# Patient Record
Sex: Female | Born: 1955 | Race: Black or African American | Hispanic: No | Marital: Married | Smoking: Never smoker
Health system: Southern US, Community
[De-identification: ages and names within clinical notes are randomized; demographics above are authoritative.]

## PROBLEM LIST (undated history)

## (undated) DIAGNOSIS — I10 Essential (primary) hypertension: Secondary | ICD-10-CM

## (undated) HISTORY — PX: COLONOSCOPY: SHX174

## (undated) HISTORY — DX: Essential (primary) hypertension: I10

## (undated) HISTORY — PX: BREAST BIOPSY: SHX20

---

## 1990-10-03 HISTORY — PX: TUBAL LIGATION: SHX77

## 1998-12-03 ENCOUNTER — Emergency Department (HOSPITAL_COMMUNITY): Admission: EM | Admit: 1998-12-03 | Discharge: 1998-12-03 | Payer: Self-pay | Admitting: Emergency Medicine

## 1998-12-03 ENCOUNTER — Encounter: Payer: Self-pay | Admitting: Emergency Medicine

## 2000-03-07 ENCOUNTER — Encounter: Payer: Self-pay | Admitting: Family Medicine

## 2000-03-07 ENCOUNTER — Ambulatory Visit (HOSPITAL_COMMUNITY): Admission: RE | Admit: 2000-03-07 | Discharge: 2000-03-07 | Payer: Self-pay | Admitting: Family Medicine

## 2000-03-15 ENCOUNTER — Encounter: Payer: Self-pay | Admitting: Family Medicine

## 2000-03-15 ENCOUNTER — Encounter: Admission: RE | Admit: 2000-03-15 | Discharge: 2000-03-15 | Payer: Self-pay | Admitting: Family Medicine

## 2003-03-05 ENCOUNTER — Ambulatory Visit (HOSPITAL_COMMUNITY): Admission: RE | Admit: 2003-03-05 | Discharge: 2003-03-05 | Payer: Self-pay | Admitting: Family Medicine

## 2003-03-05 ENCOUNTER — Encounter: Payer: Self-pay | Admitting: Family Medicine

## 2004-03-15 ENCOUNTER — Ambulatory Visit (HOSPITAL_COMMUNITY): Admission: RE | Admit: 2004-03-15 | Discharge: 2004-03-15 | Payer: Self-pay | Admitting: Family Medicine

## 2004-06-03 ENCOUNTER — Ambulatory Visit (HOSPITAL_COMMUNITY): Admission: RE | Admit: 2004-06-03 | Discharge: 2004-06-03 | Payer: Self-pay | Admitting: Oncology

## 2004-10-27 ENCOUNTER — Ambulatory Visit: Payer: Self-pay | Admitting: Family Medicine

## 2004-11-09 ENCOUNTER — Ambulatory Visit: Payer: Self-pay | Admitting: Family Medicine

## 2004-11-23 ENCOUNTER — Ambulatory Visit: Payer: Self-pay | Admitting: Family Medicine

## 2005-03-17 ENCOUNTER — Ambulatory Visit (HOSPITAL_COMMUNITY): Admission: RE | Admit: 2005-03-17 | Discharge: 2005-03-17 | Payer: Self-pay | Admitting: Family Medicine

## 2005-03-29 ENCOUNTER — Ambulatory Visit: Payer: Self-pay | Admitting: Family Medicine

## 2005-12-08 ENCOUNTER — Ambulatory Visit: Payer: Self-pay | Admitting: Family Medicine

## 2006-03-21 ENCOUNTER — Ambulatory Visit: Payer: Self-pay | Admitting: Internal Medicine

## 2006-03-30 ENCOUNTER — Encounter: Payer: Self-pay | Admitting: Family Medicine

## 2006-03-30 ENCOUNTER — Other Ambulatory Visit: Admission: RE | Admit: 2006-03-30 | Discharge: 2006-03-30 | Payer: Self-pay | Admitting: Family Medicine

## 2006-03-30 ENCOUNTER — Ambulatory Visit: Payer: Self-pay | Admitting: Family Medicine

## 2006-04-11 ENCOUNTER — Encounter (HOSPITAL_COMMUNITY): Admission: RE | Admit: 2006-04-11 | Discharge: 2006-05-17 | Payer: Self-pay | Admitting: Family Medicine

## 2006-06-06 ENCOUNTER — Ambulatory Visit: Payer: Self-pay | Admitting: Family Medicine

## 2006-07-18 ENCOUNTER — Ambulatory Visit: Payer: Self-pay | Admitting: Family Medicine

## 2006-07-20 ENCOUNTER — Ambulatory Visit (HOSPITAL_COMMUNITY): Admission: RE | Admit: 2006-07-20 | Discharge: 2006-07-20 | Payer: Self-pay | Admitting: Family Medicine

## 2006-08-08 ENCOUNTER — Encounter: Admission: RE | Admit: 2006-08-08 | Discharge: 2006-08-08 | Payer: Self-pay | Admitting: Family Medicine

## 2006-08-29 ENCOUNTER — Ambulatory Visit: Payer: Self-pay | Admitting: Family Medicine

## 2006-10-10 ENCOUNTER — Ambulatory Visit: Payer: Self-pay | Admitting: Family Medicine

## 2007-01-08 ENCOUNTER — Ambulatory Visit: Payer: Self-pay | Admitting: Family Medicine

## 2007-01-08 LAB — CONVERTED CEMR LAB
Basophils Absolute: 0 10*3/uL (ref 0.0–0.1)
Basophils Relative: 1 % (ref 0–1)
Ferritin: 9 ng/mL — ABNORMAL LOW (ref 10–291)
HCT: 36.1 % (ref 36.0–46.0)
Iron: 45 ug/dL (ref 42–145)
Lymphs Abs: 1.6 10*3/uL (ref 0.7–3.3)
MCHC: 31.9 g/dL (ref 30.0–36.0)
Neutro Abs: 1.8 10*3/uL (ref 1.7–7.7)
Neutrophils Relative %: 45 % (ref 43–77)
Platelets: 261 10*3/uL (ref 150–400)
RDW: 14 % (ref 11.5–14.0)
Retic Ct Pct: 1.7 % (ref 0.4–3.1)
Saturation Ratios: 12 % — ABNORMAL LOW (ref 20–55)
Vitamin B-12: 583 pg/mL (ref 211–911)

## 2007-01-30 ENCOUNTER — Encounter (HOSPITAL_COMMUNITY): Admission: RE | Admit: 2007-01-30 | Discharge: 2007-03-01 | Payer: Self-pay | Admitting: Oncology

## 2007-01-30 ENCOUNTER — Ambulatory Visit (HOSPITAL_COMMUNITY): Payer: Self-pay | Admitting: Oncology

## 2007-03-27 ENCOUNTER — Encounter (HOSPITAL_COMMUNITY): Admission: RE | Admit: 2007-03-27 | Discharge: 2007-04-26 | Payer: Self-pay | Admitting: Oncology

## 2007-04-02 ENCOUNTER — Ambulatory Visit (HOSPITAL_COMMUNITY): Payer: Self-pay | Admitting: Oncology

## 2007-04-03 ENCOUNTER — Other Ambulatory Visit: Admission: RE | Admit: 2007-04-03 | Discharge: 2007-04-03 | Payer: Self-pay | Admitting: Family Medicine

## 2007-04-03 ENCOUNTER — Encounter: Payer: Self-pay | Admitting: Family Medicine

## 2007-04-03 ENCOUNTER — Ambulatory Visit: Payer: Self-pay | Admitting: Family Medicine

## 2007-04-03 LAB — CONVERTED CEMR LAB
CO2: 24 meq/L (ref 19–32)
Calcium: 9.3 mg/dL (ref 8.4–10.5)
Chloride: 104 meq/L (ref 96–112)
Cholesterol: 169 mg/dL (ref 0–200)
Creatinine, Ser: 0.83 mg/dL (ref 0.40–1.20)
LDL Cholesterol: 94 mg/dL (ref 0–99)
Potassium: 3.7 meq/L (ref 3.5–5.3)
Triglycerides: 56 mg/dL (ref <150)

## 2007-08-03 ENCOUNTER — Ambulatory Visit (HOSPITAL_COMMUNITY): Admission: RE | Admit: 2007-08-03 | Discharge: 2007-08-03 | Payer: Self-pay | Admitting: Family Medicine

## 2007-10-26 ENCOUNTER — Encounter: Payer: Self-pay | Admitting: Family Medicine

## 2007-10-26 DIAGNOSIS — D259 Leiomyoma of uterus, unspecified: Secondary | ICD-10-CM | POA: Insufficient documentation

## 2007-10-26 DIAGNOSIS — I10 Essential (primary) hypertension: Secondary | ICD-10-CM | POA: Insufficient documentation

## 2007-10-26 DIAGNOSIS — E669 Obesity, unspecified: Secondary | ICD-10-CM | POA: Insufficient documentation

## 2008-04-24 ENCOUNTER — Other Ambulatory Visit: Admission: RE | Admit: 2008-04-24 | Discharge: 2008-04-24 | Payer: Self-pay | Admitting: Family Medicine

## 2008-04-24 ENCOUNTER — Encounter: Payer: Self-pay | Admitting: Family Medicine

## 2008-04-24 ENCOUNTER — Ambulatory Visit: Payer: Self-pay | Admitting: Family Medicine

## 2008-04-24 LAB — CONVERTED CEMR LAB: Pap Smear: NORMAL

## 2008-04-26 ENCOUNTER — Encounter: Payer: Self-pay | Admitting: Family Medicine

## 2008-04-26 LAB — CONVERTED CEMR LAB
Basophils Absolute: 0 10*3/uL (ref 0.0–0.1)
Calcium: 9 mg/dL (ref 8.4–10.5)
Cholesterol: 158 mg/dL (ref 0–200)
Creatinine, Ser: 0.9 mg/dL (ref 0.40–1.20)
Eosinophils Absolute: 0.2 10*3/uL (ref 0.0–0.7)
Glucose, Bld: 89 mg/dL (ref 70–99)
HCT: 32.8 % — ABNORMAL LOW (ref 36.0–46.0)
Lymphocytes Relative: 41 % (ref 12–46)
Lymphs Abs: 1.5 10*3/uL (ref 0.7–4.0)
Monocytes Absolute: 0.3 10*3/uL (ref 0.1–1.0)
Neutro Abs: 1.7 10*3/uL (ref 1.7–7.7)
Potassium: 3.9 meq/L (ref 3.5–5.3)
RBC: 3.84 M/uL — ABNORMAL LOW (ref 3.87–5.11)
RDW: 16.1 % — ABNORMAL HIGH (ref 11.5–15.5)
Total CHOL/HDL Ratio: 2.8
WBC: 3.7 10*3/uL — ABNORMAL LOW (ref 4.0–10.5)

## 2008-04-28 ENCOUNTER — Encounter: Payer: Self-pay | Admitting: Family Medicine

## 2008-04-28 LAB — CONVERTED CEMR LAB: Retic Ct Pct: 1.1 % (ref 0.4–3.1)

## 2008-05-01 ENCOUNTER — Encounter: Payer: Self-pay | Admitting: Family Medicine

## 2008-05-08 ENCOUNTER — Telehealth: Payer: Self-pay | Admitting: Family Medicine

## 2008-09-02 ENCOUNTER — Ambulatory Visit (HOSPITAL_COMMUNITY): Admission: RE | Admit: 2008-09-02 | Discharge: 2008-09-02 | Payer: Self-pay | Admitting: Family Medicine

## 2008-10-03 HISTORY — PX: MYOMECTOMY: SHX85

## 2009-05-11 ENCOUNTER — Telehealth: Payer: Self-pay | Admitting: Family Medicine

## 2009-06-22 ENCOUNTER — Encounter: Payer: Self-pay | Admitting: Family Medicine

## 2009-06-29 ENCOUNTER — Encounter: Payer: Self-pay | Admitting: Family Medicine

## 2009-06-29 ENCOUNTER — Other Ambulatory Visit: Admission: RE | Admit: 2009-06-29 | Discharge: 2009-06-29 | Payer: Self-pay | Admitting: Family Medicine

## 2009-06-29 ENCOUNTER — Ambulatory Visit: Payer: Self-pay | Admitting: Family Medicine

## 2009-06-29 LAB — CONVERTED CEMR LAB
BUN: 14 mg/dL (ref 6–23)
Basophils Absolute: 0 10*3/uL (ref 0.0–0.1)
Bilirubin Urine: NEGATIVE
Blood in Urine, dipstick: NEGATIVE
Calcium: 9.3 mg/dL (ref 8.4–10.5)
Chloride: 105 meq/L (ref 96–112)
Cholesterol: 172 mg/dL (ref 0–200)
Creatinine, Ser: 1.03 mg/dL (ref 0.40–1.20)
Glucose, Urine, Semiquant: NEGATIVE
HCT: 37.3 % (ref 36.0–46.0)
Lymphs Abs: 1.4 10*3/uL (ref 0.7–4.0)
Monocytes Absolute: 0.4 10*3/uL (ref 0.1–1.0)
Nitrite: NEGATIVE
OCCULT 1: NEGATIVE
Potassium: 4.1 meq/L (ref 3.5–5.3)
RBC: 4.31 M/uL (ref 3.87–5.11)
Sodium: 141 meq/L (ref 135–145)
TSH: 1.431 microintl units/mL (ref 0.350–4.500)
Triglycerides: 57 mg/dL (ref <150)
WBC Urine, dipstick: NEGATIVE
pH: 6.5

## 2009-07-01 ENCOUNTER — Encounter: Payer: Self-pay | Admitting: Family Medicine

## 2009-07-07 ENCOUNTER — Encounter: Payer: Self-pay | Admitting: Family Medicine

## 2009-08-20 ENCOUNTER — Encounter: Payer: Self-pay | Admitting: Family Medicine

## 2009-09-22 ENCOUNTER — Ambulatory Visit (HOSPITAL_COMMUNITY): Admission: RE | Admit: 2009-09-22 | Discharge: 2009-09-22 | Payer: Self-pay | Admitting: Family Medicine

## 2009-11-17 ENCOUNTER — Ambulatory Visit: Payer: Self-pay | Admitting: Family Medicine

## 2009-11-17 DIAGNOSIS — R5383 Other fatigue: Secondary | ICD-10-CM

## 2009-11-17 DIAGNOSIS — R5381 Other malaise: Secondary | ICD-10-CM | POA: Insufficient documentation

## 2009-11-25 ENCOUNTER — Encounter: Payer: Self-pay | Admitting: Family Medicine

## 2009-12-10 ENCOUNTER — Encounter: Payer: Self-pay | Admitting: Family Medicine

## 2009-12-10 ENCOUNTER — Ambulatory Visit (HOSPITAL_COMMUNITY): Admission: RE | Admit: 2009-12-10 | Discharge: 2009-12-10 | Payer: Self-pay | Admitting: Obstetrics and Gynecology

## 2010-01-12 ENCOUNTER — Encounter: Payer: Self-pay | Admitting: Family Medicine

## 2010-01-12 LAB — CONVERTED CEMR LAB
CO2: 26 meq/L (ref 19–32)
Calcium: 9.2 mg/dL (ref 8.4–10.5)
Cholesterol: 153 mg/dL (ref 0–200)
Creatinine, Ser: 0.88 mg/dL (ref 0.40–1.20)
HDL: 53 mg/dL (ref >39)
Total CHOL/HDL Ratio: 2.9
VLDL: 11 mg/dL (ref 0–40)

## 2010-01-19 ENCOUNTER — Ambulatory Visit: Payer: Self-pay | Admitting: Family Medicine

## 2010-02-15 ENCOUNTER — Telehealth: Payer: Self-pay | Admitting: Family Medicine

## 2010-02-17 ENCOUNTER — Encounter: Payer: Self-pay | Admitting: Family Medicine

## 2010-03-24 ENCOUNTER — Ambulatory Visit: Payer: Self-pay | Admitting: Family Medicine

## 2010-05-25 ENCOUNTER — Ambulatory Visit: Payer: Self-pay | Admitting: Family Medicine

## 2010-07-29 ENCOUNTER — Other Ambulatory Visit: Admission: RE | Admit: 2010-07-29 | Discharge: 2010-07-29 | Payer: Self-pay | Admitting: Family Medicine

## 2010-07-29 ENCOUNTER — Ambulatory Visit: Payer: Self-pay | Admitting: Family Medicine

## 2010-07-30 ENCOUNTER — Encounter: Payer: Self-pay | Admitting: Family Medicine

## 2010-07-30 ENCOUNTER — Telehealth (INDEPENDENT_AMBULATORY_CARE_PROVIDER_SITE_OTHER): Payer: Self-pay | Admitting: *Deleted

## 2010-07-30 LAB — CONVERTED CEMR LAB
Basophils Absolute: 0.1 10*3/uL (ref 0.0–0.1)
Basophils Relative: 1 % (ref 0–1)
Calcium: 9.5 mg/dL (ref 8.4–10.5)
Chloride: 102 meq/L (ref 96–112)
Creatinine, Ser: 0.9 mg/dL (ref 0.40–1.20)
FSH: 36.5 milliintl units/mL
HCT: 38.2 % (ref 36.0–46.0)
Hemoglobin: 12.8 g/dL (ref 12.0–15.0)
LH: 27.9 milliintl units/mL
MCV: 90.1 fL (ref 78.0–100.0)
Monocytes Relative: 8 % (ref 3–12)
Neutro Abs: 2.6 10*3/uL (ref 1.7–7.7)
Platelets: 206 10*3/uL (ref 150–400)
RBC: 4.24 M/uL (ref 3.87–5.11)
RDW: 13.9 % (ref 11.5–15.5)

## 2010-08-03 ENCOUNTER — Encounter: Payer: Self-pay | Admitting: Family Medicine

## 2010-08-03 ENCOUNTER — Ambulatory Visit (HOSPITAL_COMMUNITY): Admission: RE | Admit: 2010-08-03 | Discharge: 2010-08-03 | Payer: Self-pay | Admitting: Family Medicine

## 2010-08-03 LAB — CONVERTED CEMR LAB: Pap Smear: NEGATIVE

## 2010-08-04 ENCOUNTER — Encounter: Payer: Self-pay | Admitting: Family Medicine

## 2010-09-23 ENCOUNTER — Encounter
Admission: RE | Admit: 2010-09-23 | Discharge: 2010-09-23 | Payer: Self-pay | Source: Home / Self Care | Attending: Family Medicine | Admitting: Family Medicine

## 2010-10-24 ENCOUNTER — Encounter: Payer: Self-pay | Admitting: Family Medicine

## 2010-10-29 ENCOUNTER — Ambulatory Visit
Admission: RE | Admit: 2010-10-29 | Discharge: 2010-10-29 | Payer: Self-pay | Source: Home / Self Care | Attending: Family Medicine | Admitting: Family Medicine

## 2010-10-29 DIAGNOSIS — N950 Postmenopausal bleeding: Secondary | ICD-10-CM | POA: Insufficient documentation

## 2010-11-02 NOTE — Assessment & Plan Note (Signed)
Summary: office visit   Vital Signs:  Patient profile:   55 year old female Menstrual status:  irregular Height:      61 inches Weight:      195.50 pounds BMI:     37.07 O2 Sat:      100 % on Room air Pulse rate:   99 / minute Pulse rhythm:   regular Resp:     16 per minute BP sitting:   120 / 80  (left arm)  Vitals Entered By: Adella Hare LPN (May 25, 2010 4:47 PM)  Nutrition Counseling: Patient's BMI is greater than 25 and therefore counseled on weight management options.  O2 Flow:  Room air CC: follow-up visit Is Patient Diabetic? No Pain Assessment Patient in pain? no        CC:  follow-up visit.  History of Present Illness: Reports  thatshe has been doing well. she does state that she is aware that she is not exercising as she should and atributes this to rigorous wprk schedule. Denies recent fever or chills. Denies sinus pressure, nasal congestion , ear pain or sore throat. Denies chest congestion, or cough productive of sputum. Denies chest pain, palpitations, PND, orthopnea or leg swelling. Denies abdominal pain, nausea, vomitting, diarrhea or constipation. Denies change in bowel movements or bloody stool. Denies dysuria , frequency, incontinence or hesitancy. Denies  joint pain, swelling, or reduced mobility. Denies headaches, vertigo, seizures. Denies depression, anxiety or insomnia. Denies  rash, lesions, or itch.     Allergies (verified): 1)  ! Codeine  Review of Systems      See HPI General:  Complains of fatigue. Eyes:  Denies blurring and discharge. Endo:  Denies excessive thirst and excessive urination. Heme:  Denies abnormal bruising and bleeding. Allergy:  Denies hives or rash and itching eyes.  Physical Exam  General:  Well-developed,well-nourished,in no acute distress; alert,appropriate and cooperative throughout examination HEENT: No facial asymmetry,  EOMI, No sinus tenderness, TM's Clear, oropharynx  pink and moist.   Chest:  Clear to auscultation bilaterally.  CVS: S1, S2, No murmurs, No S3.   Abd: Soft, Nontender.  MS: Adequate ROM spine, hips, shoulders and knees.  Ext: No edema.   CNS: CN 2-12 intact, power tone and sensation normal throughout.   Skin: Intact, no visible lesions or rashes.  Psych: Good eye contact, normal affect.  Memory intact, not anxious or depressed appearing.    Impression & Recommendations:  Problem # 1:  OBESITY, UNSPECIFIED (ICD-278.00) Assessment Improved  Ht: 61 (05/25/2010)   Wt: 195.50 (05/25/2010)   BMI: 37.07 (05/25/2010)  Problem # 2:  ESSENTIAL HYPERTENSION, BENIGN (ICD-401.1) Assessment: Unchanged  Her updated medication list for this problem includes:    Amlodipine Besylate 10 Mg Tabs (Amlodipine besylate) ..... One tab by mouth once daily    Benazepril Hcl 40 Mg Tabs (Benazepril hcl) ..... One tab by mouth once daily  BP today: 120/80 Prior BP: 124/90 (03/24/2010)  Labs Reviewed: K+: 3.7 (01/12/2010) Creat: : 0.88 (01/12/2010)   Chol: 153 (01/12/2010)   HDL: 53 (01/12/2010)   LDL: 89 (01/12/2010)   TG: 56 (01/12/2010)  Complete Medication List: 1)  Aspirin 81 Mg Tbec (Aspirin) .... Take one tablet by mouth once a day 2)  Multivitamins Tabs (Multiple vitamin) .... One tab by mouth once daily 3)  Amlodipine Besylate 10 Mg Tabs (Amlodipine besylate) .... One tab by mouth once daily 4)  Benazepril Hcl 40 Mg Tabs (Benazepril hcl) .... One tab by mouth once daily 5)  Phentermine Hcl 37.5 Mg Tabs (Phentermine hcl) .... Take 1 tablet by mouth once a day  Patient Instructions: 1)  CPE early October. 2)    Congrats on weight loss pls keep it up. 3)  No med changes  at this time Prescriptions: PHENTERMINE HCL 37.5 MG TABS (PHENTERMINE HCL) Take 1 tablet by mouth once a day  #30 x 1   Entered and Authorized by:   Syliva Overman MD   Signed by:   Syliva Overman MD on 05/25/2010   Method used:   Printed then faxed to ...       [facility]  (retail)       [address]       [place], [place]  [id number]       Ph: [phone]       Fax: [phone]   RxID:   [id number]

## 2010-11-02 NOTE — Assessment & Plan Note (Signed)
Summary: physical   Vital Signs:  Patient profile:   55 year old female Menstrual status:  irregular Height:      61 inches Weight:      193.75 pounds BMI:     36.74 O2 Sat:      98 % on Room air Pulse rate:   99 / minute Pulse rhythm:   regular Resp:     16 per minute BP sitting:   122 / 82  (left arm)  Vitals Entered By: Mauricia Area CMA (July 29, 2010 9:58 AM)  Nutrition Counseling: Patient's BMI is greater than 25 and therefore counseled on weight management options.  O2 Flow:  Room air CC: physical  Vision Screening:Left eye w/o correction: 20 / 25 Right Eye w/o correction: 20 / 40 Both eyes w/o correction:  20/ 25        Vision Entered By: Mauricia Area CMA (July 29, 2010 10:05 AM)   CC:  physical.  History of Present Illness: Reports  thatshe has been  doing well. She is concerned about overwork and little to no time spent on exercise and healthy eating to facilitate weight loss. Denies recent fever or chills. Denies sinus pressure, nasal congestion , ear pain or sore throat. Denies chest congestion, or cough productive of sputum. Denies chest pain, palpitations, PND, orthopnea or leg swelling. Denies abdominal pain, nausea, vomitting, diarrhea or constipation. Denies change in bowel movements or bloody stool. Denies dysuria , frequency, incontinence or hesitancy. Denies  joint pain, swelling, or reduced mobility. Denies headaches, vertigo, seizures. Denies depression, anxiety or insomnia. Denies  rash, lesions, or itch.     Current Medications (verified): 1)  Aspirin 81 Mg  Tbec (Aspirin) .... Take One Tablet By Mouth Once A Day 2)  Multivitamins   Tabs (Multiple Vitamin) .... One Tab By Mouth Once Daily 3)  Amlodipine Besylate 10 Mg  Tabs (Amlodipine Besylate) .... One Tab By Mouth Once Daily 4)  Benazepril Hcl 40 Mg  Tabs (Benazepril Hcl) .... One Tab By Mouth Once Daily 5)  Phentermine Hcl 37.5 Mg Tabs (Phentermine Hcl) .... Take 1 Tablet By  Mouth Once A Day  Allergies (verified): 1)  ! Codeine  Review of Systems      See HPI Eyes:  Denies discharge, double vision, eye pain, halos, and red eye. GU:  Complains of abnormal vaginal bleeding. Endo:  Denies cold intolerance, excessive hunger, excessive thirst, and excessive urination. Heme:  Denies abnormal bruising, bleeding, enlarge lymph nodes, and pallor. Allergy:  Denies hives or rash, itching eyes, persistent infections, and seasonal allergies.  Physical Exam  General:  Well-developed,well-nourished,in no acute distress; alert,appropriate and cooperative throughout examination Head:  Normocephalic and atraumatic without obvious abnormalities. No apparent alopecia or balding. Eyes:  No corneal or conjunctival inflammation noted. EOMI. Perrla. Funduscopic exam benign, without hemorrhages, exudates or papilledema. Vision grossly normal. Ears:  External ear exam shows no significant lesions or deformities.  Otoscopic examination reveals clear canals, tympanic membranes are intact bilaterally without bulging, retraction, inflammation or discharge. Hearing is grossly normal bilaterally. Nose:  External nasal examination shows no deformity or inflammation. Nasal mucosa are pink and moist without lesions or exudates. Mouth:  Oral mucosa and oropharynx without lesions or exudates.  Teeth in good repair. Neck:  No deformities, masses, or tenderness noted. Chest Wall:  No deformities, masses, or tenderness noted. Breasts:  No mass, nodules, thickening, tenderness, bulging, retraction, inflamation, nipple discharge or skin changes noted.   Lungs:  Normal respiratory effort, chest  expands symmetrically. Lungs are clear to auscultation, no crackles or wheezes. Heart:  Normal rate and regular rhythm. S1 and S2 normal without gallop, murmur, click, rub or other extra sounds. Abdomen:  Bowel sounds positive,abdomen soft and non-tender without masses, organomegaly or hernias noted. Rectal:  No  external abnormalities noted. Normal sphincter tone. No rectal masses or tenderness. Genitalia:  Normal introitus for age, no external lesions, no vaginal discharge, mucosa pink and moist, no vaginal or cervical lesions, no vaginal atrophy, no friaility or hemorrhage,uterus enlarged, no adnexal masses or tenderness Msk:  No deformity or scoliosis noted of thoracic or lumbar spine.   Pulses:  R and L carotid,radial,femoral,dorsalis pedis and posterior tibial pulses are full and equal bilaterally Extremities:  No clubbing, cyanosis, edema, or deformity noted with normal full range of motion of all joints.   Neurologic:  No cranial nerve deficits noted. Station and gait are normal. Plantar reflexes are down-going bilaterally. DTRs are symmetrical throughout. Sensory, motor and coordinative functions appear intact. Skin:  Intact without suspicious lesions or rashes Cervical Nodes:  No lymphadenopathy noted Axillary Nodes:  No palpable lymphadenopathy Inguinal Nodes:  No significant adenopathy Psych:  Cognition and judgment appear intact. Alert and cooperative with normal attention span and concentration. No apparent delusions, illusions, hallucinations   Impression & Recommendations:  Problem # 1:  OBESITY, UNSPECIFIED (ICD-278.00) Assessment Improved  Ht: 61 (07/29/2010)   Wt: 193.75 (07/29/2010)   BMI: 36.74 (07/29/2010) therapeutic lifestyle change discussed and encouraged  Problem # 2:  ESSENTIAL HYPERTENSION, BENIGN (ICD-401.1) Assessment: Unchanged  Her updated medication list for this problem includes:    Amlodipine Besylate 10 Mg Tabs (Amlodipine besylate) ..... One tab by mouth once daily    Benazepril Hcl 40 Mg Tabs (Benazepril hcl) ..... One tab by mouth once daily  Orders: T-Basic Metabolic Panel [phone])  BP today: 122/82 Prior BP: 120/80 (05/25/2010)  Labs Reviewed: K+: 3.7 (01/12/2010) Creat: : 0.88 (01/12/2010)   Chol: 153 (01/12/2010)   HDL: 53 (01/12/2010)    LDL: 89 (01/12/2010)   TG: 56 (01/12/2010)  Problem # 3:  FIBROIDS, UTERUS (ICD-218.9) Assessment: Comment Only  Orders: Radiology Referral (Radiology)  Complete Medication List: 1)  Aspirin 81 Mg Tbec (Aspirin) .... Take one tablet by mouth once a day 2)  Multivitamins Tabs (Multiple vitamin) .... One tab by mouth once daily 3)  Amlodipine Besylate 10 Mg Tabs (Amlodipine besylate) .... One tab by mouth once daily 4)  Benazepril Hcl 40 Mg Tabs (Benazepril hcl) .... One tab by mouth once daily 5)  Phentermine Hcl 37.5 Mg Tabs (Phentermine hcl) .... Take 1 tablet by mouth once a day  Other Orders: T-CBC w/Diff ([id number]) T- Hemoglobin A1C (29562-13086) T-FSH [phone]) T-LH [phone]) T-Vitamin D (25-Hydroxy) [phone]) Influenza Vaccine NON MCR (03474) Pap Smear (25956)  Patient Instructions: 1)  Please schedule a follow-up appointment in 3 months. 2)  It is important that you exercise regularly at least 30 minutes 5 times a week. If you develop chest pain, have severe difficulty breathing, or feel very tired , stop exercising immediately and seek medical attention. 3)  You need to lose weight. Consider a lower calorie diet and regular exercise. You will be referred for a pelvic US and a mamogram. 4)  Fsasting labs today  Prescriptions: PHENTERMINE HCL 37.5 MG TABS (PHENTERMINE HCL) Take 1 tablet by mouth once a day  #30 x 2   Entered by:   Adella Hare LPN   Authorized by:   Syliva Overman MD  Signed by:   Adella Hare LPN on 04/54/0981   Method used:   Printed then faxed to ...       [facility] [address] (retail)       [address]       [place], [place]  [id number]       Ph: [phone]       Fax: [phone]   RxID:   [id number] PHENTERMINE HCL 37.5 MG TABS (PHENTERMINE HCL) Take 1 tablet by mouth once a day  #30 x 2   Entered by:   Adella Hare LPN   Authorized by:   Syliva Overman MD   Signed by:   Adella Hare LPN on  40/34/7425   Method used:   Printed then faxed to ...       [facility] [address] (retail)       [address]       [place], [place]  [id number]       Ph: [phone]       Fax: [phone]   RxID:   [id number] PHENTERMINE HCL 37.5 MG TABS (PHENTERMINE HCL) Take 1 tablet by mouth once a day  #30 x 2   Entered by:   Adella Hare LPN   Authorized by:   Syliva Overman MD   Signed by:   Adella Hare LPN on 70/62/3762   Method used:   Printed then faxed to ...       [facility] [address] [address] (retail)       [address]       [place], [place]  [id number]       Ph: [phone]       Fax: [phone]   RxID:   [id number]  PRINTER DIFFICULTY*****  Orders Added: 1)  Est. Patient 40-64 years [99396] 2)  T-Basic Metabolic Panel [id number] 3)  T-CBC w/Diff [10258-52778] 4)  T- Hemoglobin A1C [83036-23375] 5)  T-FSH [83001-23670] 6)  T-LH [83002-23680] 7)  T-Vitamin D (25-Hydroxy) [24235-36144] 8)  Influenza Vaccine NON MCR [00028] 9)  Pap Smear [88150] 10)  Radiology Referral [Radiology] 11)  Radiology Referral [Radiology]   Immunizations Administered:  Influenza Vaccine # 1:    Vaccine Type: Fluvax Non-MCR    Site: left deltoid    Mfr: novartis    Dose: 0.5 ml    Route: IM    Given by: Adella Hare LPN    Exp. Date: 02/2011    Lot #: [id number]    VIS given: 04/27/10 version given July 29, 2010.   Immunizations Administered:  Influenza Vaccine # 1:    Vaccine Type: Fluvax Non-MCR    Site: left deltoid    Mfr: novartis    Dose: 0.5 ml    Route: IM    Given by: Adella Hare LPN    Exp. Date: 02/2011    Lot #: [id number]    VIS given: 04/27/10 version given July 29, 2010.

## 2010-11-02 NOTE — Op Note (Signed)
Summary: MENORRHAGIA/ UTERINE FIBROIDS  MENORRHAGIA/ UTERINE FIBROIDS   Imported By: Lind Guest 03/25/2010 14:00:36  _____________________________________________________________________  External Attachment:    Type:   Image     Comment:   External Document

## 2010-11-02 NOTE — Progress Notes (Signed)
Summary: [facility]   Imported By: Lind Guest 01/20/2010 15:04:13  _____________________________________________________________________  External Attachment:    Type:   Image     Comment:   External Document

## 2010-11-02 NOTE — Letter (Signed)
Summary: Letter  Letter   Imported By: Lind Guest 02/17/2010 14:06:57  _____________________________________________________________________  External Attachment:    Type:   Image     Comment:   External Document

## 2010-11-02 NOTE — Progress Notes (Signed)
Summary: Ultrasound appointment  Phone Note Outgoing Call   Summary of Call: I called Mrs. Carbin to inform her of an appointment at [facility] on Tuesday Nov. 1 she needs to arrive at 10:30 for her ultrasound.  there was no answer will try to call back. Initial call taken by: Curtis Sites,  July 30, 2010 12:06 PM  Follow-up for Phone Call        patient aware Follow-up by: Adella Hare LPN,  July 30, 2010 2:30 PM

## 2010-11-02 NOTE — Miscellaneous (Signed)
Summary: Orders Update  Clinical Lists Changes  Orders: Added new Test order of Ultrasound (Ultrasound) - Signed 

## 2010-11-02 NOTE — Letter (Signed)
Summary: Letter  Letter   Imported By: Lind Guest 08/04/2010 17:15:25  _____________________________________________________________________  External Attachment:    Type:   Image     Comment:   External Document

## 2010-11-02 NOTE — Assessment & Plan Note (Signed)
Summary: f up   Vital Signs:  Patient profile:   55 year old female Menstrual status:  irregular Height:      61 inches Weight:      198.75 pounds BMI:     37.69 O2 Sat:      97 % Pulse rate:   88 / minute Pulse rhythm:   regular Resp:     16 per minute BP sitting:   124 / 90  (left arm) Cuff size:   large  Vitals Entered By: Everitt Amber LPN (March 24, 2010 3:59 PM)  Nutrition Counseling: Patient's BMI is greater than 25 and therefore counseled on weight management options. CC: Follow up chronic problems   CC:  Follow up chronic problems.  History of Present Illness: Reports  that she has been  doing well. Denies recent fever or chills. Denies sinus pressure, nasal congestion , ear pain or sore throat. Denies chest congestion, or cough productive of sputum. Denies chest pain, palpitations, PND, orthopnea or leg swelling. Denies abdominal pain, nausea, vomitting, diarrhea or constipation. Denies change in bowel movements or bloody stool. Denies dysuria , frequency, incontinence or hesitancy. Denies  joint pain, swelling, or reduced mobility. Denies headaches, vertigo, seizures. Denies depression, anxiety or insomnia. Denies  rash, lesions, or itch. Sh e unfortunately has stopped losing weight and she attributesd this to the fact that she has not been as diligent with exercise , also that she has no phentermine which she wants to resume.     Current Medications (verified): 1)  Aspirin 81 Mg  Tbec (Aspirin) .... Take One Tablet By Mouth Once A Day 2)  Multivitamins   Tabs (Multiple Vitamin) .... One Tab By Mouth Once Daily 3)  Amlodipine Besylate 10 Mg  Tabs (Amlodipine Besylate) .... One Tab By Mouth Once Daily 4)  Benazepril Hcl 40 Mg  Tabs (Benazepril Hcl) .... One Tab By Mouth Once Daily 5)  Phentermine Hcl 37.5 Mg Tabs (Phentermine Hcl) .... Take 1 Tablet By Mouth Once A Day  Allergies (verified): 1)  ! Codeine  Review of Systems      See HPI Eyes:  Denies  blurring, discharge, eye pain, and red eye. Endo:  Denies cold intolerance, excessive hunger, excessive thirst, excessive urination, heat intolerance, polyuria, and weight change. Heme:  Denies abnormal bruising and bleeding. Allergy:  Denies hives or rash and itching eyes.  Physical Exam  General:  Well-developedOBESE,in no acute distress; alert,appropriate and cooperative throughout examination HEENT: No facial asymmetry,  EOMI, No sinus tenderness, TM's Clear, oropharynx  pink and moist.   Chest: Clear to auscultation bilaterally.  CVS: S1, S2, No murmurs, No S3.   Abd: Soft, Nontender.  MS: Adequate ROM spine, hips, shoulders and knees.  Ext: No edema.   CNS: CN 2-12 intact, power tone and sensation normal throughout.   Skin: Intact, no visible lesions or rashes.  Psych: Good eye contact, normal affect.  Memory intact, not anxious or depressed appearing.    Impression & Recommendations:  Problem # 1:  OBESITY, UNSPECIFIED (ICD-278.00) Assessment Unchanged  Ht: 61 (03/24/2010)   Wt: 198.75 (03/24/2010)   BMI: 37.69 (03/24/2010)  Problem # 2:  ESSENTIAL HYPERTENSION, BENIGN (ICD-401.1) Assessment: Unchanged  Her updated medication list for this problem includes:    Amlodipine Besylate 10 Mg Tabs (Amlodipine besylate) ..... One tab by mouth once daily    Benazepril Hcl 40 Mg Tabs (Benazepril hcl) ..... One tab by mouth once daily  BP today: 124/90 Prior BP: 136/94 (01/19/2010)  Labs Reviewed: K+: 3.7 (01/12/2010) Creat: : 0.88 (01/12/2010)   Chol: 153 (01/12/2010)   HDL: 53 (01/12/2010)   LDL: 89 (01/12/2010)   TG: 56 (01/12/2010)  Complete Medication List: 1)  Aspirin 81 Mg Tbec (Aspirin) .... Take one tablet by mouth once a day 2)  Multivitamins Tabs (Multiple vitamin) .... One tab by mouth once daily 3)  Amlodipine Besylate 10 Mg Tabs (Amlodipine besylate) .... One tab by mouth once daily 4)  Benazepril Hcl 40 Mg Tabs (Benazepril hcl) .... One tab by mouth once  daily 5)  Phentermine Hcl 37.5 Mg Tabs (Phentermine hcl) .... Take 1 tablet by mouth once a day  Patient Instructions: 1)  Please schedule a follow-up appointment in 2 months. 2)  It is important that you exercise regularly at least 20 minutes 5 times a week. If you develop chest pain, have severe difficulty breathing, or feel very tired , stop exercising immediately and seek medical attention. 3)  You need to lose weight. Consider a lower calorie diet and regular exercise.  4)  Pls start taking phentermine HALF tablet daily Prescriptions: PHENTERMINE HCL 37.5 MG TABS (PHENTERMINE HCL) Take 1 tablet by mouth once a day  #30 x 0   Entered and Authorized by:   Syliva Overman MD   Signed by:   Syliva Overman MD on 03/24/2010   Method used:   Print then Give to Patient   RxID:   [id number]

## 2010-11-02 NOTE — Assessment & Plan Note (Signed)
Summary: office visit   Vital Signs:  Patient profile:   55 year old female Menstrual status:  irregular Height:      61 inches Weight:      208.50 pounds BMI:     39.54 O2 Sat:      98 % Pulse rate:   98 / minute Pulse rhythm:   regular Resp:     16 per minute BP sitting:   120 / 84  (left arm) Cuff size:   large  Vitals Entered By: Everitt Amber (November 17, 2009 10:12 AM)  Nutrition Counseling: Patient's BMI is greater than 25 and therefore counseled on weight management options. CC: Follow up chronic problems Is Patient Diabetic? Yes Pain Assessment Patient in pain? no        CC:  Follow up chronic problems.  History of Present Illness: Reports  that she has been  doing well. she is concerned about excessive weight gain in the pasrt 4 to 6 months. She wants an apetite suppressant, and plans to start regular exercise. Since her last visit she has been to gynae and is on depolupron , AND HAS HAD NO RECENT FLOODING AND CLOTTING. Denies recent fever or chills. Denies sinus pressure, nasal congestion , ear pain or sore throat. Denies chest congestion, or cough productive of sputum. Denies chest pain, palpitations, PND, orthopnea or leg swelling. Denies abdominal pain, nausea, vomitting, diarrhea or constipation. Denies change in bowel movements or bloody stool. Denies dysuria , frequency, incontinence or hesitancy. Denies  joint pain, swelling, or reduced mobility. Denies headaches, vertigo, seizures. Denies depression, anxiety or insomnia. Denies  rash, lesions, or itch.     Current Medications (verified): 1)  Aspirin 81 Mg  Tbec (Aspirin) .... Take One Tablet By Mouth Once A Day 2)  Ferrous Sulfate 325 (65 Fe) Mg  Tabs (Ferrous Sulfate) .... One Tab By Mouth Once Daily 3)  Multivitamins   Tabs (Multiple Vitamin) .... One Tab By Mouth Once Daily 4)  Amlodipine Besylate 10 Mg  Tabs (Amlodipine Besylate) .... One Tab By Mouth Once Daily 5)  Benazepril Hcl 40 Mg   Tabs (Benazepril Hcl) .... One Tab By Mouth Once Daily 6)  Phentermine Hcl 37.5 Mg Tabs (Phentermine Hcl) .... Take 1 Tablet By Mouth Once A Day 7)  Lupron Depot 3.75 Mg Kit (Leuprolide Acetate) .... Inject Once Per Month  Allergies (verified): 1)  ! Codeine  Review of Systems      See HPI Eyes:  Denies blurring and discharge. Endo:  Denies cold intolerance, excessive hunger, excessive thirst, excessive urination, heat intolerance, polyuria, and weight change. Heme:  Denies abnormal bruising and bleeding. Allergy:  Denies hives or rash.  Physical Exam  General:  Well-developedOBESE,in no acute distress; alert,appropriate and cooperative throughout examination HEENT: No facial asymmetry,  EOMI, No sinus tenderness, TM's Clear, oropharynx  pink and moist.   Chest: Clear to auscultation bilaterally.  CVS: S1, S2, No murmurs, No S3.   Abd: Soft, Nontender.  MS: Adequate ROM spine, hips, shoulders and knees.  Ext: No edema.   CNS: CN 2-12 intact, power tone and sensation normal throughout.   Skin: Intact, no visible lesions or rashes.  Psych: Good eye contact, normal affect.  Memory intact, not anxious or depressed appearing.    Impression & Recommendations:  Problem # 1:  OBESITY, UNSPECIFIED (ICD-278.00) Assessment Deteriorated  Ht: 61 (11/17/2009)   Wt: 208.50 (11/17/2009)   BMI: 39.54 (11/17/2009) PT TOSTART PHENTERMINE HALF DAILY AND REGULAR EXERCISE ALSO TO  CHANGE HER DIET   Problem # 2:  ESSENTIAL HYPERTENSION, BENIGN (ICD-401.1) Assessment: Unchanged  Her updated medication list for this problem includes:    Amlodipine Besylate 10 Mg Tabs (Amlodipine besylate) ..... One tab by mouth once daily    Benazepril Hcl 40 Mg Tabs (Benazepril hcl) ..... One tab by mouth once daily  Orders: T-Basic Metabolic Panel ([id number])  BP today: 120/84 Prior BP: 120/84 (06/29/2009)  Labs Reviewed: K+: 4.1 (06/29/2009) Creat: : 1.03 (06/29/2009)   Chol: 172 (06/29/2009)   HDL:  54 (06/29/2009)   LDL: 107 (06/29/2009)   TG: 57 (06/29/2009)  Complete Medication List: 1)  Aspirin 81 Mg Tbec (Aspirin) .... Take one tablet by mouth once a day 2)  Ferrous Sulfate 325 (65 Fe) Mg Tabs (Ferrous sulfate) .... One tab by mouth once daily 3)  Multivitamins Tabs (Multiple vitamin) .... One tab by mouth once daily 4)  Amlodipine Besylate 10 Mg Tabs (Amlodipine besylate) .... One tab by mouth once daily 5)  Benazepril Hcl 40 Mg Tabs (Benazepril hcl) .... One tab by mouth once daily 6)  Phentermine Hcl 37.5 Mg Tabs (Phentermine hcl) .... Take 1 tablet by mouth once a day 7)  Lupron Depot 3.75 Mg Kit (Leuprolide acetate) .... Inject once per month  Other Orders: T-Lipid Profile ([id number]) T-TSH [id number])  Patient Instructions: 1)  Please schedule a follow-up appointment in 2 months. 2)  It is important that you exercise regularly at least 30 minutes 5 times a week. If you develop chest pain, have severe difficulty breathing, or feel very tired , stop exercising immediately and seek medical attention. 3)  You need to lose weight. Consider a lower calorie diet and regular exercise.  4)  Start phentermine HALF tablet every morning 5)  Lipid Panel prior to visit, ICD-9:   6)  TSH prior to visit, ICD-9:   fasting in 2 month 7)  BMP prior to visit, ICD-9: Prescriptions: BENAZEPRIL HCL 40 MG  TABS (BENAZEPRIL HCL) one tab by mouth once daily  #30 Tablet x 4   Entered by:   Everitt Amber LPN   Authorized by:   Syliva Overman MD   Signed by:   Everitt Amber LPN on 29/52/8413   Method used:   Electronically to        [facility] (retail)       [address]       [place], [place]  [id number]       Ph: [phone]       Fax: [phone]   RxID:   [id number] AMLODIPINE BESYLATE 10 MG  TABS (AMLODIPINE BESYLATE) one tab by mouth once daily  #30 Tablet x 4   Entered by:   Everitt Amber LPN   Authorized by:   Syliva Overman MD   Signed by:   Everitt Amber LPN on 30/16/0109   Method used:   Electronically to        [facility] (retail)       [address]       [place], [place]  [id number]       Ph: [phone]       Fax: [phone]   RxID:   [id number] PHENTERMINE HCL 37.5 MG TABS (PHENTERMINE HCL) Take 1 tablet by mouth once a day  #30 x 0   Entered and Authorized by:   Syliva Overman MD   Signed by:   Syliva Overman MD on 11/17/2009   Method used:  Print then Give to Patient   RxID:   [id number]

## 2010-11-02 NOTE — Assessment & Plan Note (Signed)
Summary: office visit   Vital Signs:  Patient profile:   55 year old female Menstrual status:  irregular Height:      61 inches Weight:      198 pounds BMI:     37.55 O2 Sat:      99 % Pulse rate:   96 / minute Pulse rhythm:   regular Resp:     16 per minute BP sitting:   136 / 94  (left arm) Cuff size:   large  Vitals Entered By: Everitt Amber LPN (January 19, 2010 9:32 AM)  Nutrition Counseling: Patient's BMI is greater than 25 and therefore counseled on weight management options. CC: Follow up chronic problems   CC:  Follow up chronic problems.  History of Present Illness: Reports  that she has been  doing well. She is extremely pleased with her weight loss, denies any adverse side effects from phentermine and wants to continue same.She is not yet exercising as regularly as she should. Denies recent fever or chills. Denies sinus pressure, nasal congestion , ear pain or sore throat. Denies chest congestion, or cough productive of sputum. Denies chest pain, palpitations, PND, orthopnea or leg swelling. Denies abdominal pain, nausea, vomitting, diarrhea or constipation. Denies change in bowel movements or bloody stool. Denies dysuria , frequency, incontinence or hesitancy. Denies  joint pain, swelling, or reduced mobility. Denies headaches, vertigo, seizures. Denies depression, anxiety or insomnia. Denies  rash, lesions, or itch.     Current Medications (verified): 1)  Aspirin 81 Mg  Tbec (Aspirin) .... Take One Tablet By Mouth Once A Day 2)  Multivitamins   Tabs (Multiple Vitamin) .... One Tab By Mouth Once Daily 3)  Amlodipine Besylate 10 Mg  Tabs (Amlodipine Besylate) .... One Tab By Mouth Once Daily 4)  Benazepril Hcl 40 Mg  Tabs (Benazepril Hcl) .... One Tab By Mouth Once Daily 5)  Phentermine Hcl 37.5 Mg Tabs (Phentermine Hcl) .... Take 1 Tablet By Mouth Once A Day  Allergies (verified): 1)  ! Codeine  Review of Systems      See HPI Eyes:  Denies blurring,  discharge, eye pain, and red eye. GU:  Complains of abnormal vaginal bleeding; improved, with much lighter menses since ablation. Endo:  Denies cold intolerance, excessive hunger, excessive thirst, excessive urination, heat intolerance, polyuria, and weight change. Heme:  Denies abnormal bruising and bleeding. Allergy:  Denies hives or rash and itching eyes.  Physical Exam  General:  Well-developedOBESE,in no acute distress; alert,appropriate and cooperative throughout examination HEENT: No facial asymmetry,  EOMI, No sinus tenderness, TM's Clear, oropharynx  pink and moist.   Chest: Clear to auscultation bilaterally.  CVS: S1, S2, No murmurs, No S3.   Abd: Soft, Nontender.  MS: Adequate ROM spine, hips, shoulders and knees.  Ext: No edema.   CNS: CN 2-12 intact, power tone and sensation normal throughout.   Skin: Intact, no visible lesions or rashes.  Psych: Good eye contact, normal affect.  Memory intact, not anxious or depressed appearing.    Impression & Recommendations:  Problem # 1:  OBESITY, UNSPECIFIED (ICD-278.00) Assessment Improved  Ht: 61 (01/19/2010)   Wt: 198 (01/19/2010)   BMI: 37.55 (01/19/2010)  Problem # 2:  ESSENTIAL HYPERTENSION, BENIGN (ICD-401.1) Assessment: Deteriorated  Her updated medication list for this problem includes:    Amlodipine Besylate 10 Mg Tabs (Amlodipine besylate) ..... One tab by mouth once daily    Benazepril Hcl 40 Mg Tabs (Benazepril hcl) ..... One tab by mouth once daily,  imptance of exercise, reduced sodium diet and weight loss stressed  BP today: 136/94 Prior BP: 120/84 (11/17/2009)  Labs Reviewed: K+: 3.7 (01/12/2010) Creat: : 0.88 (01/12/2010)   Chol: 153 (01/12/2010)   HDL: 53 (01/12/2010)   LDL: 89 (01/12/2010)   TG: 56 (01/12/2010)  Problem # 3:  FIBROIDS, UTERUS (ICD-218.9) Assessment: Improved improved symptoms post ablation  Complete Medication List: 1)  Aspirin 81 Mg Tbec (Aspirin) .... Take one tablet by mouth  once a day 2)  Multivitamins Tabs (Multiple vitamin) .... One tab by mouth once daily 3)  Amlodipine Besylate 10 Mg Tabs (Amlodipine besylate) .... One tab by mouth once daily 4)  Benazepril Hcl 40 Mg Tabs (Benazepril hcl) .... One tab by mouth once daily 5)  Phentermine Hcl 37.5 Mg Tabs (Phentermine hcl) .... Take 1 tablet by mouth once a day  Patient Instructions: 1)  Please schedule a follow-up appointment in 2 months. 2)  It is important that you exercise regularly at least 30 minutes 6 times a week. If you develop chest pain, have severe difficulty breathing, or feel very tired , stop exercising immediately and seek medical attention. 3)  You need to lose weight. Consider a lower calorie diet and regular exercise.   Congrats , keep it up 4)  no med changes at this time

## 2010-11-02 NOTE — Letter (Signed)
Summary: Pap Smear, Normal Letter, [facility]  [address]   [place], [place] [address]   Phone: [phone]  Fax: [phone]          August 03, 2010    Dear: Megan Morales    I am pleased to notify you that your PAP smear was normal.  You will need your next PAP smear in:     ____ 3 Months    ____ 6 Months    _***_ 12 Months    Please call the office at our office number above, to schedule your next appointment.    Sincerely,    Adella Hare, LPN [facility]

## 2010-11-02 NOTE — Progress Notes (Signed)
  Phone Note Call from Patient   Caller: Patient Summary of Call: patient called wanting dr Martavis Gurney to take her daughter (age 54) bcbs insurance, as new patient, advised dr Willisha Sligar not accepting new patient but our PA is and would be happy to have her. ms Street[facility] declined stating she would look somewhere else.  Initial call taken by: Adella Hare LPN,  Feb 15, 2010 3:24 PM  Follow-up for Phone Call        pls sced the pt's daughter new with me  Follow-up by: Syliva Overman MD,  Feb 15, 2010 4:16 PM  Additional Follow-up for Phone Call Additional follow up Details #1::        CALLED THE VOICE MAIL IS FULL WILL TRY AGIAN TOMORROW Additional Follow-up by: Lind Guest,  Feb 15, 2010 4:37 PM    Additional Follow-up for Phone Call Additional follow up Details #2::    voice mail box full Follow-up by: Lind Guest,  Feb 17, 2010 1:39 PM  Additional Follow-up for Phone Call Additional follow up Details #3:: Details for Additional Follow-up Action Taken: sent letter to call to schedule her daughter Additional Follow-up by: Lind Guest,  Feb 17, 2010 1:44 PM

## 2010-11-05 DIAGNOSIS — L723 Sebaceous cyst: Secondary | ICD-10-CM | POA: Insufficient documentation

## 2010-11-10 NOTE — Assessment & Plan Note (Signed)
Summary: f up   Vital Signs:  Patient profile:   55 year old female Menstrual status:  irregular Height:      61 inches Weight:      195.75 pounds BMI:     37.12 O2 Sat:      97 % on Room air Pulse rate:   71 / minute Pulse rhythm:   regular Resp:     16 per minute BP sitting:   130 / 90  (left arm)  Vitals Entered By: Adella Hare LPN (October 29, 2010 9:31 AM)  Nutrition Counseling: Patient's BMI is greater than 25 and therefore counseled on weight management options.  O2 Flow:  Room air CC: follow-up visit Is Patient Diabetic? No   CC:  follow-up visit.  History of Present Illness: post meno bleed in sept for 5 days and 2 days in Dec, uS`shows prob nl endometrioal stripe , but pt has had ablation, will refer to gynae Reports  thatshe has otherwise been well. She is disappointed with her lack of weight loss.She intends to be more diligentabout this Denies recent fever or chills. Denies sinus pressure, nasal congestion , ear pain or sore throat. Denies chest congestion, or cough productive of sputum. Denies chest pain, palpitations, PND, orthopnea or leg swelling. Denies abdominal pain, nausea, vomitting, diarrhea or constipation. Denies change in bowel movements or bloody stool. Denies dysuria , frequency, incontinence or hesitancy. Denies  joint pain, swelling, or reduced mobility. Denies headaches, vertigo, seizures. Denies depression, anxiety or insomnia. Denies  rash, lesions, or itch.     Current Medications (verified): 1)  Aspirin 81 Mg  Tbec (Aspirin) .... Take One Tablet By Mouth Once A Day 2)  Multivitamins   Tabs (Multiple Vitamin) .... One Tab By Mouth Once Daily 3)  Amlodipine Besylate 10 Mg  Tabs (Amlodipine Besylate) .... One Tab By Mouth Once Daily 4)  Benazepril Hcl 40 Mg  Tabs (Benazepril Hcl) .... One Tab By Mouth Once Daily  Allergies (verified): 1)  ! Codeine  Review of Systems      See HPI Eyes:  Denies blurring and discharge. GU:   Complains of abnormal vaginal bleeding. Derm:  Complains of lesion(s); right post chest lesion x 1 week, draining. Endo:  Denies cold intolerance, excessive hunger, excessive thirst, and excessive urination. Heme:  Denies abnormal bruising and bleeding. Allergy:  Denies hives or rash, itching eyes, persistent infections, and seasonal allergies.  Physical Exam  General:  Well-developed,well-nourished,in no acute distress; alert,appropriate and cooperative throughout examination HEENT: No facial asymmetry,  EOMI, No sinus tenderness, TM's Clear, oropharynx  pink and moist.   Chest: Clear to auscultation bilaterally.  CVS: S1, S2, No murmurs, No S3.   Abd: Soft, Nontender.  MS: Adequate ROM spine, hips, shoulders and knees.  Ext: No edema.   CNS: CN 2-12 intact, power tone and sensation normal throughout.   Skin:sebaceous cyst posterior right chest, no drainage, warmthor significant eryhema Psych: Good eye contact, normal affect.  Memory intact, not anxious or depressed appearing.    Impression & Recommendations:  Problem # 1:  POSTMENOPAUSAL BLEEDING (ICD-627.1) Assessment Comment Only  Orders: Gynecologic Referral (Gyn)  Problem # 2:  OBESITY, UNSPECIFIED (ICD-278.00) Assessment: Deteriorated  Ht: 61 (10/29/2010)   Wt: 195.75 (10/29/2010)   BMI: 37.12 (10/29/2010) therapeutic lifestyle change discussed and encouraged  Problem # 3:  ESSENTIAL HYPERTENSION, BENIGN (ICD-401.1) Assessment: Deteriorated  Her updated medication list for this problem includes:    Amlodipine Besylate 10 Mg Tabs (Amlodipine besylate) .Marland KitchenMarland KitchenMarland KitchenMarland Kitchen  One tab by mouth once daily    Benazepril Hcl 40 Mg Tabs (Benazepril hcl) ..... One tab by mouth once daily Patient advised to follow low sodium diet rich in fruit and vegetables, and to commit to at least 30 minutes 5 days per week of regular exercise , to improve blood presure control.   BP today: 130/90 Prior BP: 122/82 (07/29/2010)  Labs Reviewed: K+: 3.9  (07/29/2010) Creat: : 0.90 (07/29/2010)   Chol: 153 (01/12/2010)   HDL: 53 (01/12/2010)   LDL: 89 (01/12/2010)   TG: 56 (01/12/2010)  Problem # 4:  SEBACEOUS CYST, INFECTED (ICD-706.2) Assessment: Comment Only doxycycline prescribed  Complete Medication List: 1)  Aspirin 81 Mg Tbec (Aspirin) .... Take one tablet by mouth once a day 2)  Multivitamins Tabs (Multiple vitamin) .... One tab by mouth once daily 3)  Amlodipine Besylate 10 Mg Tabs (Amlodipine besylate) .... One tab by mouth once daily 4)  Benazepril Hcl 40 Mg Tabs (Benazepril hcl) .... One tab by mouth once daily 5)  Phentermine Hcl 37.5 Mg Tabs (Phentermine hcl) .... One tablet once every morning 6)  Doxycycline Hyclate 100 Mg Caps (Doxycycline hyclate) .... Take 1 capsule by mouth two times a day 7)  Fluconazole 150 Mg Tabs (Fluconazole) .... Take 1 tablet by mouth once a day as needed for vaginal itch  Patient Instructions: 1)  Please schedule a follow-up appointment in 3.5 months. 2)  It is important that you exercise regularly at least 40 minutes 5 times a week. If you develop chest pain, have severe difficulty breathing, or feel very tired , stop exercising immediately and seek medical attention. 3)  You need to lose weight. Consider a lower calorie diet and regular exercise. goal is 9 to 12 pounds 4)  you need to take hALF a phentermine every morning and not the whole 5)  med is sent in for cyst Prescriptions: BENAZEPRIL HCL 40 MG  TABS (BENAZEPRIL HCL) one tab by mouth once daily  #30 Tablet x 3   Entered by:   Everitt Amber LPN   Authorized by:   Syliva Overman MD   Signed by:   Everitt Amber LPN on 54/06/8118   Method used:   Electronically to        [facility] (retail)       [address]       [place], [place]  [id number]       Ph: [phone]       Fax: [phone]   RxID:   [id number] AMLODIPINE BESYLATE 10 MG  TABS (AMLODIPINE BESYLATE) one tab by mouth once daily  #30 Tablet x  3   Entered by:   Everitt Amber LPN   Authorized by:   Syliva Overman MD   Signed by:   Everitt Amber LPN on 34/74/2595   Method used:   Electronically to        [facility] (retail)       [address]       [place], [place]  [id number]       Ph: [phone]       Fax: [phone]   RxID:   [id number] FLUCONAZOLE 150 MG TABS (FLUCONAZOLE) Take 1 tablet by mouth once a day as needed for vaginal itch  #3 x 0   Entered and Authorized by:   Syliva Overman MD   Signed by:   Syliva Overman MD on 10/29/2010   Method used:   Electronically  to        [facility] (retail)       [address]       [place], [place]  [address]       Ph: [phone]       Fax: [phone]   RxID:   [id number] DOXYCYCLINE HYCLATE 100 MG CAPS (DOXYCYCLINE HYCLATE) Take 1 capsule by mouth two times a day  #14 x 0   Entered and Authorized by:   Syliva Overman MD   Signed by:   Syliva Overman MD on 10/29/2010   Method used:   Electronically to        [facility] (retail)       [address]       [place], [place]  [id number]       Ph: [phone]       Fax: [phone]   RxID:   [id number] PHENTERMINE HCL 37.5 MG TABS (PHENTERMINE HCL) one tablet once every morning  #30 x 1   Entered and Authorized by:   Syliva Overman MD   Signed by:   Syliva Overman MD on 10/29/2010   Method used:   Printed then faxed to ...       [facility] (retail)       [address]       [place], [place]  [id number]       Ph: [phone]       Fax: [phone]   RxID:   [id number]    Orders Added: 1)  Est. Patient Level IV [[address]] 2)  Gynecologic Referral [Gyn]

## 2010-12-27 LAB — CBC
MCHC: 32.6 g/dL (ref 30.0–36.0)
MCV: 87.6 fL (ref 78.0–100.0)
RDW: 15.5 % (ref 11.5–15.5)
WBC: 4.2 10*3/uL (ref 4.0–10.5)

## 2010-12-27 LAB — BASIC METABOLIC PANEL
CO2: 29 mEq/L (ref 19–32)
Creatinine, Ser: 0.75 mg/dL (ref 0.4–1.2)
Sodium: 140 mEq/L (ref 135–145)

## 2011-02-18 NOTE — H&P (Signed)
NAME:  Megan Morales, Megan Morales                        ACCOUNT NO.:  [id number]   MEDICAL RECORD NO.:  [id number]                   PATIENT TYPE:  AMB   LOCATION:  DAY                                  FACILITY:  [facility]   PHYSICIAN:  Dirk Dress. Katrinka Blazing, M.D.                DATE OF BIRTH:  January 19, 1956   DATE OF ADMISSION:  DATE OF DISCHARGE:                                HISTORY & PHYSICAL   A 55 year old female with a history of chronic anemia who was referred by  Dr. Lodema Hong for upper and lower endoscopy as part of a workup for her  anemia.  She has been on chronic iron therapy for four months and remains  anemic.  She has iron deficiency based on anemia panel.  No history of  abdominal pain, no black stools or diarrhea, no family history of colon  cancer.  The patient is scheduled for upper and lower endoscopy   PAST MEDICAL HISTORY:  1.  Hypertension.  2.  Chronic anemia.   MEDICATIONS:  1.  Lotrel 10/20 b.i.d.  2.  Lasix 5 mg every day.  3.  Multivitamin one every day.  4.  Ferrous sulfate b.i.d.   SURGERY:  Tubal ligation.   REVIEW OF SYSTEMS:  Unremarkable except for pedal edema.   FAMILY HISTORY:  Positive for hypertension, diabetes, stroke.   PHYSICAL EXAMINATION:  VITAL SIGNS:  Blood pressure 130/90, pulse 80,  respirations 18, weight 187 pounds.  HEENT:  Unremarkable.  NECK:  Supple.  No JVD or bruit.  CHEST:  Clear to auscultation.  HEART:  Regular rate and rhythm without murmur, gallop or rub.  ABDOMEN:  Soft, nontender.  No masses.  EXTREMITIES:  No cyanosis, clubbing, or edema.  RECTAL:  Normal.  No masses.  Stool guaiac negative.  NEUROLOGIC:  No focal motor, sensory, or  cerebellar deficit.   IMPRESSION:  1.  Chronic iron deficiency anemia.  2.  Hypertension.   PLAN:  Upper and lower endoscopy.     ___________________________________________                                         Dirk Dress. Katrinka Blazing, M.D.   LCS/MEDQ  D:  06/02/2004  T:  06/03/2004  Job:  [id number]

## 2011-02-25 ENCOUNTER — Ambulatory Visit: Payer: Self-pay | Admitting: Family Medicine

## 2011-03-15 ENCOUNTER — Other Ambulatory Visit: Payer: Self-pay | Admitting: Family Medicine

## 2011-03-22 ENCOUNTER — Encounter: Payer: Self-pay | Admitting: Family Medicine

## 2011-03-23 ENCOUNTER — Encounter: Payer: Self-pay | Admitting: Family Medicine

## 2011-03-23 ENCOUNTER — Ambulatory Visit (INDEPENDENT_AMBULATORY_CARE_PROVIDER_SITE_OTHER): Payer: BC Managed Care – PPO | Admitting: Family Medicine

## 2011-03-23 VITALS — BP 140/100 | HR 98 | Resp 16 | Ht 61.0 in | Wt 199.4 lb

## 2011-03-23 DIAGNOSIS — J329 Chronic sinusitis, unspecified: Secondary | ICD-10-CM

## 2011-03-23 DIAGNOSIS — R7303 Prediabetes: Secondary | ICD-10-CM

## 2011-03-23 DIAGNOSIS — E669 Obesity, unspecified: Secondary | ICD-10-CM

## 2011-03-23 DIAGNOSIS — R7309 Other abnormal glucose: Secondary | ICD-10-CM

## 2011-03-23 DIAGNOSIS — I10 Essential (primary) hypertension: Secondary | ICD-10-CM

## 2011-03-23 DIAGNOSIS — L723 Sebaceous cyst: Secondary | ICD-10-CM

## 2011-03-23 MED ORDER — AMLODIPINE BESYLATE 10 MG PO TABS: 10.0000 mg | ORAL_TABLET | Freq: Every day | ORAL | Status: DC

## 2011-03-23 MED ORDER — FLUCONAZOLE 100 MG PO TABS: 100.0000 mg | ORAL_TABLET | Freq: Every day | ORAL | Status: AC

## 2011-03-23 MED ORDER — BENAZEPRIL HCL 40 MG PO TABS: 40.0000 mg | ORAL_TABLET | Freq: Every day | ORAL | Status: DC

## 2011-03-23 MED ORDER — SULFAMETHOXAZOLE-TRIMETHOPRIM 800-160 MG PO TABS: 1.0000 | ORAL_TABLET | Freq: Two times a day (BID) | ORAL | Status: AC

## 2011-03-23 MED ORDER — TRIAMTERENE-HCTZ 37.5-25 MG PO TABS: 1.0000 | ORAL_TABLET | Freq: Every day | ORAL | Status: DC

## 2011-03-23 NOTE — Progress Notes (Signed)
  Subjective:    Patient ID: Megan Morales, female    DOB: Nov 05, 1955, 55 y.o.   MRN: [id number]  HPI 2 week h/ofrontal pressure with nasal drainage , and green drainage and chills,no ear pain or sore throat, but scratchy, cough productive of sputum which is green Otherwise she has been fair, her work hours are less, and so is her stress. She has not made the desired progress with weight loss, and plans to work on this more diligently.   Review of Systems paroxysmal no Denies chest pains, palpitations, orthopnea and leg swelling Denies abdominal pain, nausea, vomiting,diarrhea or constipation.  . Denies dysuria, frequency, hesitancy or incontinence. Denies joint pain, swelling and limitation in mobility. Denies headaches, seizure, numbness, or tingling. Denies depression, anxiety or insomnia. Denies skin break down or rash.        Objective:   Physical Exam Patient alert and oriented and in no Cardiopulmonary distress.  HEENT: No facial asymmetry, EOMI, no sinus tenderness, TM's clear, Oropharynx pink and moist.  Neck supple no adenopathy.Maxillary sinus tenderness  Chest: Clear to auscultation bilaterally.  CVS: S1, S2 no murmurs, no S3.  ABD: Soft non tender. Bowel sounds normal.  Ext: No edema  MS: Adequate ROM spine, shoulders, hips and knees.  Skin: Intact, no ulcerations or rash noted.  Psych: Good eye contact, normal affect. Memory intact not anxious or depressed appearing.  CNS: CN 2-12 intact, power, tone and sensation normal throughout.        Assessment & Plan:

## 2011-03-23 NOTE — Patient Instructions (Addendum)
F/u in 2 months.  Your blood pressure is high, additional med added, take every day at the sAME TIME. MED IS SENT IN FOR SINUS INFECTIION  It is important that you exercise regularly at least 30 minutes 5 times a week. If you develop chest pain, have severe difficulty breathing, or feel very tired, stop exercising immediately and seek medical attention  A healthy diet is rich in fruit, vegetables and whole grains. Poultry fish, nuts and beans are a healthy choice for protein rather then red meat. A low sodium diet and drinking 64 ounces of water daily is generally recommended. Oils and sweet should be limited. Carbohydrates especially for those who are diabetic or overweight, should be limited to 30-45 gram per meal. It is important to eat on a regular schedule, at least 3 times daily. Snacks should be primarily fruits, vegetables or nuts.

## 2011-03-24 LAB — BASIC METABOLIC PANEL
BUN: 14 mg/dL (ref 6–23)
Chloride: 104 mEq/L (ref 96–112)
Potassium: 4.1 mEq/L (ref 3.5–5.3)

## 2011-03-24 LAB — HEMOGLOBIN A1C
Hgb A1c MFr Bld: 5.9 % — ABNORMAL HIGH (ref <5.7)
Mean Plasma Glucose: 123 mg/dL — ABNORMAL HIGH (ref <117)

## 2011-04-07 DIAGNOSIS — R7303 Prediabetes: Secondary | ICD-10-CM | POA: Insufficient documentation

## 2011-04-07 NOTE — Assessment & Plan Note (Signed)
Deteriorated. Patient re-educated about  the importance of commitment to a  minimum of 150 minutes of exercise per week. The importance of healthy food choices with portion control discussed. Encouraged to start a food diary, count calories and to consider  joining a support group. Sample diet sheets offered. Goals set by the patient for the next several months.    

## 2011-04-07 NOTE — Assessment & Plan Note (Signed)
Medication compliance addressed. Commitment to regular exercise and healthy  food choices, with portion control discussed. DASH diet and low fat diet discussed and literature offered. Changes in medication made at this visit.  

## 2011-04-07 NOTE — Assessment & Plan Note (Signed)
The importance of low carbohydrate diet and reducing sweets with weight loss was stressed to reduce development of diabetes

## 2011-04-07 NOTE — Assessment & Plan Note (Signed)
Acute infection antibiotics prescribed, and n/s washes recommended

## 2011-05-25 ENCOUNTER — Encounter: Payer: Self-pay | Admitting: Family Medicine

## 2011-05-26 ENCOUNTER — Encounter: Payer: Self-pay | Admitting: Family Medicine

## 2011-05-26 ENCOUNTER — Ambulatory Visit (INDEPENDENT_AMBULATORY_CARE_PROVIDER_SITE_OTHER): Payer: BC Managed Care – PPO | Admitting: Family Medicine

## 2011-05-26 VITALS — BP 118/80 | HR 84 | Resp 16 | Ht 61.0 in | Wt 197.8 lb

## 2011-05-26 DIAGNOSIS — R7303 Prediabetes: Secondary | ICD-10-CM

## 2011-05-26 DIAGNOSIS — Z13220 Encounter for screening for lipoid disorders: Secondary | ICD-10-CM

## 2011-05-26 DIAGNOSIS — E669 Obesity, unspecified: Secondary | ICD-10-CM

## 2011-05-26 DIAGNOSIS — Z139 Encounter for screening, unspecified: Secondary | ICD-10-CM

## 2011-05-26 DIAGNOSIS — R7309 Other abnormal glucose: Secondary | ICD-10-CM

## 2011-05-26 DIAGNOSIS — I10 Essential (primary) hypertension: Secondary | ICD-10-CM

## 2011-05-26 DIAGNOSIS — R5383 Other fatigue: Secondary | ICD-10-CM

## 2011-05-26 DIAGNOSIS — R5381 Other malaise: Secondary | ICD-10-CM

## 2011-05-26 MED ORDER — BENAZEPRIL HCL 40 MG PO TABS: 40.0000 mg | ORAL_TABLET | Freq: Every day | ORAL | Status: DC

## 2011-05-26 MED ORDER — AMLODIPINE BESYLATE 10 MG PO TABS: 10.0000 mg | ORAL_TABLET | Freq: Every day | ORAL | Status: DC

## 2011-05-26 NOTE — Patient Instructions (Signed)
CPE end November.  Blood pressure is great, no med change  It is important that you exercise regularly at least 30 minutes 5 times a week. If you develop chest pain, have severe difficulty breathing, or feel very tired, stop exercising immediately and seek medical attention    A healthy diet is rich in fruit, vegetables and whole grains. Poultry fish, nuts and beans are a healthy choice for protein rather then red meat. A low sodium diet and drinking 64 ounces of water daily is generally recommended. Oils and sweet should be limited. Carbohydrates especially for those who are diabetic or overweight, should be limited to 34-45 gram per meal. It is important to eat on a regular schedule, at least 3 times daily. Snacks should be primarily fruits, vegetables or nuts.   LABWORK  NEEDS TO BE DONE BETWEEN 3 TO 7 DAYS BEFORE YOUR NEXT SCEDULED  VISIT.  THIS WILL IMPROVE THE QUALITY OF YOUR CARE.   CBC, chem 7, lipid,, tsh and HBA1C

## 2011-05-29 NOTE — Assessment & Plan Note (Signed)
HBA1C unchanged at 5.9, educated pt re the need to modify lifestyle to reduce the risk of becoming diabetic

## 2011-05-29 NOTE — Assessment & Plan Note (Signed)
Controlled, no change in medication  

## 2011-05-29 NOTE — Assessment & Plan Note (Signed)
Unchanged, has recently had a lot of personal stress, involving unexpected death of her sibling Patient re-educated about  the importance of commitment to a  minimum of 150 minutes of exercise per week. The importance of healthy food choices with portion control discussed. Encouraged to start a food diary, count calories and to consider  joining a support group. Sample diet sheets offered. Goals set by the patient for the next several months.

## 2011-05-29 NOTE — Progress Notes (Signed)
  Subjective:    Patient ID: Megan Morales, female    DOB: [birth year], 55 y.o.   MRN: [id number]  HPI The PT is here for follow up and re-evaluation of chronic medical conditions, medication management and review of any available recent lab and radiology data.  Preventive health is updated, specifically  Cancer screening and Immunization.   Questions or concerns regarding consultations or procedures which the PT has had in the interim are  addressed. The PT denies any adverse reactions to current medications since the last visit.  Reports increased stress due to recent unexpected loss of her brother, has been unable to focus on lifestyle change for weight loss        Review of Systems See HPI Denies recent fever or chills. Denies sinus pressure, nasal congestion, ear pain or sore throat. Denies chest congestion, productive cough or wheezing. Denies chest pains, palpitations and leg swelling Denies abdominal pain, nausea, vomiting,diarrhea or constipation.   Denies dysuria, frequency, hesitancy or incontinence. Denies joint pain, swelling and limitation in mobility. Denies headaches, seizures, numbness, or tingling. Denies skin break down or rash.        Objective:   Physical Exam Patient alert and oriented and in no cardiopulmonary distress.  HEENT: No facial asymmetry, EOMI, no sinus tenderness,  oropharynx pink and moist.  Neck supple no adenopathy.  Chest: Clear to auscultation bilaterally.  CVS: S1, S2 no murmurs, no S3.  ABD: Soft non tender. Bowel sounds normal.  Ext: No edema  MS: Adequate ROM spine, shoulders, hips and knees.  Skin: Intact, no ulcerations or rash noted.  Psych: Good eye contact, normal affect. Memory intact not anxious or depressed appearing.  CNS: CN 2-12 intact, power, tone and sensation normal throughout.        Assessment & Plan:

## 2011-07-20 LAB — CBC
Hemoglobin: 12.3
MCHC: 33.7
MCV: 91.2
RBC: 4.01
RDW: 14.6 — ABNORMAL HIGH

## 2011-07-20 LAB — DIFFERENTIAL
Basophils Absolute: 0
Basophils Relative: 1
Eosinophils Absolute: 0.2
Eosinophils Relative: 5
Monocytes Absolute: 0.3
Monocytes Relative: 7
Neutro Abs: 2.1

## 2011-08-22 ENCOUNTER — Encounter: Payer: Self-pay | Admitting: Family Medicine

## 2011-08-29 ENCOUNTER — Encounter: Payer: BC Managed Care – PPO | Admitting: Family Medicine

## 2011-09-14 ENCOUNTER — Encounter: Payer: Self-pay | Admitting: Family Medicine

## 2011-09-15 ENCOUNTER — Ambulatory Visit (INDEPENDENT_AMBULATORY_CARE_PROVIDER_SITE_OTHER): Payer: BC Managed Care – PPO | Admitting: Family Medicine

## 2011-09-15 ENCOUNTER — Encounter: Payer: Self-pay | Admitting: Family Medicine

## 2011-09-15 VITALS — BP 120/84 | HR 88 | Resp 16 | Ht 61.0 in | Wt 201.0 lb

## 2011-09-15 DIAGNOSIS — E669 Obesity, unspecified: Secondary | ICD-10-CM

## 2011-09-15 DIAGNOSIS — M549 Dorsalgia, unspecified: Secondary | ICD-10-CM

## 2011-09-15 DIAGNOSIS — I10 Essential (primary) hypertension: Secondary | ICD-10-CM

## 2011-09-15 DIAGNOSIS — R7309 Other abnormal glucose: Secondary | ICD-10-CM

## 2011-09-15 DIAGNOSIS — R7303 Prediabetes: Secondary | ICD-10-CM

## 2011-09-15 MED ORDER — IBUPROFEN 800 MG PO TABS: 800.0000 mg | ORAL_TABLET | Freq: Three times a day (TID) | ORAL | Status: AC | PRN

## 2011-09-15 MED ORDER — KETOROLAC TROMETHAMINE 60 MG/2ML IM SOLN
60.0000 mg | Freq: Once | INTRAMUSCULAR | Status: AC
  Administered 2011-09-15: 60 mg via INTRAMUSCULAR

## 2011-09-15 MED ORDER — METHYLPREDNISOLONE ACETATE PF 80 MG/ML IJ SUSP
120.0000 mg | Freq: Once | INTRAMUSCULAR | Status: AC
  Administered 2011-09-15: 120 mg via INTRAMUSCULAR

## 2011-09-15 MED ORDER — PREDNISONE (PAK) 5 MG PO TABS: 5.0000 mg | ORAL_TABLET | ORAL | Status: DC

## 2011-09-15 MED ORDER — TRIAMTERENE-HCTZ 37.5-25 MG PO TABS: 1.0000 | ORAL_TABLET | Freq: Every day | ORAL | Status: DC
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
## 2011-09-15 NOTE — Patient Instructions (Addendum)
cPE as before.  Injections today in the buttocks for back pain and med is sent to your pharmacy.  Your symptoms are from nerve irritation in your spine, I believe, and you are referred for mRI of the spine asap

## 2011-09-16 ENCOUNTER — Ambulatory Visit (HOSPITAL_COMMUNITY)
Admission: RE | Admit: 2011-09-16 | Discharge: 2011-09-16 | Disposition: A | Discharge status: Home / Self Care | Payer: BC Managed Care – PPO | Source: Ambulatory Visit | Attending: Family Medicine | Admitting: Family Medicine

## 2011-09-16 DIAGNOSIS — M51379 Other intervertebral disc degeneration, lumbosacral region without mention of lumbar back pain or lower extremity pain: Secondary | ICD-10-CM | POA: Insufficient documentation

## 2011-09-16 DIAGNOSIS — M79609 Pain in unspecified limb: Secondary | ICD-10-CM | POA: Insufficient documentation

## 2011-09-16 DIAGNOSIS — M549 Dorsalgia, unspecified: Secondary | ICD-10-CM

## 2011-09-16 DIAGNOSIS — M5126 Other intervertebral disc displacement, lumbar region: Secondary | ICD-10-CM | POA: Insufficient documentation

## 2011-09-16 DIAGNOSIS — M545 Low back pain, unspecified: Secondary | ICD-10-CM | POA: Insufficient documentation

## 2011-09-16 DIAGNOSIS — M5137 Other intervertebral disc degeneration, lumbosacral region: Secondary | ICD-10-CM | POA: Insufficient documentation

## 2011-09-16 NOTE — Assessment & Plan Note (Signed)
Low carb diet discussed and info provided

## 2011-09-16 NOTE — Assessment & Plan Note (Signed)
Deteriorated. Patient re-educated about  the importance of commitment to a  minimum of 150 minutes of exercise per week. The importance of healthy food choices with portion control discussed. Encouraged to start a food diary, count calories and to consider  joining a support group. Sample diet sheets offered. Goals set by the patient for the next several months.    

## 2011-09-16 NOTE — Assessment & Plan Note (Signed)
Controlled, no change in medication  

## 2011-10-03 ENCOUNTER — Encounter: Payer: Self-pay | Admitting: Family Medicine

## 2011-10-06 ENCOUNTER — Other Ambulatory Visit: Payer: Self-pay | Admitting: Family Medicine

## 2011-10-06 DIAGNOSIS — Z139 Encounter for screening, unspecified: Secondary | ICD-10-CM

## 2011-10-10 ENCOUNTER — Ambulatory Visit (HOSPITAL_COMMUNITY)
Admission: RE | Admit: 2011-10-10 | Discharge: 2011-10-10 | Disposition: A | Discharge status: Home / Self Care | Payer: BC Managed Care – PPO | Source: Ambulatory Visit | Attending: Family Medicine | Admitting: Family Medicine

## 2011-10-10 DIAGNOSIS — Z1231 Encounter for screening mammogram for malignant neoplasm of breast: Secondary | ICD-10-CM | POA: Insufficient documentation

## 2011-10-10 DIAGNOSIS — Z139 Encounter for screening, unspecified: Secondary | ICD-10-CM

## 2011-10-11 LAB — CBC WITH DIFFERENTIAL/PLATELET
Basophils Relative: 0 % (ref 0–1)
Lymphocytes Relative: 38 % (ref 12–46)
MCV: 93.4 fL (ref 78.0–100.0)
Monocytes Absolute: 0.3 10*3/uL (ref 0.1–1.0)
Monocytes Relative: 6 % (ref 3–12)
Neutro Abs: 2.6 10*3/uL (ref 1.7–7.7)
Neutrophils Relative %: 53 % (ref 43–77)
Platelets: 211 10*3/uL (ref 150–400)
RDW: 13.2 % (ref 11.5–15.5)

## 2011-10-11 LAB — BASIC METABOLIC PANEL
CO2: 25 mEq/L (ref 19–32)
Creat: 0.98 mg/dL (ref 0.50–1.10)
Potassium: 4.3 mEq/L (ref 3.5–5.3)
Sodium: 143 mEq/L (ref 135–145)

## 2011-10-11 LAB — VITAMIN D 25 HYDROXY (VIT D DEFICIENCY, FRACTURES): Vit D, 25-Hydroxy: 23 ng/mL — ABNORMAL LOW (ref 30–89)

## 2011-10-11 LAB — LIPID PANEL: HDL: 54 mg/dL (ref >39)

## 2011-10-11 LAB — TSH: TSH: 1.514 u[IU]/mL (ref 0.350–4.500)

## 2011-10-13 ENCOUNTER — Encounter: Payer: Self-pay | Admitting: Family Medicine

## 2011-10-17 ENCOUNTER — Other Ambulatory Visit (HOSPITAL_COMMUNITY)
Admission: RE | Admit: 2011-10-17 | Discharge: 2011-10-17 | Disposition: A | Discharge status: Home / Self Care | Payer: BC Managed Care – PPO | Source: Ambulatory Visit | Attending: Family Medicine | Admitting: Family Medicine

## 2011-10-17 ENCOUNTER — Encounter: Payer: Self-pay | Admitting: Family Medicine

## 2011-10-17 ENCOUNTER — Ambulatory Visit (INDEPENDENT_AMBULATORY_CARE_PROVIDER_SITE_OTHER): Payer: BC Managed Care – PPO | Admitting: Family Medicine

## 2011-10-17 VITALS — BP 134/88 | HR 86 | Resp 16 | Ht 61.0 in | Wt 203.0 lb

## 2011-10-17 DIAGNOSIS — E669 Obesity, unspecified: Secondary | ICD-10-CM

## 2011-10-17 DIAGNOSIS — Z1211 Encounter for screening for malignant neoplasm of colon: Secondary | ICD-10-CM

## 2011-10-17 DIAGNOSIS — Z01419 Encounter for gynecological examination (general) (routine) without abnormal findings: Secondary | ICD-10-CM | POA: Insufficient documentation

## 2011-10-17 DIAGNOSIS — R7303 Prediabetes: Secondary | ICD-10-CM

## 2011-10-17 DIAGNOSIS — R5383 Other fatigue: Secondary | ICD-10-CM

## 2011-10-17 DIAGNOSIS — I10 Essential (primary) hypertension: Secondary | ICD-10-CM

## 2011-10-17 DIAGNOSIS — R7309 Other abnormal glucose: Secondary | ICD-10-CM

## 2011-10-17 DIAGNOSIS — Z124 Encounter for screening for malignant neoplasm of cervix: Secondary | ICD-10-CM

## 2011-10-17 DIAGNOSIS — M549 Dorsalgia, unspecified: Secondary | ICD-10-CM

## 2011-10-17 DIAGNOSIS — Z0000 Encounter for general adult medical examination without abnormal findings: Secondary | ICD-10-CM

## 2011-10-17 DIAGNOSIS — R5381 Other malaise: Secondary | ICD-10-CM

## 2011-10-17 MED ORDER — IBUPROFEN 800 MG PO TABS: 800.0000 mg | ORAL_TABLET | Freq: Three times a day (TID) | ORAL | Status: AC | PRN

## 2011-10-17 NOTE — Assessment & Plan Note (Signed)
Deteriorated. Patient re-educated about  the importance of commitment to a  minimum of 150 minutes of exercise per week. The importance of healthy food choices with portion control discussed. Encouraged to start a food diary, count calories and to consider  joining a support group. Sample diet sheets offered. Goals set by the patient for the next several months.    

## 2011-10-17 NOTE — Patient Instructions (Addendum)
F/u in 4 months.  We will provide a 1500 calorie diet sheet which I encourage you to follow.  Pls try and use "my fitness pal" to help with control of your eating. Weight loss goal orf 2 to 3 pounds per month.  You are referred to a pain specialist for further management of back pain. I recommend a complete eye exam every 2 to 3 years by a specialist, pls schedule if due  Pls follow a ,low fat diet, your bad cholesterol is high, we will provide information on this also.  HBA1C and fasting chem 7 in 4 months , before visit

## 2011-10-17 NOTE — Progress Notes (Signed)
Addended by: Abner Greenspan on: 10/17/2011 04:47 PM   Modules accepted: Orders

## 2011-10-17 NOTE — Progress Notes (Signed)
  Subjective:    Patient ID: Megan Morales, female    DOB: 01-23-1956, 56 y.o.   MRN: [id number]  HPI The PT is here for annual exam and re-evaluation of chronic medical conditions, medication management and review of any available recent lab and radiology data.  Preventive health is updated, specifically  Cancer screening and Immunization.   Questions or concerns regarding consultations or procedures which the PT has had in the interim are  addressed. The PT denies any adverse reactions to current medications since the last visit.  There are no new concerns. Wants referral for management of back pain There are no specific complaints      Review of Systems See HPI Denies recent fever or chills. Denies sinus pressure, nasal congestion, ear pain or sore throat. Denies chest congestion, productive cough or wheezing. Denies chest pains, palpitations and leg swelling Denies abdominal pain, nausea, vomiting,diarrhea or constipation.   Denies dysuria, frequency, hesitancy or incontinence.  Denies headaches, seizures, numbness, or tingling. Denies depression, anxiety or insomnia. Denies skin break down or rash.     Objective:   Physical Exam Pleasant well nourished female, alert and oriented x 3, in no cardio-pulmonary distress. Afebrile. HEENT No facial trauma or asymetry. Sinuses non tender.  EOMI, PERTL, fundoscopic exam is normal, no hemorhage or exudate.  External ears normal, tympanic membranes clear. Oropharynx moist, no exudate, good dentition. Neck: supple, no adenopathy,JVD or thyromegaly.No bruits.  Chest: Clear to ascultation bilaterally.No crackles or wheezes. Non tender to palpation  Breast: No asymetry,no masses. No nipple discharge or inversion. No axillary or supraclavicular adenopathy  Cardiovascular system; Heart sounds normal,  S1 and  S2 ,no S3.  No murmur, or thrill. Apical beat not displaced Peripheral pulses normal.  Abdomen: Soft, non tender,  no organomegaly or masses. No bruits. Bowel sounds normal. No guarding, tenderness or rebound.  Rectal:  No mass. Guaiac negative stool.  GU: External genitalia normal. No lesions. Vaginal canal normal.No discharge. Uterus normal size, no adnexal masses, no cervical motion or adnexal tenderness.  Musculoskeletal exam: Decreased ROM of spine,adequate in  hips , shoulders and knees. No deformity ,swelling or crepitus noted. No muscle wasting or atrophy.   Neurologic: Cranial nerves 2 to 12 intact. Power, tone ,sensation and reflexes normal throughout. No disturbance in gait. No tremor.  Skin: Intact, no ulceration, erythema , scaling or rash noted. Pigmentation normal throughout  Psych; Normal mood and affect. Judgement and concentration normal        Assessment & Plan:

## 2011-10-17 NOTE — Assessment & Plan Note (Signed)
Deteriorated the importance of weight loss stressed

## 2011-10-17 NOTE — Assessment & Plan Note (Signed)
Controlled, no change in medication  

## 2011-10-17 NOTE — Assessment & Plan Note (Signed)
Persistent and uncontrolled, refer for epidural and refill ibuprofen

## 2012-02-14 ENCOUNTER — Encounter: Payer: Self-pay | Admitting: Family Medicine

## 2012-02-14 ENCOUNTER — Ambulatory Visit: Payer: BC Managed Care – PPO | Admitting: Family Medicine

## 2012-04-13 ENCOUNTER — Other Ambulatory Visit: Payer: Self-pay | Admitting: Family Medicine

## 2012-11-07 ENCOUNTER — Other Ambulatory Visit: Payer: Self-pay | Admitting: Family Medicine

## 2012-12-31 ENCOUNTER — Other Ambulatory Visit: Payer: Self-pay | Admitting: Family Medicine

## 2012-12-31 ENCOUNTER — Ambulatory Visit (INDEPENDENT_AMBULATORY_CARE_PROVIDER_SITE_OTHER): Payer: BC Managed Care – PPO | Admitting: Family Medicine

## 2012-12-31 ENCOUNTER — Ambulatory Visit (HOSPITAL_COMMUNITY)
Admission: RE | Admit: 2012-12-31 | Discharge: 2012-12-31 | Disposition: A | Discharge status: Home / Self Care | Payer: BC Managed Care – PPO | Source: Ambulatory Visit | Attending: Family Medicine | Admitting: Family Medicine

## 2012-12-31 VITALS — BP 138/82 | HR 96 | Resp 18 | Ht 61.0 in | Wt 208.1 lb

## 2012-12-31 DIAGNOSIS — M25512 Pain in left shoulder: Secondary | ICD-10-CM | POA: Insufficient documentation

## 2012-12-31 DIAGNOSIS — E669 Obesity, unspecified: Secondary | ICD-10-CM

## 2012-12-31 DIAGNOSIS — M25519 Pain in unspecified shoulder: Secondary | ICD-10-CM

## 2012-12-31 DIAGNOSIS — R7303 Prediabetes: Secondary | ICD-10-CM

## 2012-12-31 DIAGNOSIS — R7309 Other abnormal glucose: Secondary | ICD-10-CM

## 2012-12-31 DIAGNOSIS — R5381 Other malaise: Secondary | ICD-10-CM

## 2012-12-31 DIAGNOSIS — L989 Disorder of the skin and subcutaneous tissue, unspecified: Secondary | ICD-10-CM | POA: Insufficient documentation

## 2012-12-31 DIAGNOSIS — I10 Essential (primary) hypertension: Secondary | ICD-10-CM

## 2012-12-31 DIAGNOSIS — E559 Vitamin D deficiency, unspecified: Secondary | ICD-10-CM

## 2012-12-31 DIAGNOSIS — R5383 Other fatigue: Secondary | ICD-10-CM

## 2012-12-31 DIAGNOSIS — Z13220 Encounter for screening for lipoid disorders: Secondary | ICD-10-CM

## 2012-12-31 MED ORDER — PREDNISONE 5 MG PO TABS: 5.0000 mg | ORAL_TABLET | Freq: Every day | ORAL | Status: AC

## 2012-12-31 MED ORDER — NAPROXEN 375 MG PO TABS: 375.0000 mg | ORAL_TABLET | Freq: Two times a day (BID) | ORAL | Status: DC

## 2012-12-31 NOTE — Patient Instructions (Addendum)
CPE in 4 month  Fasting lipid, chem 7, HBA1C, TSH, Vit D this week please, any [facility] lab, Ginette Otto is OK  Please sched your mammogram past due, OK to call [facility] for the appt  It is important that you exercise regularly at least 30 minutes 5 times a week. If you develop chest pain, have severe difficulty breathing, or feel very tired, stop exercising immediately and seek medical attention   A healthy diet is rich in fruit, vegetables and whole grains. Poultry fish, nuts and beans are a healthy choice for protein rather then red meat. A low sodium diet and drinking 64 ounces of water daily is generally recommended. Oils and sweet should be limited. Carbohydrates especially for those who are diabetic or overweight, should be limited to 30-45 gram per meal. It is important to eat on a regular schedule, at least 3 times daily. Snacks should be primarily fruits, vegetables or nuts.  Weight loss goal of 3 to 4 pounds per month   Please get xray of the left shoulder, naproxen and prednisone prescribed short term and you are referred to PT  You are referred to dermatology

## 2013-01-01 ENCOUNTER — Other Ambulatory Visit: Payer: Self-pay

## 2013-01-01 ENCOUNTER — Telehealth: Payer: Self-pay | Admitting: Family Medicine

## 2013-01-01 LAB — BASIC METABOLIC PANEL
BUN: 19 mg/dL (ref 6–23)
Calcium: 9.9 mg/dL (ref 8.4–10.5)
Creat: 0.85 mg/dL (ref 0.50–1.10)
Glucose, Bld: 82 mg/dL (ref 70–99)
Potassium: 3.8 mEq/L (ref 3.5–5.3)

## 2013-01-01 LAB — LIPID PANEL
LDL Cholesterol: 101 mg/dL — ABNORMAL HIGH (ref 0–99)
Total CHOL/HDL Ratio: 2.9 Ratio
VLDL: 10 mg/dL (ref 0–40)

## 2013-01-01 LAB — HEMOGLOBIN A1C
Hgb A1c MFr Bld: 5.7 % — ABNORMAL HIGH (ref <5.7)
Mean Plasma Glucose: 117 mg/dL — ABNORMAL HIGH (ref <117)

## 2013-01-01 LAB — TSH: TSH: 1.501 u[IU]/mL (ref 0.350–4.500)

## 2013-01-01 MED ORDER — AMLODIPINE BESYLATE 10 MG PO TABS: ORAL_TABLET | ORAL | Status: DC

## 2013-01-01 MED ORDER — BENAZEPRIL HCL 40 MG PO TABS: 40.0000 mg | ORAL_TABLET | Freq: Every day | ORAL | Status: DC

## 2013-01-01 NOTE — Telephone Encounter (Signed)
meds refilled 

## 2013-01-02 DIAGNOSIS — E559 Vitamin D deficiency, unspecified: Secondary | ICD-10-CM | POA: Insufficient documentation

## 2013-01-06 ENCOUNTER — Encounter: Payer: Self-pay | Admitting: Family Medicine

## 2013-01-06 NOTE — Progress Notes (Signed)
  Subjective:    Patient ID: Megan Morales, female    DOB: 11-18-1955, 57 y.o.   MRN: [id number]  HPI The PT is here for follow up and re-evaluation of chronic medical conditions, medication management and review of any available recent lab and radiology data.  Preventive health is updated, specifically  Cancer screening and Immunization.   Questions or concerns regarding consultations or procedures which the PT has had in the interim are  addressed. The PT denies any adverse reactions to current medications since the last visit.  C/o increasd and uncontrolled left shoulder pain with reduced mobility, no specific inciting trauma   Review of Systems See HPI Denies recent fever or chills. Denies sinus pressure, nasal congestion, ear pain or sore throat. Denies chest congestion, productive cough or wheezing. Denies chest pains, palpitations and leg swelling Denies abdominal pain, nausea, vomiting,diarrhea or constipation.   Denies dysuria, frequency, hesitancy or incontinence. Denies headaches, seizures, numbness, or tingling. Denies depression, anxiety or insomnia. Denies skin break down or rash.        Objective:   Physical Exam  Patient alert and oriented and in no cardiopulmonary distress.  HEENT: No facial asymmetry, EOMI, no sinus tenderness,  oropharynx pink and moist.  Neck supple no adenopathy.  Chest: Clear to auscultation bilaterally.  CVS: S1, S2 no murmurs, no S3.  ABD: Soft non tender. Bowel sounds normal.  Ext: No edema  MS: Adequate ROM spine, , hips and knees.decreased ROM left shoulder  Skin: Intact, no ulcerations or rash noted.  Psych: Good eye contact, normal affect. Memory intact not anxious or depressed appearing.  CNS: CN 2-12 intact, power, tone and sensation normal throughout.       Assessment & Plan:

## 2013-01-06 NOTE — Assessment & Plan Note (Signed)
Pt to commit to 6 months of treatment

## 2013-01-06 NOTE — Assessment & Plan Note (Signed)
Skin tags and moles present which are irritating pt, referred to dermatology for removal

## 2013-01-06 NOTE — Assessment & Plan Note (Signed)
Updated lab needed Patient educated about the importance of limiting  Carbohydrate intake , the need to commit to daily physical activity for a minimum of 30 minutes , and to commit weight loss. The fact that changes in all these areas will reduce or eliminate all together the development of diabetes is stressed.    

## 2013-01-06 NOTE — Assessment & Plan Note (Signed)
Deteriorated. Patient re-educated about  the importance of commitment to a  minimum of 150 minutes of exercise per week. The importance of healthy food choices with portion control discussed. Encouraged to start a food diary, count calories and to consider  joining a support group. Sample diet sheets offered. Goals set by the patient for the next several months.    

## 2013-01-06 NOTE — Assessment & Plan Note (Signed)
Xray and PT and oral anti inflammatories

## 2013-01-06 NOTE — Assessment & Plan Note (Signed)
Controlled, no change in medication DASH diet and commitment to daily physical activity for a minimum of 30 minutes discussed and encouraged, as a part of hypertension management. The importance of attaining a healthy weight is also discussed.  

## 2013-01-08 ENCOUNTER — Ambulatory Visit: Payer: BC Managed Care – PPO | Attending: Family Medicine | Admitting: Physical Therapy

## 2013-01-08 DIAGNOSIS — M2560 Stiffness of unspecified joint, not elsewhere classified: Secondary | ICD-10-CM | POA: Insufficient documentation

## 2013-01-08 DIAGNOSIS — Z9181 History of falling: Secondary | ICD-10-CM | POA: Insufficient documentation

## 2013-01-08 DIAGNOSIS — M545 Low back pain, unspecified: Secondary | ICD-10-CM | POA: Insufficient documentation

## 2013-01-08 DIAGNOSIS — IMO0001 Reserved for inherently not codable concepts without codable children: Secondary | ICD-10-CM | POA: Insufficient documentation

## 2013-01-08 DIAGNOSIS — M25519 Pain in unspecified shoulder: Secondary | ICD-10-CM | POA: Insufficient documentation

## 2013-01-08 DIAGNOSIS — R262 Difficulty in walking, not elsewhere classified: Secondary | ICD-10-CM | POA: Insufficient documentation

## 2013-01-14 ENCOUNTER — Other Ambulatory Visit: Payer: Self-pay

## 2013-01-14 DIAGNOSIS — E559 Vitamin D deficiency, unspecified: Secondary | ICD-10-CM

## 2013-01-14 MED ORDER — ERGOCALCIFEROL 1.25 MG (50000 UT) PO CAPS: 50000.0000 [IU] | ORAL_CAPSULE | ORAL | Status: DC

## 2013-01-15 ENCOUNTER — Ambulatory Visit: Payer: BC Managed Care – PPO | Admitting: Rehabilitation

## 2013-01-17 ENCOUNTER — Ambulatory Visit: Payer: BC Managed Care – PPO | Admitting: Rehabilitation

## 2013-01-21 ENCOUNTER — Other Ambulatory Visit: Payer: Self-pay

## 2013-01-21 DIAGNOSIS — Z1231 Encounter for screening mammogram for malignant neoplasm of breast: Secondary | ICD-10-CM

## 2013-01-22 ENCOUNTER — Ambulatory Visit: Payer: BC Managed Care – PPO | Admitting: Rehabilitation

## 2013-01-24 ENCOUNTER — Ambulatory Visit: Payer: BC Managed Care – PPO | Admitting: Rehabilitation

## 2013-01-29 ENCOUNTER — Other Ambulatory Visit: Payer: Self-pay | Admitting: Dermatology

## 2013-02-27 ENCOUNTER — Ambulatory Visit
Admission: RE | Admit: 2013-02-27 | Discharge: 2013-02-27 | Disposition: A | Discharge status: Home / Self Care | Payer: BC Managed Care – PPO | Source: Ambulatory Visit

## 2013-02-27 DIAGNOSIS — Z1231 Encounter for screening mammogram for malignant neoplasm of breast: Secondary | ICD-10-CM

## 2013-04-03 ENCOUNTER — Other Ambulatory Visit (HOSPITAL_COMMUNITY)
Admission: RE | Admit: 2013-04-03 | Discharge: 2013-04-03 | Disposition: A | Discharge status: Home / Self Care | Payer: BC Managed Care – PPO | Source: Ambulatory Visit | Attending: Family Medicine | Admitting: Family Medicine

## 2013-04-03 ENCOUNTER — Encounter: Payer: Self-pay | Admitting: Family Medicine

## 2013-04-03 ENCOUNTER — Ambulatory Visit (INDEPENDENT_AMBULATORY_CARE_PROVIDER_SITE_OTHER): Payer: BC Managed Care – PPO | Admitting: Family Medicine

## 2013-04-03 VITALS — BP 132/76 | HR 85 | Resp 18 | Ht 61.0 in | Wt 207.1 lb

## 2013-04-03 DIAGNOSIS — Z1151 Encounter for screening for human papillomavirus (HPV): Secondary | ICD-10-CM | POA: Insufficient documentation

## 2013-04-03 DIAGNOSIS — Z1211 Encounter for screening for malignant neoplasm of colon: Secondary | ICD-10-CM

## 2013-04-03 DIAGNOSIS — R7303 Prediabetes: Secondary | ICD-10-CM

## 2013-04-03 DIAGNOSIS — Z0000 Encounter for general adult medical examination without abnormal findings: Secondary | ICD-10-CM

## 2013-04-03 DIAGNOSIS — Z01419 Encounter for gynecological examination (general) (routine) without abnormal findings: Secondary | ICD-10-CM | POA: Insufficient documentation

## 2013-04-03 DIAGNOSIS — R0789 Other chest pain: Secondary | ICD-10-CM

## 2013-04-03 DIAGNOSIS — Z1212 Encounter for screening for malignant neoplasm of rectum: Secondary | ICD-10-CM

## 2013-04-03 DIAGNOSIS — E669 Obesity, unspecified: Secondary | ICD-10-CM

## 2013-04-03 DIAGNOSIS — R7309 Other abnormal glucose: Secondary | ICD-10-CM

## 2013-04-03 DIAGNOSIS — I10 Essential (primary) hypertension: Secondary | ICD-10-CM

## 2013-04-03 DIAGNOSIS — E559 Vitamin D deficiency, unspecified: Secondary | ICD-10-CM

## 2013-04-03 LAB — POC HEMOCCULT BLD/STL (OFFICE/1-CARD/DIAGNOSTIC): Fecal Occult Blood, POC: NEGATIVE

## 2013-04-03 MED ORDER — PHENTERMINE HCL 37.5 MG PO TABS: 37.5000 mg | ORAL_TABLET | Freq: Every day | ORAL | Status: DC

## 2013-04-03 NOTE — Patient Instructions (Addendum)
F/u in 4 month  Please commit to daily exercise for a minimum of 30 minutes each day   Start phentermine half tablet once daily around lunchtime and commit to reducing portion size, and eating more watery vegetables than starchy foods and meats  Weight loss goal of at least 10 pounds to continue phentermine as prescribed   Vit D, CBC, hBA1C and chem 7 in 4 month  EKG today

## 2013-04-03 NOTE — Assessment & Plan Note (Addendum)
Blood ptressure supine on 04/03/2013 is 140/90, inadequate control, no med change Baseline EKG normal

## 2013-04-14 DIAGNOSIS — R0789 Other chest pain: Secondary | ICD-10-CM | POA: Insufficient documentation

## 2013-04-14 DIAGNOSIS — Z0000 Encounter for general adult medical examination without abnormal findings: Secondary | ICD-10-CM | POA: Insufficient documentation

## 2013-04-14 NOTE — Assessment & Plan Note (Signed)
intermitttent left chest pain, not aggravated by physical activity, no associated nausea or diaphoresis Base;line EKG NSR, no ischemia

## 2013-04-14 NOTE — Progress Notes (Signed)
  Subjective:    Patient ID: Megan Morales, female    DOB: 1955-12-06, 57 y.o.   MRN: [id number]  HPI The PT is here for annual exam and re-evaluation of chronic medical conditions, medication management and review of any available recent lab and radiology data.  Preventive health is updated, specifically  Cancer screening and Immunization.   Concerned about weight gain wants to start medication to help with this C/o intermittent left chest pain, not associated with physical activity , no light headedness, nausea non radiaiting    Review of Systems See HPI Denies recent fever or chills. Denies sinus pressure, nasal congestion, ear pain or sore throat. Denies chest congestion, productive cough or wheezing. Denies PND , orhtopnea, palpitations and leg swelling Denies abdominal pain, nausea, vomiting,diarrhea or constipation.   Denies dysuria, frequency, hesitancy or incontinence. Denies joint pain, swelling and limitation in mobility. Denies headaches, seizures, numbness, or tingling. Denies depression, anxiety or insomnia. Denies skin break down or rash.         Objective:   Physical Exam  Pleasant well nourished female, alert and oriented x 3, in no cardio-pulmonary distress. Afebrile. HEENT No facial trauma or asymetry. Sinuses non tender.  EOMI, PERTL, fundoscopic exam is normal, no hemorhage or exudate.  External ears normal, tympanic membranes clear. Oropharynx moist, no exudate, fair  dentition. Neck: supple, no adenopathy,JVD or thyromegaly.No bruits.  Chest: Clear to ascultation bilaterally.No crackles or wheezes. Non tender to palpation  Breast: No asymetry,no masses. No nipple discharge or inversion. No axillary or supraclavicular adenopathy  Cardiovascular system; Heart sounds normal,  S1 and  S2 ,no S3.  No murmur, or thrill. Apical beat not displaced Peripheral pulses normal.  Abdomen: Soft, non tender, no organomegaly or masses. No bruits. Bowel  sounds normal. No guarding, tenderness or rebound.  Rectal:  No mass. Guaiac negative stool.  GU: External genitalia normal. No lesions. Vaginal canal normal.No discharge. Uterus normal size, no adnexal masses, no cervical motion or adnexal tenderness.  Musculoskeletal exam: Full ROM of spine, hips , shoulders and knees. No deformity ,swelling or crepitus noted. No muscle wasting or atrophy.   Neurologic: Cranial nerves 2 to 12 intact. Power, tone ,sensation and reflexes normal throughout. No disturbance in gait. No tremor.  Skin: Intact, no ulceration, erythema , scaling or rash noted. Pigmentation normal throughout  Psych; Normal mood and affect. Judgement and concentration normal       Assessment & Plan:

## 2013-04-14 NOTE — Assessment & Plan Note (Signed)
Pelvic , rectal and breast as documented. Pt needs to modify lifestyle to improve health, she will start phentermine, risks were discussed, Baseline EKG obtained and is normal

## 2013-08-08 ENCOUNTER — Other Ambulatory Visit: Payer: Self-pay

## 2013-08-12 LAB — CBC
HCT: 38.3 % (ref 36.0–46.0)
MCH: 30.3 pg (ref 26.0–34.0)
MCHC: 34.2 g/dL (ref 30.0–36.0)
MCV: 88.5 fL (ref 78.0–100.0)
Platelets: 202 10*3/uL (ref 150–400)
RBC: 4.33 MIL/uL (ref 3.87–5.11)
RDW: 14.1 % (ref 11.5–15.5)

## 2013-08-12 LAB — HEMOGLOBIN A1C
Hgb A1c MFr Bld: 5.9 % — ABNORMAL HIGH (ref <5.7)
Mean Plasma Glucose: 123 mg/dL — ABNORMAL HIGH (ref <117)

## 2013-08-13 LAB — BASIC METABOLIC PANEL
BUN: 17 mg/dL (ref 6–23)
Calcium: 9.1 mg/dL (ref 8.4–10.5)
Chloride: 105 mEq/L (ref 96–112)
Creat: 0.9 mg/dL (ref 0.50–1.10)

## 2013-08-13 LAB — VITAMIN D 25 HYDROXY (VIT D DEFICIENCY, FRACTURES): Vit D, 25-Hydroxy: 32 ng/mL (ref 30–89)

## 2013-08-14 ENCOUNTER — Encounter: Payer: Self-pay | Admitting: Family Medicine

## 2013-08-14 ENCOUNTER — Ambulatory Visit (INDEPENDENT_AMBULATORY_CARE_PROVIDER_SITE_OTHER): Payer: BC Managed Care – PPO | Admitting: Family Medicine

## 2013-08-14 VITALS — BP 122/84 | HR 84 | Resp 16 | Ht 61.0 in | Wt 202.8 lb

## 2013-08-14 DIAGNOSIS — E669 Obesity, unspecified: Secondary | ICD-10-CM

## 2013-08-14 DIAGNOSIS — E8881 Metabolic syndrome: Secondary | ICD-10-CM

## 2013-08-14 DIAGNOSIS — R7309 Other abnormal glucose: Secondary | ICD-10-CM

## 2013-08-14 DIAGNOSIS — Z23 Encounter for immunization: Secondary | ICD-10-CM

## 2013-08-14 DIAGNOSIS — I10 Essential (primary) hypertension: Secondary | ICD-10-CM

## 2013-08-14 DIAGNOSIS — R7303 Prediabetes: Secondary | ICD-10-CM

## 2013-08-14 DIAGNOSIS — E785 Hyperlipidemia, unspecified: Secondary | ICD-10-CM

## 2013-08-14 MED ORDER — AMLODIPINE BESYLATE 10 MG PO TABS: ORAL_TABLET | ORAL | Status: DC

## 2013-08-14 MED ORDER — PHENTERMINE HCL 37.5 MG PO TABS: 37.5000 mg | ORAL_TABLET | Freq: Every day | ORAL | Status: DC

## 2013-08-14 MED ORDER — BENAZEPRIL HCL 40 MG PO TABS: 40.0000 mg | ORAL_TABLET | Freq: Every day | ORAL | Status: DC

## 2013-08-14 NOTE — Progress Notes (Signed)
  Subjective:    Patient ID: Megan Morales, female    DOB: [birth year], 57 y.o.   MRN: [id number]  HPI The PT is here for follow up and re-evaluation of chronic medical conditions, medication management and review of any available recent lab and radiology data.  Preventive health is updated, specifically  Cancer screening and Immunization.    The PT denies any adverse reactions to current medications since the last visit.  There are no new concerns. Plans to commit to regular exercise , recognizes the need, still reports long work hours and commitments to caring for her familyu as hte reason she has not done so yet There are no specific complaints       Review of Systems See HPI Denies recent fever or chills. Denies sinus pressure, nasal congestion, ear pain or sore throat. Denies chest congestion, productive cough or wheezing. Denies chest pains, palpitations and leg swelling Denies abdominal pain, nausea, vomiting,diarrhea or constipation.   Denies dysuria, frequency, hesitancy or incontinence. Denies joint pain, swelling and limitation in mobility. Denies headaches, seizures, numbness, or tingling. Denies depression, anxiety or insomnia. Denies skin break down or rash.        Objective:   Physical Exam Patient alert and oriented and in no cardiopulmonary distress.  HEENT: No facial asymmetry, EOMI, no sinus tenderness,  oropharynx pink and moist.  Neck supple no adenopathy.  Chest: Clear to auscultation bilaterally.  CVS: S1, S2 no murmurs, no S3.  ABD: Soft non tender. Bowel sounds normal.  Ext: No edema  MS: Adequate ROM spine, shoulders, hips and knees.  Skin: Intact, no ulcerations or rash noted.  Psych: Good eye contact, normal affect. Memory intact not anxious or depressed appearing.  CNS: CN 2-12 intact, power, tone and sensation normal throughout.        Assessment & Plan:

## 2013-08-14 NOTE — Patient Instructions (Addendum)
F/u in 4 month, call if you need me before  Flu vaccine today  HBA1C in 4 month, before visit   Change times you eat your heavy meal and start breakfast please.  CONGRATS on 5 pound weight loss.  Goal is 6 to 10 pounds in next 4 month  Continue half phentermine daily  It is important that you exercise regularly at least 30 minutes 5 times a week. If you develop chest pain, have severe difficulty breathing, or feel very tired, stop exercising immediately and seek medical attention

## 2013-08-18 DIAGNOSIS — E8881 Metabolic syndrome: Secondary | ICD-10-CM | POA: Insufficient documentation

## 2013-08-18 DIAGNOSIS — E785 Hyperlipidemia, unspecified: Secondary | ICD-10-CM | POA: Insufficient documentation

## 2013-08-18 NOTE — Assessment & Plan Note (Addendum)
Improved. Pt applauded on succesful weight loss through lifestyle change, and encouraged to continue same. Weight loss goal set for the next several months. Continue half  Phentermine daily and commit to regular exercise

## 2013-08-18 NOTE — Assessment & Plan Note (Signed)
Pt aware of increased CV risk and the need to lose weight and change her lifestyle

## 2013-08-18 NOTE — Assessment & Plan Note (Signed)
Updated lab needed Hyperlipidemia:Low fat diet discussed and encouraged.   

## 2013-08-18 NOTE — Assessment & Plan Note (Signed)
Deteriorated Patient educated about the importance of limiting  Carbohydrate intake , the need to commit to daily physical activity for a minimum of 30 minutes , and to commit weight loss. The fact that changes in all these areas will reduce or eliminate all together the development of diabetes is stressed.    

## 2013-08-18 NOTE — Assessment & Plan Note (Signed)
Controlled, no change in medication DASH diet and commitment to daily physical activity for a minimum of 30 minutes discussed and encouraged, as a part of hypertension management. The importance of attaining a healthy weight is also discussed.  

## 2013-12-12 ENCOUNTER — Ambulatory Visit: Payer: BC Managed Care – PPO | Admitting: Family Medicine

## 2014-01-20 LAB — HEMOGLOBIN A1C
Hgb A1c MFr Bld: 5.8 % — ABNORMAL HIGH (ref <5.7)
Mean Plasma Glucose: 120 mg/dL — ABNORMAL HIGH (ref <117)

## 2014-01-21 ENCOUNTER — Encounter: Payer: Self-pay | Admitting: Family Medicine

## 2014-01-21 ENCOUNTER — Ambulatory Visit (INDEPENDENT_AMBULATORY_CARE_PROVIDER_SITE_OTHER): Payer: BC Managed Care – PPO | Admitting: Family Medicine

## 2014-01-21 VITALS — BP 140/90 | HR 86 | Resp 18 | Ht 61.0 in | Wt 199.0 lb

## 2014-01-21 DIAGNOSIS — Z1211 Encounter for screening for malignant neoplasm of colon: Secondary | ICD-10-CM

## 2014-01-21 DIAGNOSIS — R7309 Other abnormal glucose: Secondary | ICD-10-CM

## 2014-01-21 DIAGNOSIS — R7303 Prediabetes: Secondary | ICD-10-CM

## 2014-01-21 DIAGNOSIS — E669 Obesity, unspecified: Secondary | ICD-10-CM

## 2014-01-21 DIAGNOSIS — E8881 Metabolic syndrome: Secondary | ICD-10-CM

## 2014-01-21 DIAGNOSIS — E785 Hyperlipidemia, unspecified: Secondary | ICD-10-CM

## 2014-01-21 DIAGNOSIS — I10 Essential (primary) hypertension: Secondary | ICD-10-CM

## 2014-01-21 MED ORDER — PHENTERMINE HCL 37.5 MG PO TABS: ORAL_TABLET | ORAL | Status: DC

## 2014-01-21 MED ORDER — AMPHETAMINE-DEXTROAMPHET ER 10 MG PO CP24: 10.0000 mg | ORAL_CAPSULE | Freq: Every day | ORAL | Status: DC

## 2014-01-21 NOTE — Patient Instructions (Addendum)
Annual exam in 4 month , call if you need me before  Blood pressure is elevated today you need more rest. Cut back on processed foods and salty foods  HBa1C, fasting lipid, chem 7, TSH  It is important that you exercise regularly at least 30 minutes 5 times a week. If you develop chest pain, have severe difficulty breathing, or feel very tired, stop exercising immediately and seek medical attention   A healthy diet is rich in fruit, vegetables and whole grains. Poultry fish, nuts and beans are a healthy choice for protein rather then red meat. A low sodium diet and drinking 64 ounces of water daily is generally recommended. Oils and sweet should be limited. Carbohydrates especially for those who are diabetic or overweight, should be limited to 60 -45 gram per meal. It is important to eat on a regular schedule, at least 3 times daily. Snacks should be primarily fruits, vegetables or nuts.   You are referred to Dr Fuller Plan for colonoscopy due in September

## 2014-01-21 NOTE — Progress Notes (Signed)
Subjective:    Patient ID: Megan Morales, female    DOB: Nov 07, 1955, 58 y.o.   MRN: [id number]  HPI The PT is here for follow up and re-evaluation of chronic medical conditions, medication management and review of any available recent lab and radiology data.  Preventive health is updated, specifically  Cancer screening and Immunization.   Questions or concerns regarding consultations or procedures which the PT has had in the interim are  addressed. The PT denies any adverse reactions to current medications since the last visit.  There are no new concerns.  There are no specific complaints       Review of Systems See HPI Denies recent fever or chills. Denies sinus pressure, nasal congestion, ear pain or sore throat. Denies chest congestion, productive cough or wheezing. Denies chest pains, palpitations and leg swelling Denies abdominal pain, nausea, vomiting,diarrhea or constipation.   Denies dysuria, frequency, hesitancy or incontinence. Denies joint pain, swelling and limitation in mobility. Denies headaches, seizures, numbness, or tingling. Denies depression or  anxiety has increased stress and insomnia due to upcoming wedding of her son this weekend as well as ill grand child, poor sleep, stressed Denies skin break down or rash.        Objective:   Physical Exam BP 140/90  Pulse 86  Resp 18  Ht 5\' 1"  (1.549 m)  Wt 199 lb (90.266 kg)  BMI 37.62 kg/m2  SpO2 99% Patient alert and oriented and in no cardiopulmonary distress.  HEENT: No facial asymmetry, EOMI, no sinus tenderness,  oropharynx pink and moist.  Neck supple no adenopathy.  Chest: Clear to auscultation bilaterally.  CVS: S1, S2 no murmurs, no S3.  ABD: Soft non tender. Bowel sounds normal.  Ext: No edema  MS: Adequate ROM spine, shoulders, hips and knees.  Skin: Intact, no ulcerations or rash noted.  Psych: Good eye contact, normal affect. Memory intact not anxious or depressed  appearing.  CNS: CN 2-12 intact, power, tone and sensation normal throughout.        Assessment & Plan:  ESSENTIAL HYPERTENSION, BENIGN Uncontrolled and elevated at this visit, which is unusual. Pt under increased stress due to ill health of her granbd daughter with pneumonia who kept her awake all last night DASH diet and commitment to daily physical activity for a minimum of 30 minutes discussed and encouraged, as a part of hypertension management. The importance of attaining a healthy weight is also discussed. No med change  Prediabetes Patient educated about the importance of limiting  Carbohydrate intake , the need to commit to daily physical activity for a minimum of 30 minutes , and to commit weight loss. The fact that changes in all these areas will reduce or eliminate all together the development of diabetes is stressed.   Updated lab needed at/ before next visit.   Dyslipidemia Hyperlipidemia:Low fat diet discussed and encouraged.  Updated lab needed at/ before next visit.   Metabolic syndrome X The increased risk of cardiovascular disease associated with this diagnosis, and the need to consistently work on lifestyle to change this is discussed. Following  a  heart healthy diet ,commitment to 30 minutes of exercise at least 5 days per week, as well as control of blood sugar and cholesterol , and achieving a healthy weight are all the areas to be addressed .   OBESITY, UNSPECIFIED Improved. Pt applauded on succesful weight loss through lifestyle change, and encouraged to continue same. Weight loss goal set for the next several  months. Needs to be more consistent with lifestyle change, weight loss goal of 2.5 pounds per month   \

## 2014-01-26 NOTE — Assessment & Plan Note (Signed)
Uncontrolled and elevated at this visit, which is unusual. Pt under increased stress due to ill health of her granbd daughter with pneumonia who kept her awake all last night DASH diet and commitment to daily physical activity for a minimum of 30 minutes discussed and encouraged, as a part of hypertension management. The importance of attaining a healthy weight is also discussed. No med change

## 2014-01-26 NOTE — Assessment & Plan Note (Signed)
The increased risk of cardiovascular disease associated with this diagnosis, and the need to consistently work on lifestyle to change this is discussed. Following  a  heart healthy diet ,commitment to 30 minutes of exercise at least 5 days per week, as well as control of blood sugar and cholesterol , and achieving a healthy weight are all the areas to be addressed .  

## 2014-01-26 NOTE — Assessment & Plan Note (Signed)
Hyperlipidemia:Low fat diet discussed and encouraged.  Updated lab needed at/ before next visit.  

## 2014-01-26 NOTE — Assessment & Plan Note (Signed)
Patient educated about the importance of limiting  Carbohydrate intake , the need to commit to daily physical activity for a minimum of 30 minutes , and to commit weight loss. The fact that changes in all these areas will reduce or eliminate all together the development of diabetes is stressed.   Updated lab needed at/ before next visit.  

## 2014-01-26 NOTE — Assessment & Plan Note (Signed)
Improved. Pt applauded on succesful weight loss through lifestyle change, and encouraged to continue same. Weight loss goal set for the next several months. Needs to be more consistent with lifestyle change, weight loss goal of 2.5 pounds per month

## 2014-03-17 ENCOUNTER — Other Ambulatory Visit: Payer: Self-pay

## 2014-03-17 DIAGNOSIS — Z1231 Encounter for screening mammogram for malignant neoplasm of breast: Secondary | ICD-10-CM

## 2014-03-21 ENCOUNTER — Ambulatory Visit
Admission: RE | Admit: 2014-03-21 | Discharge: 2014-03-21 | Disposition: A | Discharge status: Home / Self Care | Payer: BC Managed Care – PPO | Source: Ambulatory Visit

## 2014-03-21 DIAGNOSIS — Z1231 Encounter for screening mammogram for malignant neoplasm of breast: Secondary | ICD-10-CM

## 2014-05-23 LAB — LIPID PANEL
Cholesterol: 160 mg/dL (ref 0–200)
HDL: 54 mg/dL (ref >39)
LDL CALC: 97 mg/dL (ref 0–99)
Total CHOL/HDL Ratio: 3 Ratio
Triglycerides: 47 mg/dL (ref <150)
VLDL: 9 mg/dL (ref 0–40)

## 2014-05-23 LAB — BASIC METABOLIC PANEL
BUN: 13 mg/dL (ref 6–23)
CALCIUM: 8.9 mg/dL (ref 8.4–10.5)
CO2: 27 mEq/L (ref 19–32)
Chloride: 106 mEq/L (ref 96–112)
Creat: 0.86 mg/dL (ref 0.50–1.10)
GLUCOSE: 91 mg/dL (ref 70–99)
Potassium: 3.8 mEq/L (ref 3.5–5.3)
SODIUM: 141 meq/L (ref 135–145)

## 2014-05-23 LAB — TSH: TSH: 1.845 u[IU]/mL (ref 0.350–4.500)

## 2014-05-23 LAB — HEMOGLOBIN A1C
Hgb A1c MFr Bld: 5.8 % — ABNORMAL HIGH (ref <5.7)
Mean Plasma Glucose: 120 mg/dL — ABNORMAL HIGH (ref <117)

## 2014-05-26 ENCOUNTER — Other Ambulatory Visit (HOSPITAL_COMMUNITY)
Admission: RE | Admit: 2014-05-26 | Discharge: 2014-05-26 | Disposition: A | Discharge status: Home / Self Care | Payer: BC Managed Care – PPO | Source: Ambulatory Visit | Attending: Family Medicine | Admitting: Family Medicine

## 2014-05-26 ENCOUNTER — Ambulatory Visit (INDEPENDENT_AMBULATORY_CARE_PROVIDER_SITE_OTHER): Payer: BC Managed Care – PPO | Admitting: Family Medicine

## 2014-05-26 ENCOUNTER — Encounter: Payer: Self-pay | Admitting: Family Medicine

## 2014-05-26 VITALS — BP 132/84 | HR 77 | Resp 16 | Ht 61.0 in | Wt 197.0 lb

## 2014-05-26 DIAGNOSIS — Z01419 Encounter for gynecological examination (general) (routine) without abnormal findings: Secondary | ICD-10-CM | POA: Insufficient documentation

## 2014-05-26 DIAGNOSIS — Z23 Encounter for immunization: Secondary | ICD-10-CM

## 2014-05-26 DIAGNOSIS — R5383 Other fatigue: Secondary | ICD-10-CM

## 2014-05-26 DIAGNOSIS — Z124 Encounter for screening for malignant neoplasm of cervix: Secondary | ICD-10-CM

## 2014-05-26 DIAGNOSIS — R7309 Other abnormal glucose: Secondary | ICD-10-CM

## 2014-05-26 DIAGNOSIS — Z1211 Encounter for screening for malignant neoplasm of colon: Secondary | ICD-10-CM

## 2014-05-26 DIAGNOSIS — R5381 Other malaise: Secondary | ICD-10-CM

## 2014-05-26 DIAGNOSIS — Z0000 Encounter for general adult medical examination without abnormal findings: Secondary | ICD-10-CM

## 2014-05-26 DIAGNOSIS — I10 Essential (primary) hypertension: Secondary | ICD-10-CM

## 2014-05-26 DIAGNOSIS — R7303 Prediabetes: Secondary | ICD-10-CM

## 2014-05-26 DIAGNOSIS — E669 Obesity, unspecified: Secondary | ICD-10-CM

## 2014-05-26 LAB — POC HEMOCCULT BLD/STL (OFFICE/1-CARD/DIAGNOSTIC): Fecal Occult Blood, POC: NEGATIVE

## 2014-05-26 MED ORDER — PHENTERMINE HCL 37.5 MG PO TABS: ORAL_TABLET | ORAL | Status: DC

## 2014-05-26 NOTE — Patient Instructions (Addendum)
F/u in 4 month, call if you need me before  TdAP and flu vaccines today  We will send your colonoscopy report to Dr Lynne Leader attention  Increase phentermine to one daily  Weight loss goal of 2.5 to 3 pounds per month  HBA1C, CBC Chem 7 non  Fasting in 4 month, before visit  Excellent lipids , congrats  It is important that you exercise regularly at least 30 minutes 5 times a week. If you develop chest pain, have severe difficulty breathing, or feel very tired, stop exercising immediately and seek medical attention    A healthy diet is rich in fruit, vegetables and whole grains. Poultry fish, nuts and beans are a healthy choice for protein rather then red meat. A low sodium diet and drinking 64 ounces of water daily is generally recommended. Oils and sweet should be limited. Carbohydrates especially for those who are diabetic or overweight, should be limited to 60-45 gram per meal. It is important to eat on a regular schedule, at least 3 times daily. Snacks should be primarily fruits, vegetables or nuts.  ALL THE BEST FOR THE REST OF THE YEAR!

## 2014-05-27 LAB — CYTOLOGY - PAP

## 2014-06-02 ENCOUNTER — Encounter: Payer: Self-pay | Admitting: Family Medicine

## 2014-06-06 ENCOUNTER — Encounter: Payer: Self-pay | Admitting: Gastroenterology

## 2014-06-09 DIAGNOSIS — Z0000 Encounter for general adult medical examination without abnormal findings: Secondary | ICD-10-CM | POA: Insufficient documentation

## 2014-06-09 DIAGNOSIS — Z23 Encounter for immunization: Secondary | ICD-10-CM | POA: Insufficient documentation

## 2014-06-09 NOTE — Assessment & Plan Note (Signed)
Vaccine administered at visit.  

## 2014-06-09 NOTE — Assessment & Plan Note (Signed)
Annual exam as documented. Counseling done  re healthy lifestyle involving commitment to 150 minutes exercise per week, heart healthy diet, and attaining healthy weight.The importance of adequate sleep also discussed. Regular seat belt use and safe storage  of firearms if patient has them, is also discussed. Changes in health habits are decided on by the patient with goals and time frames  set for achieving them. Immunization and cancer screening needs are specifically addressed at this visit.  

## 2014-06-09 NOTE — Progress Notes (Signed)
   Subjective:    Patient ID: Megan Morales, female    DOB: 11/06/1955, 58 y.o.   MRN: [id number]  HPI Patient is in for annual physical exam. Recent labs are reviewed and are excellent Pt requests help with weight loss, more than she has been getting. Now starting with a personal trainer, and a dose increase in the phentermine may be benficial  Review of Systems See HPI     Objective:   Physical Exam BP 132/84  Pulse 77  Resp 16  Ht 5\' 1"  (1.549 m)  Wt 197 lb (89.359 kg)  BMI 37.24 kg/m2  SpO2 98% Pleasant well nourished female, alert and oriented x 3, in no cardio-pulmonary distress. Afebrile. HEENT No facial trauma or asymetry. Sinuses non tender.  EOMI, PERTL, fundoscopic exam  no hemorhage or exudate.  External ears normal, tympanic membranes clear. Oropharynx moist, no exudate, fairly  good dentition. Neck: supple, no adenopathy,JVD or thyromegaly.No bruits.  Chest: Clear to ascultation bilaterally.No crackles or wheezes. Non tender to palpation  Breast: No asymetry,no masses or lumps. No tenderness. No nipple discharge or inversion. No axillary or supraclavicular adenopathy  Cardiovascular system; Heart sounds normal,  S1 and  S2 ,no S3.  No murmur, or thrill. Apical beat not displaced Peripheral pulses normal.  Abdomen: Soft, non tender, no organomegaly or masses. No bruits. Bowel sounds normal. No guarding, tenderness or rebound.  Rectal:  Normal sphincter tone. No mass.No rectal masses.  Guaiac negative stool.  GU: External genitalia normal female genitalia , female distribution of hair. No lesions. Urethral meatus normal in size, no  Prolapse, no lesions visibly  Present. Bladder non tender. Vagina pink and moist , with no visible lesions , discharge present . Adequate pelvic support no  cystocele or rectocele noted Cervix pink and appears healthy, no lesions or ulcerations noted, no discharge noted from os Uterus enlartged, no adnexal  masses, no cervical motion or adnexal tenderness.   Musculoskeletal exam: Full ROM of spine, hips , shoulders and knees. No deformity ,swelling or crepitus noted. No muscle wasting or atrophy.   Neurologic: Cranial nerves 2 to 12 intact. Power, tone ,sensation and reflexes normal throughout. No disturbance in gait. No tremor.  Skin: Intact, no ulceration, erythema , scaling or rash noted. Pigmentation normal throughout  Psych; Normal mood and affect. Judgement and concentration normal        Assessment & Plan:  Annual physical exam Annual exam as documented. Counseling done  re healthy lifestyle involving commitment to 150 minutes exercise per week, heart healthy diet, and attaining healthy weight.The importance of adequate sleep also discussed. Regular seat belt use and safe storage  of firearms if patient has them, is also discussed. Changes in health habits are decided on by the patient with goals and time frames  set for achieving them. Immunization and cancer screening needs are specifically addressed at this visit.   Need for prophylactic vaccination and inoculation against influenza Vaccine administered at visit.   Need for Tdap vaccination Vaccine administered at visit.

## 2014-08-04 ENCOUNTER — Ambulatory Visit (AMBULATORY_SURGERY_CENTER): Payer: Self-pay | Admitting: *Deleted

## 2014-08-04 VITALS — Ht 61.0 in | Wt 201.6 lb

## 2014-08-04 DIAGNOSIS — Z1211 Encounter for screening for malignant neoplasm of colon: Secondary | ICD-10-CM

## 2014-08-04 MED ORDER — MOVIPREP 100 G PO SOLR: ORAL | Status: DC

## 2014-08-04 NOTE — Progress Notes (Signed)
No allergies to eggs or soy. No problems with anesthesia.  Pt given Emmi instructions for colonoscopy  No oxygen use  TAKES PHENTERMINE.  INSTRUCTED TO STOP 10 DAYS BEFORE PROCEDURE  Pt says she had a colonoscopy 10 years ago but does not know doctor that performed procedure.  She says she did not have polyps and was told to have a colonoscopy in 10 years.

## 2014-08-05 ENCOUNTER — Encounter: Payer: Self-pay | Admitting: Gastroenterology

## 2014-08-16 ENCOUNTER — Other Ambulatory Visit: Payer: Self-pay | Admitting: Family Medicine

## 2014-08-18 ENCOUNTER — Encounter: Payer: Self-pay | Admitting: Gastroenterology

## 2014-08-18 ENCOUNTER — Ambulatory Visit (AMBULATORY_SURGERY_CENTER): Payer: BC Managed Care – PPO | Admitting: Gastroenterology

## 2014-08-18 VITALS — BP 156/81 | HR 65 | Temp 96.3°F | Resp 20 | Ht 61.0 in | Wt 201.0 lb

## 2014-08-18 DIAGNOSIS — D123 Benign neoplasm of transverse colon: Secondary | ICD-10-CM

## 2014-08-18 DIAGNOSIS — Z1211 Encounter for screening for malignant neoplasm of colon: Secondary | ICD-10-CM

## 2014-08-18 DIAGNOSIS — K5140 Inflammatory polyps of colon without complications: Secondary | ICD-10-CM

## 2014-08-18 MED ORDER — SODIUM CHLORIDE 0.9 % IV SOLN: 500.0000 mL | INTRAVENOUS | Status: DC

## 2014-08-18 NOTE — Progress Notes (Signed)
Pt's has not taken her b/p meds since 08-15-14, she ran out of rx.  Pt called her md office to refill and she said she was told they would have to speak with the md first.  B/P  150/100 left arm.  168/108 right arm.  Blood pressure reading were reported to [facility] Monday, CRNA and he understands the last dose take was on Friday. maw

## 2014-08-18 NOTE — Patient Instructions (Signed)
Discharge instructions given. Handouts on polyps and hemorrhoids. Hold aspirin and all other NSAIDS for 2 weeks. Resume previous medications. YOU HAD AN ENDOSCOPIC PROCEDURE TODAY AT [facility]: Refer to the procedure report that was given to you for any specific questions about what was found during the examination.  If the procedure report does not answer your questions, please call your gastroenterologist to clarify.  If you requested that your care partner not be given the details of your procedure findings, then the procedure report has been included in a sealed envelope for you to review at your convenience later.  YOU SHOULD EXPECT: Some feelings of bloating in the abdomen. Passage of more gas than usual.  Walking can help get rid of the air that was put into your GI tract during the procedure and reduce the bloating. If you had a lower endoscopy (such as a colonoscopy or flexible sigmoidoscopy) you may notice spotting of blood in your stool or on the toilet paper. If you underwent a bowel prep for your procedure, then you may not have a normal bowel movement for a few days.  DIET: Your first meal following the procedure should be a light meal and then it is ok to progress to your normal diet.  A half-sandwich or bowl of soup is an example of a good first meal.  Heavy or fried foods are harder to digest and may make you feel nauseous or bloated.  Likewise meals heavy in dairy and vegetables can cause extra gas to form and this can also increase the bloating.  Drink plenty of fluids but you should avoid alcoholic beverages for 24 hours.  ACTIVITY: Your care partner should take you home directly after the procedure.  You should plan to take it easy, moving slowly for the rest of the day.  You can resume normal activity the day after the procedure however you should NOT DRIVE or use heavy machinery for 24 hours (because of the sedation medicines used during the test).    SYMPTOMS TO  REPORT IMMEDIATELY: A gastroenterologist can be reached at any hour.  During normal business hours, 8:30 AM to 5:00 PM Monday through Friday, call [phone].  After hours and on weekends, please call the GI answering service at [phone] who will take a message and have the physician on call contact you.   Following lower endoscopy (colonoscopy or flexible sigmoidoscopy):  Excessive amounts of blood in the stool  Significant tenderness or worsening of abdominal pains  Swelling of the abdomen that is new, acute  Fever of 100F or higher  FOLLOW UP: If any biopsies were taken you will be contacted by phone or by letter within the next 1-3 weeks.  Call your gastroenterologist if you have not heard about the biopsies in 3 weeks.  Our staff will call the home number listed on your records the next business day following your procedure to check on you and address any questions or concerns that you may have at that time regarding the information given to you following your procedure. This is a courtesy call and so if there is no answer at the home number and we have not heard from you through the emergency physician on call, we will assume that you have returned to your regular daily activities without incident.  SIGNATURES/CONFIDENTIALITY: You and/or your care partner have signed paperwork which will be entered into your electronic medical record.  These signatures attest to the fact that that the information  above on your After Visit Summary has been reviewed and is understood.  Full responsibility of the confidentiality of this discharge information lies with you and/or your care-partner. 

## 2014-08-18 NOTE — Progress Notes (Signed)
Called to room to assist during endoscopic procedure.  Patient ID and intended procedure confirmed with present staff. Received instructions for my participation in the procedure from the performing physician.  

## 2014-08-18 NOTE — Progress Notes (Signed)
Report to PACU, RN, vss, BBS= Clear.  

## 2014-08-18 NOTE — Op Note (Signed)
[place]  [address] [place], [id number]   COLONOSCOPY PROCEDURE REPORT  PATIENT: Megan Morales, Megan Morales  MR#: [id number] BIRTHDATE: June 16, 1956 , 58  yrs. old GENDER: female ENDOSCOPIST: Ladene Artist, MD, [facility] REFERRED AP:OLIDCVUD Moshe Cipro, M.D. PROCEDURE DATE:  08/18/2014 PROCEDURE:   Colonoscopy with snare polypectomy First Screening Colonoscopy - Avg.  risk and is 50 yrs.  old or older Yes.  Prior Negative Screening - Now for repeat screening. N/A  History of Adenoma - Now for follow-up colonoscopy & has been > or = to 3 yrs.  N/A  Polyps Removed Today? Yes. ASA CLASS:   Class II INDICATIONS:average risk for colorectal cancer. MEDICATIONS: Monitored anesthesia care and Propofol 270 mg IV DESCRIPTION OF PROCEDURE:   After the risks benefits and alternatives of the procedure were thoroughly explained, informed consent was obtained.  The digital rectal exam revealed no abnormalities of the rectum.   The LB [id number]  endoscope was introduced through the anus and advanced to the cecum, which was identified by both the appendix and ileocecal valve. No adverse events experienced with a tortuous and redundant colon.   The quality of the prep was good, using MoviPrep  The instrument was then slowly withdrawn as the colon was fully examined.  COLON FINDINGS: A pedunculated polyp measuring 10 mm in size was found in the transverse colon.  A polypectomy was performed using snare cautery.  The resection was complete, the polyp tissue was completely retrieved and sent to histology.   The examination was otherwise normal.  Retroflexed views revealed internal Grade II hemorrhoids. The time to cecum=3 minutes 47 seconds.  Withdrawal time=12 minutes 25 seconds.  The scope was withdrawn and the procedure completed.  COMPLICATIONS: There were no immediate complications.  ENDOSCOPIC IMPRESSION: 1.   Pedunculated polyp in the transverse colon; polypectomy performed  using snare cautery 2.    Grade Il internal hemorrhoids  RECOMMENDATIONS: 1.  Hold Aspirin and all other NSAIDS for 2 weeks. 2.  Await pathology results 3.  Repeat colonoscopy in 5 years if polyp adenomatous; otherwise 10 years  eSigned:  Ladene Artist, MD, [facility] 08/18/2014 10:28 AM

## 2014-08-19 ENCOUNTER — Telehealth: Payer: Self-pay

## 2014-08-19 NOTE — Telephone Encounter (Signed)
  Follow up Call-  Call back number 08/18/2014  Post procedure Call Back phone  # [phone] cell  Permission to leave phone message No     Patient questions:  Do you have a fever, pain , or abdominal swelling? Yes.   Pain Score  0 *  Have you tolerated food without any problems? Yes.    Have you been able to return to your normal activities? Yes.    Do you have any questions about your discharge instructions: Diet   No. Medications  No. Follow up visit  No.  Do you have questions or concerns about your Care? No.  Actions: * If pain score is 4 or above: No action needed, pain <4.  No problems per the pt. maw

## 2014-08-22 ENCOUNTER — Encounter: Payer: Self-pay | Admitting: Gastroenterology

## 2014-09-17 ENCOUNTER — Ambulatory Visit: Payer: BC Managed Care – PPO | Admitting: Family Medicine

## 2014-12-09 ENCOUNTER — Telehealth: Payer: Self-pay

## 2014-12-09 DIAGNOSIS — R7303 Prediabetes: Secondary | ICD-10-CM

## 2014-12-09 DIAGNOSIS — I10 Essential (primary) hypertension: Secondary | ICD-10-CM

## 2014-12-09 NOTE — Telephone Encounter (Signed)
Labs ordered to have done prior to visit.

## 2015-01-07 LAB — BASIC METABOLIC PANEL
BUN: 15 mg/dL (ref 6–23)
CHLORIDE: 104 meq/L (ref 96–112)
CO2: 29 meq/L (ref 19–32)
CREATININE: 0.78 mg/dL (ref 0.50–1.10)
Calcium: 8.9 mg/dL (ref 8.4–10.5)
Glucose, Bld: 80 mg/dL (ref 70–99)
Potassium: 3.8 mEq/L (ref 3.5–5.3)
Sodium: 141 mEq/L (ref 135–145)

## 2015-01-07 LAB — CBC
HCT: 39.5 % (ref 36.0–46.0)
Hemoglobin: 13.2 g/dL (ref 12.0–15.0)
MCH: 30.6 pg (ref 26.0–34.0)
MCHC: 33.4 g/dL (ref 30.0–36.0)
MCV: 91.4 fL (ref 78.0–100.0)
MPV: 10.5 fL (ref 8.6–12.4)
Platelets: 204 10*3/uL (ref 150–400)
RBC: 4.32 MIL/uL (ref 3.87–5.11)
RDW: 13.9 % (ref 11.5–15.5)
WBC: 5.1 10*3/uL (ref 4.0–10.5)

## 2015-01-07 LAB — HEMOGLOBIN A1C
HEMOGLOBIN A1C: 6 % — AB (ref <5.7)
Mean Plasma Glucose: 126 mg/dL — ABNORMAL HIGH (ref <117)

## 2015-01-08 ENCOUNTER — Ambulatory Visit (INDEPENDENT_AMBULATORY_CARE_PROVIDER_SITE_OTHER): Payer: BLUE CROSS/BLUE SHIELD | Admitting: Family Medicine

## 2015-01-08 ENCOUNTER — Encounter: Payer: Self-pay | Admitting: Family Medicine

## 2015-01-08 VITALS — BP 144/84 | HR 80 | Resp 16 | Ht 61.0 in | Wt 213.0 lb

## 2015-01-08 DIAGNOSIS — R7309 Other abnormal glucose: Secondary | ICD-10-CM

## 2015-01-08 DIAGNOSIS — I10 Essential (primary) hypertension: Secondary | ICD-10-CM | POA: Diagnosis not present

## 2015-01-08 DIAGNOSIS — E785 Hyperlipidemia, unspecified: Secondary | ICD-10-CM

## 2015-01-08 DIAGNOSIS — E8881 Metabolic syndrome: Secondary | ICD-10-CM

## 2015-01-08 DIAGNOSIS — R7303 Prediabetes: Secondary | ICD-10-CM

## 2015-01-08 DIAGNOSIS — E669 Obesity, unspecified: Secondary | ICD-10-CM | POA: Diagnosis not present

## 2015-01-08 MED ORDER — PHENTERMINE HCL 37.5 MG PO TBDP: 1.0000 | ORAL_TABLET | Freq: Every day | ORAL | Status: DC

## 2015-01-08 MED ORDER — HYDROCHLOROTHIAZIDE 12.5 MG PO CAPS: 12.5000 mg | ORAL_CAPSULE | Freq: Every day | ORAL | Status: DC

## 2015-01-08 NOTE — Assessment & Plan Note (Addendum)
UnControlled, add HCTZ 12.5 mg daily DASH diet and commitment to daily physical activity for a minimum of 30 minutes discussed and encouraged, as a part of hypertension management. The importance of attaining a healthy weight is also discussed.  BP/Weight 01/08/2015 08/18/2014 08/04/2014 05/26/2014 01/21/2014 25/63/8937 12/05/2874  Systolic BP 811 572 - 620 355 974 163  Diastolic BP 84 81 - 84 90 84 76  Wt. (Lbs) 213 201 201.6 197 199 202.8 207.08  BMI 40.27 38 38.11 37.24 37.62 38.34 39.15    CMP Latest Ref Rng 01/06/2015 05/23/2014 08/12/2013  Glucose 70 - 99 mg/dL 80 91 87  BUN 6 - 23 mg/dL 15 13 17   Creatinine 0.50 - 1.10 mg/dL 0.78 0.86 0.90  Sodium 135 - 145 mEq/L 141 141 138  Potassium 3.5 - 5.3 mEq/L 3.8 3.8 4.3  Chloride 96 - 112 mEq/L 104 106 105  CO2 19 - 32 mEq/L 29 27 29   Calcium 8.4 - 10.5 mg/dL 8.9 8.9 9.1

## 2015-01-08 NOTE — Assessment & Plan Note (Signed)
The increased risk of cardiovascular disease associated with this diagnosis, and the need to consistently work on lifestyle to change this is discussed. Following  a  heart healthy diet ,commitment to 30 minutes of exercise at least 5 days per week, as well as control of blood sugar and cholesterol , and achieving a healthy weight are all the areas to be addressed .  

## 2015-01-08 NOTE — Assessment & Plan Note (Signed)
Deteriorated Patient educated about the importance of limiting  Carbohydrate intake , the need to commit to daily physical activity for a minimum of 30 minutes , and to commit weight loss. The fact that changes in all these areas will reduce or eliminate all together the development of diabetes is stressed.   Patient educated about the importance of limiting  Carbohydrate intake , the need to commit to daily physical activity for a minimum of 30 minutes , and to commit weight loss. The fact that changes in all these areas will reduce or eliminate all together the development of diabetes is stressed.   Diabetic Labs Latest Ref Rng 01/06/2015 05/23/2014 01/20/2014 08/12/2013 12/31/2012  HbA1c <5.7 % 6.0(H) 5.8(H) 5.8(H) 5.9(H) 5.7(H)  Chol 0 - 200 mg/dL - 160 - - 169  HDL >39 mg/dL - 54 - - 58  Calc LDL 0 - 99 mg/dL - 97 - - 101(H)  Triglycerides <150 mg/dL - 47 - - 50  Creatinine 0.50 - 1.10 mg/dL 0.78 0.86 - 0.90 0.85   BP/Weight 01/08/2015 08/18/2014 08/04/2014 05/26/2014 01/21/2014 93/81/8299 12/08/1694  Systolic BP 789 381 - 017 510 258 527  Diastolic BP 84 81 - 84 90 84 76  Wt. (Lbs) 213 201 201.6 197 199 202.8 207.08  BMI 40.27 38 38.11 37.24 37.62 38.34 39.15   No flowsheet data found.

## 2015-01-08 NOTE — Patient Instructions (Signed)
F/u in 4 month, call if you need me before  New additional med for blood pressure is HCTZ one daily  You will receive a list of food rich in potassium   It is important that you exercise regularly at least 30 minutes 5 times a week. If you develop chest pain, have severe difficulty breathing, or feel very tired, stop exercising immediately and seek medical attention    Weigh every 2 weeks   Weight loss goal of 2.5  to 4 pounds per month  Start half phentermine to help with weight loss  Fasting lipid, chem 7, HBa1C, TSH  In 4 month  All the best  Thanks for choosing [facility], we consider it a privelige to serve you.

## 2015-01-08 NOTE — Progress Notes (Signed)
Megan Morales     MRN: [id number]      DOB: 02-16-1956   HPI Megan Morales is here for follow up and re-evaluation of chronic medical conditions, medication management and review of any available recent lab and radiology data.  Preventive health is updated, specifically  Cancer screening and Immunization.   Questions or concerns regarding consultations or procedures which the PT has had in the interim are  addressed. The PT denies any adverse reactions to current medications since the last visit.  C/o excessive weight gain, vows to change lifestyle to reverse this.  There are no specific complaints   ROS Denies recent fever or chills. Denies sinus pressure, nasal congestion, ear pain or sore throat. Denies chest congestion, productive cough or wheezing. Denies chest pains, palpitations and leg swelling Denies abdominal pain, nausea, vomiting,diarrhea or constipation.   Denies dysuria, frequency, hesitancy or incontinence. Denies joint pain, swelling and limitation in mobility. Denies headaches, seizures, numbness, or tingling. Denies depression, anxiety or insomnia. Denies skin break down or rash.   PE  BP 144/84 mmHg  Pulse 80  Resp 16  Ht 5\' 1"  (1.549 m)  Wt 213 lb (96.616 kg)  BMI 40.27 kg/m2  SpO2 100%  Patient alert and oriented and in no cardiopulmonary distress.  HEENT: No facial asymmetry, EOMI,   oropharynx pink and moist.  Neck supple no JVD, no mass.  Chest: Clear to auscultation bilaterally.  CVS: S1, S2 no murmurs, no S3.Regular rate.  ABD: Soft non tender.   Ext: No edema  MS: Adequate ROM spine, shoulders, hips and knees.  Skin: Intact, no ulcerations or rash noted.  Psych: Good eye contact, normal affect. Memory intact not anxious or depressed appearing.  CNS: CN 2-12 intact, power,  normal throughout.no focal deficits noted.   Assessment & Plan  ESSENTIAL HYPERTENSION, BENIGN UnControlled, add HCTZ 12.5 mg daily DASH diet and commitment to  daily physical activity for a minimum of 30 minutes discussed and encouraged, as a part of hypertension management. The importance of attaining a healthy weight is also discussed.  BP/Weight 01/08/2015 08/18/2014 08/04/2014 05/26/2014 01/21/2014 76/73/4193 04/11/239  Systolic BP 973 532 - 992 426 834 196  Diastolic BP 84 81 - 84 90 84 76  Wt. (Lbs) 213 201 201.6 197 199 202.8 207.08  BMI 40.27 38 38.11 37.24 37.62 38.34 39.15    CMP Latest Ref Rng 01/06/2015 05/23/2014 08/12/2013  Glucose 70 - 99 mg/dL 80 91 87  BUN 6 - 23 mg/dL 15 13 17   Creatinine 0.50 - 1.10 mg/dL 0.78 0.86 0.90  Sodium 135 - 145 mEq/L 141 141 138  Potassium 3.5 - 5.3 mEq/L 3.8 3.8 4.3  Chloride 96 - 112 mEq/L 104 106 105  CO2 19 - 32 mEq/L 29 27 29   Calcium 8.4 - 10.5 mg/dL 8.9 8.9 9.1        Prediabetes Deteriorated Patient educated about the importance of limiting  Carbohydrate intake , the need to commit to daily physical activity for a minimum of 30 minutes , and to commit weight loss. The fact that changes in all these areas will reduce or eliminate all together the development of diabetes is stressed.   Patient educated about the importance of limiting  Carbohydrate intake , the need to commit to daily physical activity for a minimum of 30 minutes , and to commit weight loss. The fact that changes in all these areas will reduce or eliminate all together the development of diabetes is stressed.  Diabetic Labs Latest Ref Rng 01/06/2015 05/23/2014 01/20/2014 08/12/2013 12/31/2012  HbA1c <5.7 % 6.0(H) 5.8(H) 5.8(H) 5.9(H) 5.7(H)  Chol 0 - 200 mg/dL - 160 - - 169  HDL >39 mg/dL - 54 - - 58  Calc LDL 0 - 99 mg/dL - 97 - - 101(H)  Triglycerides <150 mg/dL - 47 - - 50  Creatinine 0.50 - 1.10 mg/dL 0.78 0.86 - 0.90 0.85   BP/Weight 01/08/2015 08/18/2014 08/04/2014 05/26/2014 01/21/2014 63/14/9702 03/05/7857  Systolic BP 850 277 - 412 878 676 720  Diastolic BP 84 81 - 84 90 84 76  Wt. (Lbs) 213 201 201.6 197 199 202.8 207.08    BMI 40.27 38 38.11 37.24 37.62 38.34 39.15   No flowsheet data found.      Obesity Deteriorated. Patient re-educated about  the importance of commitment to a  minimum of 150 minutes of exercise per week.  The importance of healthy food choices with portion control discussed. Encouraged to start a food diary, count calories and to consider  joining a support group. Sample diet sheets offered. Goals set by the patient for the next several months.   Wt Readings from Last 3 Encounters:  01/08/15 213 lb (96.616 kg)  08/18/14 201 lb (91.173 kg)  08/04/14 201 lb 9.6 oz (91.445 kg)    Body mass index is 40.27 kg/(m^2).  Current exercise per week 40 minutes.Will re commit to at least 150 mins per hr    Dyslipidemia Hyperlipidemia:Low fat diet discussed and encouraged.  CMP Latest Ref Rng 01/06/2015 05/23/2014 08/12/2013  Glucose 70 - 99 mg/dL 80 91 87  BUN 6 - 23 mg/dL 15 13 17   Creatinine 0.50 - 1.10 mg/dL 0.78 0.86 0.90  Sodium 135 - 145 mEq/L 141 141 138  Potassium 3.5 - 5.3 mEq/L 3.8 3.8 4.3  Chloride 96 - 112 mEq/L 104 106 105  CO2 19 - 32 mEq/L 29 27 29   Calcium 8.4 - 10.5 mg/dL 8.9 8.9 9.1    Lipid Panel     Component Value Date/Time   CHOL 160 05/23/2014 0837   TRIG 47 05/23/2014 0837   HDL 54 05/23/2014 0837   CHOLHDL 3.0 05/23/2014 0837   VLDL 9 05/23/2014 0837   LDLCALC 97 05/23/2014 0837      Updated lab needed at/ before next visit.    Metabolic syndrome The increased risk of cardiovascular disease associated with this diagnosis, and the need to consistently work on lifestyle to change this is discussed. Following  a  heart healthy diet ,commitment to 30 minutes of exercise at least 5 days per week, as well as control of blood sugar and cholesterol , and achieving a healthy weight are all the areas to be addressed .

## 2015-01-08 NOTE — Assessment & Plan Note (Signed)
Hyperlipidemia:Low fat diet discussed and encouraged.  CMP Latest Ref Rng 01/06/2015 05/23/2014 08/12/2013  Glucose 70 - 99 mg/dL 80 91 87  BUN 6 - 23 mg/dL 15 13 17   Creatinine 0.50 - 1.10 mg/dL 0.78 0.86 0.90  Sodium 135 - 145 mEq/L 141 141 138  Potassium 3.5 - 5.3 mEq/L 3.8 3.8 4.3  Chloride 96 - 112 mEq/L 104 106 105  CO2 19 - 32 mEq/L 29 27 29   Calcium 8.4 - 10.5 mg/dL 8.9 8.9 9.1    Lipid Panel     Component Value Date/Time   CHOL 160 05/23/2014 0837   TRIG 47 05/23/2014 0837   HDL 54 05/23/2014 0837   CHOLHDL 3.0 05/23/2014 0837   VLDL 9 05/23/2014 0837   LDLCALC 97 05/23/2014 0837      Updated lab needed at/ before next visit.

## 2015-01-08 NOTE — Assessment & Plan Note (Signed)
Deteriorated. Patient re-educated about  the importance of commitment to a  minimum of 150 minutes of exercise per week.  The importance of healthy food choices with portion control discussed. Encouraged to start a food diary, count calories and to consider  joining a support group. Sample diet sheets offered. Goals set by the patient for the next several months.   Wt Readings from Last 3 Encounters:  01/08/15 213 lb (96.616 kg)  08/18/14 201 lb (91.173 kg)  08/04/14 201 lb 9.6 oz (91.445 kg)    Body mass index is 40.27 kg/(m^2).  Current exercise per week 40 minutes.Will re commit to at least 150 mins per hr

## 2015-03-24 ENCOUNTER — Other Ambulatory Visit: Payer: Self-pay | Admitting: Family Medicine

## 2015-03-24 DIAGNOSIS — Z1231 Encounter for screening mammogram for malignant neoplasm of breast: Secondary | ICD-10-CM

## 2015-03-27 ENCOUNTER — Ambulatory Visit
Admission: RE | Admit: 2015-03-27 | Discharge: 2015-03-27 | Disposition: A | Discharge status: Home / Self Care | Payer: BLUE CROSS/BLUE SHIELD | Source: Ambulatory Visit | Attending: Family Medicine | Admitting: Family Medicine

## 2015-03-27 DIAGNOSIS — Z1231 Encounter for screening mammogram for malignant neoplasm of breast: Secondary | ICD-10-CM

## 2015-05-08 LAB — HEMOGLOBIN A1C
HEMOGLOBIN A1C: 5.8 % — AB (ref <5.7)
Mean Plasma Glucose: 120 mg/dL — ABNORMAL HIGH (ref <117)

## 2015-05-09 LAB — TSH: TSH: 2.416 u[IU]/mL (ref 0.350–4.500)

## 2015-05-09 LAB — BASIC METABOLIC PANEL
BUN: 18 mg/dL (ref 7–25)
CALCIUM: 9 mg/dL (ref 8.6–10.4)
CO2: 28 mmol/L (ref 20–31)
Chloride: 104 mmol/L (ref 98–110)
Creat: 0.81 mg/dL (ref 0.50–1.05)
Glucose, Bld: 80 mg/dL (ref 65–99)
POTASSIUM: 4 mmol/L (ref 3.5–5.3)
SODIUM: 140 mmol/L (ref 135–146)

## 2015-05-09 LAB — LIPID PANEL
CHOLESTEROL: 155 mg/dL (ref 125–200)
HDL: 57 mg/dL (ref >=46)
LDL CALC: 88 mg/dL (ref <130)
TRIGLYCERIDES: 50 mg/dL (ref <150)
Total CHOL/HDL Ratio: 2.7 Ratio (ref <=5.0)
VLDL: 10 mg/dL (ref <30)

## 2015-05-11 ENCOUNTER — Encounter: Payer: Self-pay | Admitting: Family Medicine

## 2015-05-11 ENCOUNTER — Ambulatory Visit (INDEPENDENT_AMBULATORY_CARE_PROVIDER_SITE_OTHER): Payer: BLUE CROSS/BLUE SHIELD | Admitting: Family Medicine

## 2015-05-11 VITALS — BP 126/70 | HR 80 | Resp 18 | Ht 61.0 in | Wt 207.0 lb

## 2015-05-11 DIAGNOSIS — R7303 Prediabetes: Secondary | ICD-10-CM

## 2015-05-11 DIAGNOSIS — R7309 Other abnormal glucose: Secondary | ICD-10-CM | POA: Diagnosis not present

## 2015-05-11 DIAGNOSIS — I10 Essential (primary) hypertension: Secondary | ICD-10-CM | POA: Diagnosis not present

## 2015-05-11 DIAGNOSIS — Z1159 Encounter for screening for other viral diseases: Secondary | ICD-10-CM | POA: Diagnosis not present

## 2015-05-11 DIAGNOSIS — E669 Obesity, unspecified: Secondary | ICD-10-CM

## 2015-05-11 DIAGNOSIS — E8881 Metabolic syndrome: Secondary | ICD-10-CM

## 2015-05-11 NOTE — Assessment & Plan Note (Signed)
Controlled, no change in medication DASH diet and commitment to daily physical activity for a minimum of 30 minutes discussed and encouraged, as a part of hypertension management. The importance of attaining a healthy weight is also discussed.  BP/Weight 05/11/2015 01/08/2015 08/18/2014 08/04/2014 05/26/2014 01/21/2014 37/62/8315  Systolic BP 176 160 737 - 106 269 485  Diastolic BP 70 84 81 - 84 90 84  Wt. (Lbs) 207 213 201 201.6 197 199 202.8  BMI 39.13 40.27 38 38.11 37.24 37.62 38.34

## 2015-05-11 NOTE — Assessment & Plan Note (Signed)
The increased risk of cardiovascular disease associated with this diagnosis, and the need to consistently work on lifestyle to change this is discussed. Following  a  heart healthy diet ,commitment to 30 minutes of exercise at least 5 days per week, as well as control of blood sugar and cholesterol , and achieving a healthy weight are all the areas to be addressed .  

## 2015-05-11 NOTE — Assessment & Plan Note (Signed)
Improved. Patient re-educated about  the importance of commitment to a  minimum of 150 minutes of exercise per week.  The importance of healthy food choices with portion control discussed. Encouraged to start a food diary, count calories and to consider  joining a support group. Sample diet sheets offered. Goals set by the patient for the next several months.   Weight /BMI 05/11/2015 01/08/2015 08/18/2014  WEIGHT 207 lb 213 lb 201 lb  HEIGHT 5\' 1"  5\' 1"  5\' 1"   BMI 39.13 kg/m2 40.27 kg/m2 38 kg/m2    Current exercise per week 60 minutes.

## 2015-05-11 NOTE — Patient Instructions (Addendum)
F/u in 4 months with rectal, call if you need me before  CONGRATS for improved labs and weight loss, excellent cholesterol  It is important that you exercise regularly at least 30 minutes 7 times a week. If you develop chest pain, have severe difficulty breathing, or feel very tired, stop exercising immediately and seek medical attention   No changes in medication   Weight loss goal of 8 pounds, to reach yo 199 lb  Non fast HBA1C, chem 7  .

## 2015-05-11 NOTE — Progress Notes (Signed)
Megan Morales     MRN: [id number]      DOB: 1956-07-21   HPI Megan Morales is here for follow up and re-evaluation of chronic medical conditions, medication management and review of any available recent lab and radiology data.  Preventive health is updated, specifically  Cancer screening and Immunization.   Questions or concerns regarding consultations or procedures which the PT has had in the interim are  addressed. The PT denies any adverse reactions to current medications since the last visit.  There are no new concerns. Needs to commit to regular exercise There are no specific complaints   ROS Denies recent fever or chills. Denies sinus pressure, nasal congestion, ear pain or sore throat. Denies chest congestion, productive cough or wheezing. Denies chest pains, palpitations and leg swelling Denies abdominal pain, nausea, vomiting,diarrhea or constipation.   Denies dysuria, frequency, hesitancy or incontinence. Denies joint pain, swelling and limitation in mobility. Denies headaches, seizures, numbness, or tingling. Denies depression, anxiety or insomnia. Denies skin break down or rash.   PE  BP 126/70 mmHg  Pulse 80  Resp 18  Ht 5\' 1"  (1.549 m)  Wt 207 lb (93.895 kg)  BMI 39.13 kg/m2  SpO2 99%  Patient alert and oriented and in no cardiopulmonary distress.  HEENT: No facial asymmetry, EOMI,   oropharynx pink and moist.  Neck supple no JVD, no mass.  Chest: Clear to auscultation bilaterally.  CVS: S1, S2 no murmurs, no S3.Regular rate.  ABD: Soft non tender.   Ext: No edema  MS: Adequate ROM spine, shoulders, hips and knees.  Skin: Intact, no ulcerations or rash noted.  Psych: Good eye contact, normal affect. Memory intact not anxious or depressed appearing.  CNS: CN 2-12 intact, power,  normal throughout.no focal deficits noted.   Assessment & Plan   ESSENTIAL HYPERTENSION, BENIGN Controlled, no change in medication DASH diet and commitment to daily  physical activity for a minimum of 30 minutes discussed and encouraged, as a part of hypertension management. The importance of attaining a healthy weight is also discussed.  BP/Weight 05/11/2015 01/08/2015 08/18/2014 08/04/2014 05/26/2014 01/21/2014 94/49/6759  Systolic BP 163 846 659 - 935 701 779  Diastolic BP 70 84 81 - 84 90 84  Wt. (Lbs) 207 213 201 201.6 197 199 202.8  BMI 39.13 40.27 38 38.11 37.24 37.62 38.34        Prediabetes  Improved Patient educated about the importance of limiting  Carbohydrate intake , the need to commit to daily physical activity for a minimum of 30 minutes , and to commit weight loss. The fact that changes in all these areas will reduce or eliminate all together the development of diabetes is stressed.   Diabetic Labs Latest Ref Rng 05/08/2015 01/06/2015 05/23/2014 01/20/2014 08/12/2013  HbA1c <5.7 % 5.8(H) 6.0(H) 5.8(H) 5.8(H) 5.9(H)  Chol 125 - 200 mg/dL 155 - 160 - -  HDL >=46 mg/dL 57 - 54 - -  Calc LDL <130 mg/dL 88 - 97 - -  Triglycerides <150 mg/dL 50 - 47 - -  Creatinine 0.50 - 1.05 mg/dL 0.81 0.78 0.86 - 0.90   BP/Weight 05/11/2015 01/08/2015 08/18/2014 08/04/2014 05/26/2014 01/21/2014 39/12/90  Systolic BP 330 076 226 - 333 545 625  Diastolic BP 70 84 81 - 84 90 84  Wt. (Lbs) 207 213 201 201.6 197 199 202.8  BMI 39.13 40.27 38 38.11 37.24 37.62 38.34   No flowsheet data found.     Obesity Improved. Patient re-educated about  the importance of commitment to a  minimum of 150 minutes of exercise per week.  The importance of healthy food choices with portion control discussed. Encouraged to start a food diary, count calories and to consider  joining a support group. Sample diet sheets offered. Goals set by the patient for the next several months.   Weight /BMI 05/11/2015 01/08/2015 08/18/2014  WEIGHT 207 lb 213 lb 201 lb  HEIGHT 5\' 1"  5\' 1"  5\' 1"   BMI 39.13 kg/m2 40.27 kg/m2 38 kg/m2    Current exercise per week 60 minutes.   Metabolic  syndrome The increased risk of cardiovascular disease associated with this diagnosis, and the need to consistently work on lifestyle to change this is discussed. Following  a  heart healthy diet ,commitment to 30 minutes of exercise at least 5 days per week, as well as control of blood sugar and cholesterol , and achieving a healthy weight are all the areas to be addressed .

## 2015-05-11 NOTE — Assessment & Plan Note (Addendum)
  Improved Patient educated about the importance of limiting  Carbohydrate intake , the need to commit to daily physical activity for a minimum of 30 minutes , and to commit weight loss. The fact that changes in all these areas will reduce or eliminate all together the development of diabetes is stressed.   Diabetic Labs Latest Ref Rng 05/08/2015 01/06/2015 05/23/2014 01/20/2014 08/12/2013  HbA1c <5.7 % 5.8(H) 6.0(H) 5.8(H) 5.8(H) 5.9(H)  Chol 125 - 200 mg/dL 155 - 160 - -  HDL >=46 mg/dL 57 - 54 - -  Calc LDL <130 mg/dL 88 - 97 - -  Triglycerides <150 mg/dL 50 - 47 - -  Creatinine 0.50 - 1.05 mg/dL 0.81 0.78 0.86 - 0.90   BP/Weight 05/11/2015 01/08/2015 08/18/2014 08/04/2014 05/26/2014 01/21/2014 77/37/3668  Systolic BP 159 470 761 - 518 343 735  Diastolic BP 70 84 81 - 84 90 84  Wt. (Lbs) 207 213 201 201.6 197 199 202.8  BMI 39.13 40.27 38 38.11 37.24 37.62 38.34   No flowsheet data found.

## 2015-09-10 ENCOUNTER — Ambulatory Visit: Payer: BLUE CROSS/BLUE SHIELD | Admitting: Family Medicine

## 2015-11-17 ENCOUNTER — Other Ambulatory Visit: Payer: Self-pay

## 2015-11-17 MED ORDER — BENAZEPRIL HCL 40 MG PO TABS: 40.0000 mg | ORAL_TABLET | Freq: Every day | ORAL | Status: DC

## 2015-11-17 MED ORDER — AMLODIPINE BESYLATE 10 MG PO TABS: 10.0000 mg | ORAL_TABLET | Freq: Every day | ORAL | Status: DC

## 2016-02-05 ENCOUNTER — Other Ambulatory Visit: Payer: Self-pay

## 2016-02-05 ENCOUNTER — Telehealth: Payer: Self-pay

## 2016-02-05 DIAGNOSIS — I10 Essential (primary) hypertension: Secondary | ICD-10-CM

## 2016-02-05 DIAGNOSIS — R7303 Prediabetes: Secondary | ICD-10-CM

## 2016-02-05 DIAGNOSIS — Z1159 Encounter for screening for other viral diseases: Secondary | ICD-10-CM

## 2016-02-05 MED ORDER — HYDROCHLOROTHIAZIDE 12.5 MG PO CAPS
12.5000 mg | ORAL_CAPSULE | Freq: Every day | ORAL | Status: DC
Start: 2016-02-05 — End: 2016-02-18

## 2016-02-05 MED ORDER — BENAZEPRIL HCL 40 MG PO TABS: 40.0000 mg | ORAL_TABLET | Freq: Every day | ORAL | Status: DC

## 2016-02-05 MED ORDER — AMLODIPINE BESYLATE 10 MG PO TABS: 10.0000 mg | ORAL_TABLET | Freq: Every day | ORAL | Status: DC

## 2016-02-05 NOTE — Telephone Encounter (Signed)
Labs prior to upcoming appointment

## 2016-02-16 DIAGNOSIS — I10 Essential (primary) hypertension: Secondary | ICD-10-CM | POA: Diagnosis not present

## 2016-02-16 DIAGNOSIS — R7309 Other abnormal glucose: Secondary | ICD-10-CM | POA: Diagnosis not present

## 2016-02-16 DIAGNOSIS — R7303 Prediabetes: Secondary | ICD-10-CM | POA: Diagnosis not present

## 2016-02-16 DIAGNOSIS — Z1159 Encounter for screening for other viral diseases: Secondary | ICD-10-CM | POA: Diagnosis not present

## 2016-02-16 LAB — HEMOGLOBIN A1C
Hgb A1c MFr Bld: 5.9 % — ABNORMAL HIGH (ref <5.7)
MEAN PLASMA GLUCOSE: 123 mg/dL

## 2016-02-17 LAB — BASIC METABOLIC PANEL
BUN: 16 mg/dL (ref 7–25)
CO2: 25 mmol/L (ref 20–31)
CREATININE: 0.89 mg/dL (ref 0.50–1.05)
Calcium: 8.9 mg/dL (ref 8.6–10.4)
Chloride: 102 mmol/L (ref 98–110)
GLUCOSE: 84 mg/dL (ref 65–99)
Potassium: 3.8 mmol/L (ref 3.5–5.3)
SODIUM: 141 mmol/L (ref 135–146)

## 2016-02-17 LAB — HEPATITIS C ANTIBODY: HCV AB: NEGATIVE

## 2016-02-18 ENCOUNTER — Ambulatory Visit (INDEPENDENT_AMBULATORY_CARE_PROVIDER_SITE_OTHER): Payer: BLUE CROSS/BLUE SHIELD | Admitting: Family Medicine

## 2016-02-18 ENCOUNTER — Encounter: Payer: Self-pay | Admitting: Family Medicine

## 2016-02-18 VITALS — BP 150/100 | HR 86 | Resp 16 | Ht 61.0 in | Wt 214.0 lb

## 2016-02-18 DIAGNOSIS — E669 Obesity, unspecified: Secondary | ICD-10-CM

## 2016-02-18 DIAGNOSIS — E8881 Metabolic syndrome: Secondary | ICD-10-CM

## 2016-02-18 DIAGNOSIS — E559 Vitamin D deficiency, unspecified: Secondary | ICD-10-CM | POA: Diagnosis not present

## 2016-02-18 DIAGNOSIS — Z1211 Encounter for screening for malignant neoplasm of colon: Secondary | ICD-10-CM | POA: Diagnosis not present

## 2016-02-18 DIAGNOSIS — I10 Essential (primary) hypertension: Secondary | ICD-10-CM

## 2016-02-18 DIAGNOSIS — R7303 Prediabetes: Secondary | ICD-10-CM | POA: Diagnosis not present

## 2016-02-18 LAB — POC HEMOCCULT BLD/STL (OFFICE/1-CARD/DIAGNOSTIC): Fecal Occult Blood, POC: NEGATIVE

## 2016-02-18 MED ORDER — HYDROCHLOROTHIAZIDE 25 MG PO TABS: 25.0000 mg | ORAL_TABLET | Freq: Every day | ORAL | Status: DC

## 2016-02-18 MED ORDER — METFORMIN HCL 500 MG PO TABS: 500.0000 mg | ORAL_TABLET | Freq: Every day | ORAL | Status: DC

## 2016-02-18 NOTE — Patient Instructions (Addendum)
F/u in 4 month, call if you need me sooner  Fasting labs in 4 months  Increase in HCTZ, one of your BP meds as elevated today  New is metformin to help weight and blood sugar  "Clean eating"  Thank you  for choosing [facility]. We consider it a privelige to serve you.  Delivering excellent health care in a caring and  compassionate way is our goal.  Partnering with you,  so that together we can achieve this goal is our strategy. Rectal exam is normal

## 2016-02-18 NOTE — Progress Notes (Signed)
Subjective:    Patient ID: Megan Morales, female    DOB: [birth year], 61 y.o.   MRN: [id number]  HPI   [name]     MRN: [id number]      DOB: 06/15/1956   HPI [name] is here for follow up and re-evaluation of chronic medical conditions, medication management and review of any available recent lab and radiology data.  Preventive health is updated, specifically  Cancer screening and Immunization.   Questions or concerns regarding consultations or procedures which the PT has had in the interim are  addressed. The PT denies any adverse reactions to current medications since the last visit.  There are no new concerns.  There are no specific complaints   ROS Denies recent fever or chills. Denies sinus pressure, nasal congestion, ear pain or sore throat. Denies chest congestion, productive cough or wheezing. Denies chest pains, palpitations and leg swelling Denies abdominal pain, nausea, vomiting,diarrhea or constipation.   Denies dysuria, frequency, hesitancy or incontinence. Denies joint pain, swelling and limitation in mobility. Denies headaches, seizures, numbness, or tingling. Denies depression, anxiety or insomnia. Denies skin break down or rash.   PE  BP 150/100 mmHg  Pulse 86  Resp 16  Ht 5\' 1"  (1.549 m)  Wt 214 lb (97.07 kg)  BMI 40.46 kg/m2  SpO2 97%  Patient alert and oriented and in no cardiopulmonary distress.  HEENT: No facial asymmetry, EOMI,   oropharynx pink and moist.  Neck supple no JVD, no mass.  Chest: Clear to auscultation bilaterally.  CVS: S1, S2 no murmurs, no S3.Regular rate.  ABD: Soft non tender.  Rectal: No mass, heme negative stool  Ext: No edema  MS: Adequate ROM spine, shoulders, hips and knees.  Skin: Intact, no ulcerations or rash noted.  Psych: Good eye contact, normal affect. Memory intact not anxious or depressed appearing.  CNS: CN 2-12 intact, power,  normal throughout.no focal deficits noted.   Assessment &  Plan   ESSENTIAL HYPERTENSION, BENIGN Uncontrolled increase HCTZ dose DASH diet and commitment to daily physical activity for a minimum of 30 minutes discussed and encouraged, as a part of hypertension management. The importance of attaining a healthy weight is also discussed.  BP/Weight 02/18/2016 05/11/2015 01/08/2015 08/18/2014 08/04/2014 05/26/2014 123XX123  Systolic BP Q000111Q 123XX123 123456 A999333 - Q000111Q [id number]  Diastolic BP 123XX123 70 84 81 - 84 90  Wt. (Lbs) 214 207 213 201 201.6 197 199  BMI 40.46 39.13 40.27 38 38.11 37.24 37.62        Prediabetes Start metformin daily Updated lab needed at/ before next visit. [name] is reminded of the importance of commitment to daily physical activity for 30 minutes or more, as able and the need to limit carbohydrate intake to 30 to 60 grams per meal to help with blood sugar control.   The need to take medication as prescribed, test blood sugar as directed, and to call between visits if there is a concern that blood sugar is uncontrolled is also discussed.   [name] is reminded of the importance of daily foot exam, annual eye examination, and good blood sugar, blood pressure and cholesterol control.  Diabetic Labs Latest Ref Rng 02/16/2016 05/08/2015 01/06/2015 05/23/2014 01/20/2014  HbA1c <5.7 % 5.9(H) 5.8(H) 6.0(H) 5.8(H) 5.8(H)  Chol 125 - 200 mg/dL - 155 - 160 -  HDL >=46 mg/dL - 57 - 54 -  Calc LDL <130 mg/dL - 88 - 97 -  Triglycerides <150 mg/dL - 50 -  47 -  Creatinine 0.50 - 1.05 mg/dL 0.89 0.81 0.78 0.86 -   BP/Weight 02/18/2016 05/11/2015 01/08/2015 08/18/2014 08/04/2014 05/26/2014 123XX123  Systolic BP Q000111Q 123XX123 123456 A999333 - Q000111Q [id number]  Diastolic BP 123XX123 70 84 81 - 84 90  Wt. (Lbs) 214 207 213 201 201.6 197 199  BMI 40.46 39.13 40.27 38 38.11 37.24 37.62   No flowsheet data found.  Deteriorated     Vitamin D deficiency Updated lab needed at/ before next visit.   Obesity Deteriorated. Patient re-educated about  the importance of commitment to a   minimum of 150 minutes of exercise per week.  The importance of healthy food choices with portion control discussed. Encouraged to start a food diary, count calories and to consider  joining a support group. Sample diet sheets offered. Goals set by the patient for the next several months.   Weight /BMI 02/18/2016 05/11/2015 01/08/2015  WEIGHT 214 lb 207 lb 213 lb  HEIGHT 5\' 1"  5\' 1"  5\' 1"   BMI 40.46 kg/m2 39.13 kg/m2 40.27 kg/m2    Current exercise per week 60 minutes.   Metabolic syndrome The increased risk of cardiovascular disease associated with this diagnosis, and the need to consistently work on lifestyle to change this is discussed. Following  a  heart healthy diet ,commitment to 30 minutes of exercise at least 5 days per week, as well as control of blood sugar and cholesterol , and achieving a healthy weight are all the areas to be addressed .       Review of Systems     Objective:   Physical Exam        Assessment & Plan:

## 2016-02-21 NOTE — Assessment & Plan Note (Signed)
Uncontrolled increase HCTZ dose DASH diet and commitment to daily physical activity for a minimum of 30 minutes discussed and encouraged, as a part of hypertension management. The importance of attaining a healthy weight is also discussed.  BP/Weight 02/18/2016 05/11/2015 01/08/2015 08/18/2014 08/04/2014 05/26/2014 123XX123  Systolic BP Q000111Q 123XX123 123456 A999333 - Q000111Q [id number]  Diastolic BP 123XX123 70 84 81 - 84 90  Wt. (Lbs) 214 207 213 201 201.6 197 199  BMI 40.46 39.13 40.27 38 38.11 37.24 37.62

## 2016-02-21 NOTE — Assessment & Plan Note (Signed)
Deteriorated. Patient re-educated about  the importance of commitment to a  minimum of 150 minutes of exercise per week.  The importance of healthy food choices with portion control discussed. Encouraged to start a food diary, count calories and to consider  joining a support group. Sample diet sheets offered. Goals set by the patient for the next several months.   Weight /BMI 02/18/2016 05/11/2015 01/08/2015  WEIGHT 214 lb 207 lb 213 lb  HEIGHT 5\' 1"  5\' 1"  5\' 1"   BMI 40.46 kg/m2 39.13 kg/m2 40.27 kg/m2    Current exercise per week 60 minutes.

## 2016-02-21 NOTE — Assessment & Plan Note (Signed)
Start metformin daily Updated lab needed at/ before next visit. Megan Morales is reminded of the importance of commitment to daily physical activity for 30 minutes or more, as able and the need to limit carbohydrate intake to 30 to 60 grams per meal to help with blood sugar control.   The need to take medication as prescribed, test blood sugar as directed, and to call between visits if there is a concern that blood sugar is uncontrolled is also discussed.   Megan Morales is reminded of the importance of daily foot exam, annual eye examination, and good blood sugar, blood pressure and cholesterol control.  Diabetic Labs Latest Ref Rng 02/16/2016 05/08/2015 01/06/2015 05/23/2014 01/20/2014  HbA1c <5.7 % 5.9(H) 5.8(H) 6.0(H) 5.8(H) 5.8(H)  Chol 125 - 200 mg/dL - 155 - 160 -  HDL >=46 mg/dL - 57 - 54 -  Calc LDL <130 mg/dL - 88 - 97 -  Triglycerides <150 mg/dL - 50 - 47 -  Creatinine 0.50 - 1.05 mg/dL 0.89 0.81 0.78 0.86 -   BP/Weight 02/18/2016 05/11/2015 01/08/2015 08/18/2014 08/04/2014 05/26/2014 123XX123  Systolic BP Q000111Q 123XX123 123456 A999333 - Q000111Q [id number]  Diastolic BP 123XX123 70 84 81 - 84 90  Wt. (Lbs) 214 207 213 201 201.6 197 199  BMI 40.46 39.13 40.27 38 38.11 37.24 37.62   No flowsheet data found.  Deteriorated

## 2016-02-21 NOTE — Assessment & Plan Note (Signed)
The increased risk of cardiovascular disease associated with this diagnosis, and the need to consistently work on lifestyle to change this is discussed. Following  a  heart healthy diet ,commitment to 30 minutes of exercise at least 5 days per week, as well as control of blood sugar and cholesterol , and achieving a healthy weight are all the areas to be addressed .  

## 2016-02-21 NOTE — Assessment & Plan Note (Signed)
Updated lab needed at/ before next visit.   

## 2016-03-08 ENCOUNTER — Other Ambulatory Visit: Payer: Self-pay | Admitting: Family Medicine

## 2016-03-08 DIAGNOSIS — Z1231 Encounter for screening mammogram for malignant neoplasm of breast: Secondary | ICD-10-CM

## 2016-03-28 ENCOUNTER — Ambulatory Visit: Payer: BLUE CROSS/BLUE SHIELD

## 2016-05-15 ENCOUNTER — Other Ambulatory Visit: Payer: Self-pay | Admitting: Family Medicine

## 2016-06-02 ENCOUNTER — Ambulatory Visit: Payer: BLUE CROSS/BLUE SHIELD

## 2016-06-16 ENCOUNTER — Ambulatory Visit
Admission: RE | Admit: 2016-06-16 | Discharge: 2016-06-16 | Disposition: A | Discharge status: Home / Self Care | Payer: BLUE CROSS/BLUE SHIELD | Source: Ambulatory Visit | Attending: Family Medicine | Admitting: Family Medicine

## 2016-06-16 DIAGNOSIS — Z1231 Encounter for screening mammogram for malignant neoplasm of breast: Secondary | ICD-10-CM | POA: Diagnosis not present

## 2016-06-29 ENCOUNTER — Ambulatory Visit: Payer: BLUE CROSS/BLUE SHIELD | Admitting: Family Medicine

## 2016-07-13 DIAGNOSIS — I10 Essential (primary) hypertension: Secondary | ICD-10-CM | POA: Diagnosis not present

## 2016-07-13 DIAGNOSIS — R7303 Prediabetes: Secondary | ICD-10-CM | POA: Diagnosis not present

## 2016-07-13 DIAGNOSIS — E559 Vitamin D deficiency, unspecified: Secondary | ICD-10-CM | POA: Diagnosis not present

## 2016-07-13 DIAGNOSIS — R7309 Other abnormal glucose: Secondary | ICD-10-CM | POA: Diagnosis not present

## 2016-07-13 LAB — COMPREHENSIVE METABOLIC PANEL
ALK PHOS: 95 U/L (ref 33–130)
ALT: 14 U/L (ref 6–29)
AST: 14 U/L (ref 10–35)
Albumin: 4 g/dL (ref 3.6–5.1)
BILIRUBIN TOTAL: 0.4 mg/dL (ref 0.2–1.2)
BUN: 20 mg/dL (ref 7–25)
CO2: 27 mmol/L (ref 20–31)
CREATININE: 0.89 mg/dL (ref 0.50–1.05)
Calcium: 9.2 mg/dL (ref 8.6–10.4)
Chloride: 105 mmol/L (ref 98–110)
Glucose, Bld: 82 mg/dL (ref 65–99)
Potassium: 4 mmol/L (ref 3.5–5.3)
SODIUM: 140 mmol/L (ref 135–146)
TOTAL PROTEIN: 7.5 g/dL (ref 6.1–8.1)

## 2016-07-13 LAB — CBC
HCT: 38.3 % (ref 35.0–45.0)
HEMOGLOBIN: 12.8 g/dL (ref 11.7–15.5)
MCH: 30.3 pg (ref 27.0–33.0)
MCHC: 33.4 g/dL (ref 32.0–36.0)
MCV: 90.5 fL (ref 80.0–100.0)
MPV: 11.1 fL (ref 7.5–12.5)
PLATELETS: 213 10*3/uL (ref 140–400)
RBC: 4.23 MIL/uL (ref 3.80–5.10)
RDW: 14 % (ref 11.0–15.0)
WBC: 4.6 10*3/uL (ref 3.8–10.8)

## 2016-07-13 LAB — LIPID PANEL
CHOL/HDL RATIO: 3.1 ratio (ref <=5.0)
CHOLESTEROL: 173 mg/dL (ref 125–200)
HDL: 56 mg/dL (ref >=46)
LDL CALC: 106 mg/dL (ref <130)
Triglycerides: 53 mg/dL (ref <150)
VLDL: 11 mg/dL (ref <30)

## 2016-07-13 LAB — TSH: TSH: 1.93 m[IU]/L

## 2016-07-13 LAB — HEMOGLOBIN A1C
HEMOGLOBIN A1C: 5.5 % (ref <5.7)
MEAN PLASMA GLUCOSE: 111 mg/dL

## 2016-07-14 LAB — VITAMIN D 25 HYDROXY (VIT D DEFICIENCY, FRACTURES): VIT D 25 HYDROXY: 20 ng/mL — AB (ref 30–100)

## 2016-07-18 ENCOUNTER — Encounter: Payer: Self-pay | Admitting: Family Medicine

## 2016-07-18 ENCOUNTER — Ambulatory Visit (INDEPENDENT_AMBULATORY_CARE_PROVIDER_SITE_OTHER): Payer: BLUE CROSS/BLUE SHIELD | Admitting: Family Medicine

## 2016-07-18 VITALS — BP 118/80 | HR 89 | Ht 61.0 in | Wt 213.1 lb

## 2016-07-18 DIAGNOSIS — Z23 Encounter for immunization: Secondary | ICD-10-CM | POA: Diagnosis not present

## 2016-07-18 DIAGNOSIS — E559 Vitamin D deficiency, unspecified: Secondary | ICD-10-CM

## 2016-07-18 DIAGNOSIS — Z114 Encounter for screening for human immunodeficiency virus [HIV]: Secondary | ICD-10-CM | POA: Diagnosis not present

## 2016-07-18 DIAGNOSIS — E8881 Metabolic syndrome: Secondary | ICD-10-CM

## 2016-07-18 DIAGNOSIS — R7303 Prediabetes: Secondary | ICD-10-CM | POA: Diagnosis not present

## 2016-07-18 DIAGNOSIS — I10 Essential (primary) hypertension: Secondary | ICD-10-CM

## 2016-07-18 NOTE — Assessment & Plan Note (Signed)
Controlled, no change in medication DASH diet and commitment to daily physical activity for a minimum of 30 minutes discussed and encouraged, as a part of hypertension management. The importance of attaining a healthy weight is also discussed.  BP/Weight 07/18/2016 02/18/2016 05/11/2015 01/08/2015 08/18/2014 08/04/2014 [id number]  Systolic BP 123456 Q000111Q 123XX123 123456 A999333 - Q000111Q  Diastolic BP 80 123XX123 70 84 81 - 84  Wt. (Lbs) 213.12 214 207 213 201 201.6 197  BMI 40.27 40.46 39.13 40.27 38 38.11 37.24

## 2016-07-18 NOTE — Progress Notes (Signed)
Megan Morales     MRN: [id number]      DOB: 1956/06/03   HPI Ms. Terranova is here for follow up and re-evaluation of chronic medical conditions, medication management and review of any available recent lab and radiology data.  Preventive health is updated, specifically  Cancer screening and Immunization.   Questions or concerns regarding consultations or procedures which the PT has had in the interim are  addressed. The PT denies any adverse reactions to current medications since the last visit.  There are no new concerns.  Still no significant change in lifestyle and no weight loss   ROS Denies recent fever or chills. Denies sinus pressure, nasal congestion, ear pain or sore throat. Denies chest congestion, productive cough or wheezing. Denies chest pains, palpitations and leg swelling Denies abdominal pain, nausea, vomiting,diarrhea or constipation.   Denies dysuria, frequency, hesitancy or incontinence. Denies joint pain, swelling and limitation in mobility. Denies headaches, seizures, numbness, or tingling. Denies depression, anxiety or insomnia. Denies skin break down or rash.   PE  BP 118/80   Pulse 89   Ht 5\' 1"  (1.549 m)   Wt 213 lb 1.9 oz (96.7 kg)   SpO2 98%   BMI 40.27 kg/m   Patient alert and oriented and in no cardiopulmonary distress.  HEENT: No facial asymmetry, EOMI,   oropharynx pink and moist.  Neck supple no JVD, no mass.  Chest: Clear to auscultation bilaterally.  CVS: S1, S2 no murmurs, no S3.Regular rate.  ABD: Soft non tender.   Ext: No edema  MS: Adequate ROM spine, shoulders, hips and knees.  Skin: Intact, no ulcerations or rash noted.  Psych: Good eye contact, normal affect. Memory intact not anxious or depressed appearing.  CNS: CN 2-12 intact, power,  normal throughout.no focal deficits noted.   Assessment & Plan  ESSENTIAL HYPERTENSION, BENIGN Controlled, no change in medication DASH diet and commitment to daily physical  activity for a minimum of 30 minutes discussed and encouraged, as a part of hypertension management. The importance of attaining a healthy weight is also discussed.  BP/Weight 07/18/2016 02/18/2016 05/11/2015 01/08/2015 08/18/2014 08/04/2014 [id number]  Systolic BP 123456 Q000111Q 123XX123 123456 [id number] - Q000111Q  Diastolic BP 80 123XX123 70 84 81 - 84  Wt. (Lbs) 213.12 214 207 213 201 201.6 197  BMI 40.27 40.46 39.13 40.27 38 38.11 37.24       Prediabetes Improved Patient educated about the importance of limiting  Carbohydrate intake , the need to commit to daily physical activity for a minimum of 30 minutes , and to commit weight loss. The fact that changes in all these areas will reduce or eliminate all together the development of diabetes is stressed.   Diabetic Labs Latest Ref Rng & Units 07/13/2016 02/16/2016 05/08/2015 01/06/2015 05/23/2014  HbA1c <5.7 % 5.5 5.9(H) 5.8(H) 6.0(H) 5.8(H)  Chol 125 - 200 mg/dL 173 - 155 - 160  HDL >=46 mg/dL 56 - 57 - 54  Calc LDL <130 mg/dL 106 - 88 - 97  Triglycerides <150 mg/dL 53 - 50 - 47  Creatinine 0.50 - 1.05 mg/dL 0.89 0.89 0.81 0.78 0.86   BP/Weight 07/18/2016 02/18/2016 05/11/2015 01/08/2015 08/18/2014 08/04/2014 [id number]  Systolic BP 123456 Q000111Q 123XX123 123456 [id number] - Q000111Q  Diastolic BP 80 123XX123 70 84 81 - 84  Wt. (Lbs) 213.12 214 207 213 201 201.6 197  BMI 40.27 40.46 39.13 40.27 38 38.11 37.24   No flowsheet data found.    Vitamin D deficiency  Uncorrected, needs to commit to daily vit D3

## 2016-07-18 NOTE — Patient Instructions (Signed)
F/u in 5 months, call if you need me before  Flu vaccine today  STOP METFORMIN  nEW is OTC vit D3 1000 IU once daily  Please work on good  health habits so that your health will improve. 1. Commitment to daily physical activity for 30 to 60  minutes, if you are able to do this.  2. Commitment to wise food choices. Aim for half of your  food intake to be vegetable and fruit, one quarter starchy foods, and one quarter protein. Try to eat on a regular schedule  3 meals per day, snacking between meals should be limited to vegetables or fruits or small portions of nuts. 64 ounces of water per day is generally recommended, unless you have specific health conditions, like heart failure or kidney failure where you will need to limit fluid intake.  3. Commitment to sufficient and a  good quality of physical and mental rest daily, generally between 6 to 8 hours per day.  WITH PERSISTANCE AND PERSEVERANCE, THE IMPOSSIBLE , BECOMES THE NORM! It is important that you exercise regularly at least 30 minutes 5 times a week. If you develop chest pain, have severe difficulty breathing, or feel very tired, stop exercising immediately and seek medical attention    Non Fasting chem 7 and EGFR, HBa1c, Vit D in 5 month

## 2016-07-18 NOTE — Assessment & Plan Note (Signed)
Uncorrected, needs to commit to daily vit D3

## 2016-07-18 NOTE — Assessment & Plan Note (Signed)
Improved Patient educated about the importance of limiting  Carbohydrate intake , the need to commit to daily physical activity for a minimum of 30 minutes , and to commit weight loss. The fact that changes in all these areas will reduce or eliminate all together the development of diabetes is stressed.   Diabetic Labs Latest Ref Rng & Units 07/13/2016 02/16/2016 05/08/2015 01/06/2015 05/23/2014  HbA1c <5.7 % 5.5 5.9(H) 5.8(H) 6.0(H) 5.8(H)  Chol 125 - 200 mg/dL 173 - 155 - 160  HDL >=46 mg/dL 56 - 57 - 54  Calc LDL <130 mg/dL 106 - 88 - 97  Triglycerides <150 mg/dL 53 - 50 - 47  Creatinine 0.50 - 1.05 mg/dL 0.89 0.89 0.81 0.78 0.86   BP/Weight 07/18/2016 02/18/2016 05/11/2015 01/08/2015 08/18/2014 08/04/2014 [id number]  Systolic BP 123456 Q000111Q 123XX123 123456 [id number] - Q000111Q  Diastolic BP 80 123XX123 70 84 81 - 84  Wt. (Lbs) 213.12 214 207 213 201 201.6 197  BMI 40.27 40.46 39.13 40.27 38 38.11 37.24   No flowsheet data found.

## 2016-12-15 ENCOUNTER — Ambulatory Visit: Payer: BLUE CROSS/BLUE SHIELD | Admitting: Family Medicine

## 2017-01-12 ENCOUNTER — Other Ambulatory Visit: Payer: Self-pay | Admitting: Family Medicine

## 2017-03-03 DIAGNOSIS — H04123 Dry eye syndrome of bilateral lacrimal glands: Secondary | ICD-10-CM | POA: Diagnosis not present

## 2017-03-03 DIAGNOSIS — H40033 Anatomical narrow angle, bilateral: Secondary | ICD-10-CM | POA: Diagnosis not present

## 2017-07-18 ENCOUNTER — Other Ambulatory Visit: Payer: Self-pay | Admitting: Family Medicine

## 2017-07-18 DIAGNOSIS — Z1231 Encounter for screening mammogram for malignant neoplasm of breast: Secondary | ICD-10-CM

## 2017-08-08 ENCOUNTER — Ambulatory Visit
Admission: RE | Admit: 2017-08-08 | Discharge: 2017-08-08 | Disposition: A | Discharge status: Home / Self Care | Payer: BLUE CROSS/BLUE SHIELD | Source: Ambulatory Visit | Attending: Family Medicine | Admitting: Family Medicine

## 2017-08-08 DIAGNOSIS — Z1231 Encounter for screening mammogram for malignant neoplasm of breast: Secondary | ICD-10-CM | POA: Diagnosis not present

## 2017-08-10 ENCOUNTER — Telehealth: Payer: Self-pay | Admitting: *Deleted

## 2017-08-10 DIAGNOSIS — I10 Essential (primary) hypertension: Secondary | ICD-10-CM

## 2017-08-10 DIAGNOSIS — R7303 Prediabetes: Secondary | ICD-10-CM

## 2017-08-10 DIAGNOSIS — E559 Vitamin D deficiency, unspecified: Secondary | ICD-10-CM

## 2017-08-10 DIAGNOSIS — E8881 Metabolic syndrome: Secondary | ICD-10-CM

## 2017-08-10 DIAGNOSIS — Z1159 Encounter for screening for other viral diseases: Secondary | ICD-10-CM

## 2017-08-10 NOTE — Telephone Encounter (Signed)
Patient called stating she has someone coming to get her lab order . Please print order for patient

## 2017-08-11 NOTE — Telephone Encounter (Signed)
Labs printed and up front for collection

## 2017-09-07 DIAGNOSIS — Z1159 Encounter for screening for other viral diseases: Secondary | ICD-10-CM | POA: Diagnosis not present

## 2017-09-07 DIAGNOSIS — I10 Essential (primary) hypertension: Secondary | ICD-10-CM | POA: Diagnosis not present

## 2017-09-07 DIAGNOSIS — E559 Vitamin D deficiency, unspecified: Secondary | ICD-10-CM | POA: Diagnosis not present

## 2017-09-07 DIAGNOSIS — E8881 Metabolic syndrome: Secondary | ICD-10-CM | POA: Diagnosis not present

## 2017-09-08 LAB — BASIC METABOLIC PANEL WITH GFR
BUN: 11 mg/dL (ref 7–25)
CALCIUM: 9.1 mg/dL (ref 8.6–10.4)
CHLORIDE: 105 mmol/L (ref 98–110)
CO2: 30 mmol/L (ref 20–32)
Creat: 0.83 mg/dL (ref 0.50–0.99)
GFR, Est African American: 89 mL/min/{1.73_m2} (ref >=60)
GFR, Est Non African American: 77 mL/min/{1.73_m2} (ref >=60)
Glucose, Bld: 88 mg/dL (ref 65–99)
Potassium: 3.7 mmol/L (ref 3.5–5.3)
Sodium: 141 mmol/L (ref 135–146)

## 2017-09-08 LAB — HEMOGLOBIN A1C
Hgb A1c MFr Bld: 5.8 % of total Hgb — ABNORMAL HIGH (ref <5.7)
Mean Plasma Glucose: 120 (calc)
eAG (mmol/L): 6.6 (calc)

## 2017-09-08 LAB — VITAMIN D 25 HYDROXY (VIT D DEFICIENCY, FRACTURES): VIT D 25 HYDROXY: 15 ng/mL — AB (ref 30–100)

## 2017-09-08 LAB — HIV ANTIBODY (ROUTINE TESTING W REFLEX): HIV 1&2 Ab, 4th Generation: NONREACTIVE

## 2017-09-14 ENCOUNTER — Ambulatory Visit (INDEPENDENT_AMBULATORY_CARE_PROVIDER_SITE_OTHER): Payer: BLUE CROSS/BLUE SHIELD | Admitting: Family Medicine

## 2017-09-14 ENCOUNTER — Other Ambulatory Visit (HOSPITAL_COMMUNITY)
Admission: RE | Admit: 2017-09-14 | Discharge: 2017-09-14 | Disposition: A | Discharge status: Home / Self Care | Payer: BLUE CROSS/BLUE SHIELD | Source: Ambulatory Visit | Attending: Family Medicine | Admitting: Family Medicine

## 2017-09-14 ENCOUNTER — Encounter: Payer: Self-pay | Admitting: Family Medicine

## 2017-09-14 VITALS — BP 130/84 | HR 80 | Resp 16 | Ht 61.0 in | Wt 226.0 lb

## 2017-09-14 DIAGNOSIS — B356 Tinea cruris: Secondary | ICD-10-CM | POA: Diagnosis not present

## 2017-09-14 DIAGNOSIS — Z23 Encounter for immunization: Secondary | ICD-10-CM

## 2017-09-14 DIAGNOSIS — Z124 Encounter for screening for malignant neoplasm of cervix: Secondary | ICD-10-CM | POA: Insufficient documentation

## 2017-09-14 DIAGNOSIS — I10 Essential (primary) hypertension: Secondary | ICD-10-CM | POA: Diagnosis not present

## 2017-09-14 DIAGNOSIS — E559 Vitamin D deficiency, unspecified: Secondary | ICD-10-CM | POA: Diagnosis not present

## 2017-09-14 DIAGNOSIS — Z1211 Encounter for screening for malignant neoplasm of colon: Secondary | ICD-10-CM | POA: Diagnosis not present

## 2017-09-14 DIAGNOSIS — R7303 Prediabetes: Secondary | ICD-10-CM | POA: Diagnosis not present

## 2017-09-14 DIAGNOSIS — Z0000 Encounter for general adult medical examination without abnormal findings: Secondary | ICD-10-CM | POA: Diagnosis not present

## 2017-09-14 DIAGNOSIS — Z01419 Encounter for gynecological examination (general) (routine) without abnormal findings: Secondary | ICD-10-CM | POA: Diagnosis not present

## 2017-09-14 LAB — POC HEMOCCULT BLD/STL (OFFICE/1-CARD/DIAGNOSTIC): Fecal Occult Blood, POC: NEGATIVE

## 2017-09-14 MED ORDER — TERBINAFINE HCL 250 MG PO TABS: 250.0000 mg | ORAL_TABLET | Freq: Every day | ORAL | 0 refills | Status: DC

## 2017-09-14 MED ORDER — CLOTRIMAZOLE 1 % EX CREA: 1.0000 "application " | TOPICAL_CREAM | Freq: Two times a day (BID) | CUTANEOUS | 1 refills | Status: DC

## 2017-09-14 NOTE — Patient Instructions (Signed)
F/u in 6 months, call if you need me sooner  Please check with insurance company with coverage for shingrix vaccine, you need this  Flu vaccine today  Start once weekly vitamin  D this is prescribed for 1 year  Cut back on starchy foods and increase vegetable and water  1 week of tablets and antifungal cream are prescribed for rash  Pap is sent   Fasting lipid, hBA1C, cBC, tSH, chem 7 in 5 months and 3 weeks  It is important that you exercise regularly at least 30 minutes 5 times a week. If you develop chest pain, have severe difficulty breathing, or feel very tired, stop exercising immediately and seek medical attention    Please work on good  health habits so that your health will improve. 1. Commitment to daily physical activity for 30 to 60  minutes, if you are able to do this.  2. Commitment to wise food choices. Aim for half of your  food intake to be vegetable and fruit, one quarter starchy foods, and one quarter protein. Try to eat on a regular schedule  3 meals per day, snacking between meals should be limited to vegetables or fruits or small portions of nuts. 64 ounces of water per day is generally recommended, unless you have specific health conditions, like heart failure or kidney failure where you will need to limit fluid intake.  3. Commitment to sufficient and a  good quality of physical and mental rest daily, generally between 6 to 8 hours per day.  WITH PERSISTANCE AND PERSEVERANCE, THE IMPOSSIBLE , BECOMES THE NORM!

## 2017-09-14 NOTE — Progress Notes (Signed)
Megan Morales     MRN: [id number]      DOB: [birth year]  HPI: Patient is in for annual physical exam. Grieving the loss of her closest brother 3 months ago, and spent about 10 minutes of the visit talking about this, and allowing herself to express her grief and emotions and to cry C/o itchy rash in groin and between  Skin folds for past 1 month, shew has tried to self medicate with no success Recent labs, are reviewed. Immunization is reviewed , and  updated    PE: BP 130/84   Pulse 80   Resp 16   Ht 5\' 1"  (1.549 m)   Wt 226 lb (102.5 kg)   SpO2 98%   BMI 42.70 kg/m   Pleasant  female, alert and oriented x 3, in no cardio-pulmonary distress. Afebrile. HEENT No facial trauma or asymetry. Sinuses non tender.  Extra occullar muscles intact, pupils equally reactive to light. External ears normal, tympanic membranes clear. Oropharynx moist, no exudate. Neck: supple, no adenopathy,JVD or thyromegaly.No bruits.  Chest: Clear to ascultation bilaterally.No crackles or wheezes. Non tender to palpation  Breast: No asymetry,no masses or lumps. No tenderness. No nipple discharge or inversion. No axillary or supraclavicular adenopathy  Cardiovascular system; Heart sounds normal,  S1 and  S2 ,no S3.  No murmur, or thrill. Apical beat not displaced Peripheral pulses normal.  Abdomen: Soft, non tender, no organomegaly or masses. No bruits. Bowel sounds normal. No guarding, tenderness or rebound.  Rectal:  Normal sphincter tone. No rectal mass. Guaiac negative stool.  GU: External genitalia normal female genitalia , normal female distribution of hair. No lesions. Urethral meatus normal in size, no  Prolapse, no lesions visibly  Present. Bladder non tender. Vagina pink and moist , with no visible lesions ,white  discharge present . Adequate pelvic support no  cystocele or rectocele noted Cervix pink and appears healthy, no lesions or ulcerations noted, thick white   discharge noted from os Uterus enlarged , no adnexal masses, no cervical motion or adnexal tenderness.   Musculoskeletal exam: Full ROM of spine, hips , shoulders and knees. No deformity ,swelling or crepitus noted. No muscle wasting or atrophy.   Neurologic: Cranial nerves 2 to 12 intact. Power, tone ,sensation and reflexes normal throughout. No disturbance in gait. No tremor.  Skin: Intact, tinea cruris Pigmentation normal throughout  Psych; Normal mood and affect. Judgement and concentration normal   Assessment & Plan:  Annual physical exam Annual exam as documented. Counseling done  re healthy lifestyle involving commitment to 150 minutes exercise per week, heart healthy diet, and attaining healthy weight.The importance of adequate sleep also discussed. Regular seat belt use and home safety, is also discussed. Changes in health habits are decided on by the patient with goals and time frames  set for achieving them. Immunization and cancer screening needs are specifically addressed at this visit.   Tinea cruris Terbinafine  tablets prescribed and clotrimazole cream   Vitamin D deficiency Once weekly vitamin D prescribed for the next 12 months  ESSENTIAL HYPERTENSION, BENIGN Controlled, no change in medication DASH diet and commitment to daily physical activity for a minimum of 30 minutes discussed and encouraged, as a part of hypertension management. The importance of attaining a healthy weight is also discussed.  BP/Weight 09/14/2017 07/18/2016 02/18/2016 05/11/2015 01/08/2015 08/18/2014 24/11/3534  Systolic BP 144 315 400 867 619 509 -  Diastolic BP 84 80 326 70 84 81 -  Wt. (Lbs) 226  213.12 214 207 213 201 201.6  BMI 42.7 40.27 40.46 39.13 40.27 38 38.11       Obesity Deteriorated. Patient re-educated about  the importance of commitment to a  minimum of 150 minutes of exercise per week.  The importance of healthy food choices with portion control  discussed. Encouraged to start a food diary, count calories and to consider  joining a support group. Sample diet sheets offered. Goals set by the patient for the next several months.   Weight /BMI 09/14/2017 07/18/2016 02/18/2016  WEIGHT 226 lb 213 lb 1.9 oz 214 lb  HEIGHT 5\' 1"  5\' 1"  5\' 1"   BMI 42.7 kg/m2 40.27 kg/m2 40.46 kg/m2      Prediabetes Deteriorated Patient educated about the importance of limiting  Carbohydrate intake , the need to commit to daily physical activity for a minimum of 30 minutes , and to commit weight loss. The fact that changes in all these areas will reduce or eliminate all together the development of diabetes is stressed.   Diabetic Labs Latest Ref Rng & Units 09/07/2017 07/13/2016 02/16/2016 05/08/2015 01/06/2015  HbA1c <5.7 % of total Hgb 5.8(H) 5.5 5.9(H) 5.8(H) 6.0(H)  Chol 125 - 200 mg/dL - 173 - 155 -  HDL >=46 mg/dL - 56 - 57 -  Calc LDL <130 mg/dL - 106 - 88 -  Triglycerides <150 mg/dL - 53 - 50 -  Creatinine 0.50 - 0.99 mg/dL 0.83 0.89 0.89 0.81 0.78   BP/Weight 09/14/2017 07/18/2016 02/18/2016 05/11/2015 01/08/2015 08/18/2014 16/10/958  Systolic BP 454 098 119 147 829 562 -  Diastolic BP 84 80 130 70 84 81 -  Wt. (Lbs) 226 213.12 214 207 213 201 201.6  BMI 42.7 40.27 40.46 39.13 40.27 38 38.11   No flowsheet data found.  Updated lab needed at next visit.

## 2017-09-16 ENCOUNTER — Encounter: Payer: Self-pay | Admitting: Family Medicine

## 2017-09-16 DIAGNOSIS — B356 Tinea cruris: Secondary | ICD-10-CM | POA: Insufficient documentation

## 2017-09-16 NOTE — Assessment & Plan Note (Signed)
Terbinafine  tablets prescribed and clotrimazole cream

## 2017-09-16 NOTE — Assessment & Plan Note (Signed)
Controlled, no change in medication DASH diet and commitment to daily physical activity for a minimum of 30 minutes discussed and encouraged, as a part of hypertension management. The importance of attaining a healthy weight is also discussed.  BP/Weight 09/14/2017 07/18/2016 02/18/2016 05/11/2015 01/08/2015 08/18/2014 41/11/8206  Systolic BP 138 871 959 747 185 501 -  Diastolic BP 84 80 586 70 84 81 -  Wt. (Lbs) 226 213.12 214 207 213 201 201.6  BMI 42.7 40.27 40.46 39.13 40.27 38 38.11

## 2017-09-16 NOTE — Assessment & Plan Note (Signed)
Once weekly vitamin D prescribed for the next 12 months

## 2017-09-16 NOTE — Assessment & Plan Note (Signed)
Deteriorated. Patient re-educated about  the importance of commitment to a  minimum of 150 minutes of exercise per week.  The importance of healthy food choices with portion control discussed. Encouraged to start a food diary, count calories and to consider  joining a support group. Sample diet sheets offered. Goals set by the patient for the next several months.   Weight /BMI 09/14/2017 07/18/2016 02/18/2016  WEIGHT 226 lb 213 lb 1.9 oz 214 lb  HEIGHT 5\' 1"  5\' 1"  5\' 1"   BMI 42.7 kg/m2 40.27 kg/m2 40.46 kg/m2

## 2017-09-16 NOTE — Assessment & Plan Note (Signed)

## 2017-09-16 NOTE — Assessment & Plan Note (Signed)
Deteriorated Patient educated about the importance of limiting  Carbohydrate intake , the need to commit to daily physical activity for a minimum of 30 minutes , and to commit weight loss. The fact that changes in all these areas will reduce or eliminate all together the development of diabetes is stressed.   Diabetic Labs Latest Ref Rng & Units 09/07/2017 07/13/2016 02/16/2016 05/08/2015 01/06/2015  HbA1c <5.7 % of total Hgb 5.8(H) 5.5 5.9(H) 5.8(H) 6.0(H)  Chol 125 - 200 mg/dL - 173 - 155 -  HDL >=46 mg/dL - 56 - 57 -  Calc LDL <130 mg/dL - 106 - 88 -  Triglycerides <150 mg/dL - 53 - 50 -  Creatinine 0.50 - 0.99 mg/dL 0.83 0.89 0.89 0.81 0.78   BP/Weight 09/14/2017 07/18/2016 02/18/2016 05/11/2015 01/08/2015 08/18/2014 32/06/9241  Systolic BP 683 419 622 297 989 211 -  Diastolic BP 84 80 941 70 84 81 -  Wt. (Lbs) 226 213.12 214 207 213 201 201.6  BMI 42.7 40.27 40.46 39.13 40.27 38 38.11   No flowsheet data found.  Updated lab needed at next visit.

## 2017-09-20 ENCOUNTER — Other Ambulatory Visit: Payer: Self-pay | Admitting: Family Medicine

## 2017-09-20 ENCOUNTER — Encounter: Payer: Self-pay | Admitting: Family Medicine

## 2017-09-20 LAB — CYTOLOGY - PAP
BACTERIAL VAGINITIS: POSITIVE — AB
CHLAMYDIA, DNA PROBE: NEGATIVE
Candida vaginitis: POSITIVE — AB
Diagnosis: UNDETERMINED — AB
HPV: NOT DETECTED
NEISSERIA GONORRHEA: NEGATIVE
Trichomonas: NEGATIVE

## 2017-09-22 ENCOUNTER — Other Ambulatory Visit: Payer: Self-pay

## 2017-09-22 MED ORDER — METRONIDAZOLE 500 MG PO TABS: 500.0000 mg | ORAL_TABLET | Freq: Two times a day (BID) | ORAL | 0 refills | Status: DC

## 2017-09-22 MED ORDER — FLUCONAZOLE 150 MG PO TABS: ORAL_TABLET | ORAL | 0 refills | Status: DC

## 2017-11-16 DIAGNOSIS — Z6841 Body Mass Index (BMI) 40.0 and over, adult: Secondary | ICD-10-CM | POA: Diagnosis not present

## 2017-11-16 DIAGNOSIS — M7652 Patellar tendinitis, left knee: Secondary | ICD-10-CM | POA: Diagnosis not present

## 2017-11-21 ENCOUNTER — Other Ambulatory Visit: Payer: Self-pay

## 2017-11-21 DIAGNOSIS — I10 Essential (primary) hypertension: Secondary | ICD-10-CM

## 2017-11-30 DIAGNOSIS — H10013 Acute follicular conjunctivitis, bilateral: Secondary | ICD-10-CM | POA: Diagnosis not present

## 2018-01-01 ENCOUNTER — Other Ambulatory Visit: Payer: Self-pay | Admitting: Family Medicine

## 2018-01-01 ENCOUNTER — Telehealth: Payer: Self-pay | Admitting: Family Medicine

## 2018-01-01 ENCOUNTER — Encounter: Payer: Self-pay | Admitting: Family Medicine

## 2018-01-01 ENCOUNTER — Ambulatory Visit: Payer: BLUE CROSS/BLUE SHIELD | Admitting: Family Medicine

## 2018-01-01 VITALS — BP 138/88 | HR 83 | Resp 16 | Ht 61.0 in | Wt 228.0 lb

## 2018-01-01 DIAGNOSIS — N6452 Nipple discharge: Secondary | ICD-10-CM

## 2018-01-01 DIAGNOSIS — M1712 Unilateral primary osteoarthritis, left knee: Secondary | ICD-10-CM

## 2018-01-01 DIAGNOSIS — I10 Essential (primary) hypertension: Secondary | ICD-10-CM

## 2018-01-01 MED ORDER — PREDNISONE 5 MG PO TABS: ORAL_TABLET | ORAL | 0 refills | Status: DC

## 2018-01-01 NOTE — Telephone Encounter (Signed)
Do you want to add on?

## 2018-01-01 NOTE — Telephone Encounter (Signed)
Right breast had bleeding (seen blood in her bra) she is requesting an appt with Dr.Simpson today, she is not comfortable seeing another Dr. Cb#: [phone]

## 2018-01-01 NOTE — Telephone Encounter (Signed)
Work in toiday with me we have no shows please!

## 2018-01-01 NOTE — Patient Instructions (Signed)
F/U as before, call if you need me sooner  Please check with nurse re appt for your mammogram at breast center, bloody nipple discharge is abnormal and the cause  needs to be diagnosed  Prednisone at low dose for 5 days is prescribed, fill ansd use only for repeat flare  Please work on weight loss with eating green and clean and only drinking water  It is important that you exercise regularly at least 30 minutes 5 times a week. If you develop chest pain, have severe difficulty breathing, or feel very tired, stop exercising immediately and seek medical attention    Thank you  for choosing [facility]. We consider it a privelige to serve you.  Delivering excellent health care in a caring and  compassionate way is our goal.  Partnering with you,  so that together we can achieve this goal is our strategy.

## 2018-01-01 NOTE — Telephone Encounter (Signed)
appt made

## 2018-01-01 NOTE — Progress Notes (Signed)
   Megan Morales     MRN: [id number]      DOB: Oct 14, 1955   HPI Megan Morales is here wtth a 3 day h/o painless bright red nipple discharge from right breast at 7 o clock position, no known trauma, no pain, no prior episode. Normal mammogram in 08/2017 Mother had breast cancer a age 61 No mass Left knee pain ,crepitus and was treated with prednisone, pain improved to 4 was over 10, main complaint swelling and popping  ROS Denies recent fever or chills. Denies sinus pressure, nasal congestion, ear pain or sore throat. Denies chest congestion, productive cough or wheezing. Denies chest pains, palpitations and leg swelling Denies abdominal pain, nausea, vomiting,diarrhea or constipation.   Denies dysuria, frequency, hesitancy or incontinence. Denies headaches, seizures, numbness, or tingling. Denies depression, anxiety or insomnia. Denies skin break down or rash.   PE  BP 138/88   Pulse 83   Resp 16   Ht 5\' 1"  (1.549 m)   Wt 228 lb (103.4 kg)   SpO2 97%   BMI 43.08 kg/m   Patient alert and oriented and in no cardiopulmonary distress.  HEENT: No facial asymmetry, EOMI,   oropharynx pink and moist.  Neck supple no JVD, no mass.  Chest: Clear to auscultation bilaterally. Breast: Left No mass, discharge  Axillary or supraclaviclar nodes. Right: bloody nipple discharge from 7 o clock, no mass, no palpable axillary or supraclvicular nodes CVS: S1, S2 no murmurs, no S3.Regular rate.  ABD: Soft non tender.   Ext: No edema  MS: Adequate ROM spine, shoulders, hips and reduced in reduced in left  Knee with swelling and tenderness on medial aspect.  Skin: Intact, no ulcerations or rash noted.  Psych: Good eye contact, normal affect. Memory intact not anxious or depressed appearing.  CNS: CN 2-12 intact, power,  normal throughout.no focal deficits noted.   Assessment & Plan  Bloody discharge from right nipple Acute  Onset,, 3 day history, no inciting trauma, positive f/hof  breast cancer, needs urgent imaging, no palpable breast mass  ESSENTIAL HYPERTENSION, BENIGN Controlled, no change in medication DASH diet and commitment to daily physical activity for a minimum of 30 minutes discussed and encouraged, as a part of hypertension management. The importance of attaining a healthy weight is also discussed.  BP/Weight 01/01/2018 09/14/2017 07/18/2016 02/18/2016 05/11/2015 01/08/2015 24/23/5361  Systolic BP 443 154 008 676 195 093 267  Diastolic BP 88 84 80 124 70 84 81  Wt. (Lbs) 228 226 213.12 214 207 213 201  BMI 43.08 42.7 40.27 40.46 39.13 40.27 38       Morbid obesity (HCC) Deteriorated. Patient re-educated about  the importance of commitment to a  minimum of 150 minutes of exercise per week.  The importance of healthy food choices with portion control discussed. Encouraged to start a food diary, count calories and to consider  joining a support group. Sample diet sheets offered. Goals set by the patient for the next several months.   Weight /BMI 01/01/2018 09/14/2017 07/18/2016  WEIGHT 228 lb 226 lb 213 lb 1.9 oz  HEIGHT 5\' 1"  5\' 1"  5\' 1"   BMI 43.08 kg/m2 42.7 kg/m2 40.27 kg/m2      Osteoarthritis of left knee Weight loss and muscle strengthening exercises encouraged. P encouraged t to use topical preps for pain relief

## 2018-01-03 ENCOUNTER — Ambulatory Visit
Admission: RE | Admit: 2018-01-03 | Discharge: 2018-01-03 | Disposition: A | Discharge status: Home / Self Care | Payer: BLUE CROSS/BLUE SHIELD | Source: Ambulatory Visit | Attending: Family Medicine | Admitting: Family Medicine

## 2018-01-03 ENCOUNTER — Other Ambulatory Visit: Payer: Self-pay

## 2018-01-03 DIAGNOSIS — N6452 Nipple discharge: Secondary | ICD-10-CM

## 2018-01-03 DIAGNOSIS — R928 Other abnormal and inconclusive findings on diagnostic imaging of breast: Secondary | ICD-10-CM | POA: Diagnosis not present

## 2018-01-05 ENCOUNTER — Encounter: Payer: Self-pay | Admitting: Family Medicine

## 2018-01-05 DIAGNOSIS — N6452 Nipple discharge: Secondary | ICD-10-CM | POA: Insufficient documentation

## 2018-01-05 DIAGNOSIS — M170 Bilateral primary osteoarthritis of knee: Secondary | ICD-10-CM | POA: Insufficient documentation

## 2018-01-05 NOTE — Assessment & Plan Note (Signed)
Acute  Onset,, 3 day history, no inciting trauma, positive f/hof breast cancer, needs urgent imaging, no palpable breast mass

## 2018-01-05 NOTE — Assessment & Plan Note (Signed)
Controlled, no change in medication DASH diet and commitment to daily physical activity for a minimum of 30 minutes discussed and encouraged, as a part of hypertension management. The importance of attaining a healthy weight is also discussed.  BP/Weight 01/01/2018 09/14/2017 07/18/2016 02/18/2016 05/11/2015 01/08/2015 83/25/4982  Systolic BP 641 583 094 076 808 811 031  Diastolic BP 88 84 80 594 70 84 81  Wt. (Lbs) 228 226 213.12 214 207 213 201  BMI 43.08 42.7 40.27 40.46 39.13 40.27 38

## 2018-01-05 NOTE — Assessment & Plan Note (Signed)
Deteriorated. Patient re-educated about  the importance of commitment to a  minimum of 150 minutes of exercise per week.  The importance of healthy food choices with portion control discussed. Encouraged to start a food diary, count calories and to consider  joining a support group. Sample diet sheets offered. Goals set by the patient for the next several months.   Weight /BMI 01/01/2018 09/14/2017 07/18/2016  WEIGHT 228 lb 226 lb 213 lb 1.9 oz  HEIGHT 5\' 1"  5\' 1"  5\' 1"   BMI 43.08 kg/m2 42.7 kg/m2 40.27 kg/m2

## 2018-01-05 NOTE — Assessment & Plan Note (Signed)
Weight loss and muscle strengthening exercises encouraged. P encouraged t to use topical preps for pain relief

## 2018-02-21 ENCOUNTER — Telehealth: Payer: Self-pay

## 2018-02-21 DIAGNOSIS — N6452 Nipple discharge: Secondary | ICD-10-CM

## 2018-02-21 NOTE — Telephone Encounter (Signed)
TEST REORDERED

## 2018-03-15 ENCOUNTER — Ambulatory Visit: Payer: BLUE CROSS/BLUE SHIELD | Admitting: Family Medicine

## 2018-03-15 ENCOUNTER — Encounter: Payer: Self-pay | Admitting: Family Medicine

## 2018-03-15 VITALS — BP 118/70 | HR 89 | Resp 16 | Ht 61.0 in | Wt 227.0 lb

## 2018-03-15 DIAGNOSIS — E559 Vitamin D deficiency, unspecified: Secondary | ICD-10-CM

## 2018-03-15 DIAGNOSIS — M170 Bilateral primary osteoarthritis of knee: Secondary | ICD-10-CM

## 2018-03-15 DIAGNOSIS — N6452 Nipple discharge: Secondary | ICD-10-CM

## 2018-03-15 DIAGNOSIS — R7303 Prediabetes: Secondary | ICD-10-CM | POA: Diagnosis not present

## 2018-03-15 DIAGNOSIS — I10 Essential (primary) hypertension: Secondary | ICD-10-CM | POA: Diagnosis not present

## 2018-03-15 MED ORDER — PHENTERMINE HCL 37.5 MG PO TABS: 37.5000 mg | ORAL_TABLET | Freq: Every day | ORAL | 1 refills | Status: DC

## 2018-03-15 NOTE — Patient Instructions (Signed)
F/u in 4 months, call if you need me sooner  Please schedule the MRI of to you=r breast before you leave  You will be  referred to a breast surgeon for consultation due to right  breast pain with pressure and ongoing bloody discharge with pressure at 4 o clock despite h/o 1 month  discontinuation    You need cBC, fasting lipid, cmp and EGFR, hBA1C , TSH and Vit D as soon as possible past due  Start half phentermine daily   10 pound weight loss is the goal   Thank you  for choosing [facility]. We consider it a privelige to serve you.  Delivering excellent health care in a caring and  compassionate way is our goal.  Partnering with you,  so that together we can achieve this goal is our strategy.

## 2018-03-16 ENCOUNTER — Telehealth: Payer: Self-pay | Admitting: Family Medicine

## 2018-03-16 ENCOUNTER — Other Ambulatory Visit: Payer: Self-pay | Admitting: Family Medicine

## 2018-03-16 NOTE — Assessment & Plan Note (Addendum)
Improved pain of left knee  and now experiencing similar but milder symptoms in right knee , weight loss needed to reduce wear and tear on there knee joints

## 2018-03-16 NOTE — Assessment & Plan Note (Signed)
still has heme positive discharge with pressure from right breast but no longer spontaneous bloody from the breast. Needs to f/u with recommended imaging and this is facilitated at the visit, and I am also referring her to a breast surgeon for follow up

## 2018-03-16 NOTE — Telephone Encounter (Signed)
Per [facility], patient needs prior auth for MRI of the breast.

## 2018-03-16 NOTE — Assessment & Plan Note (Signed)
Controlled, no change in medication DASH diet and commitment to daily physical activity for a minimum of 30 minutes discussed and encouraged, as a part of hypertension management. The importance of attaining a healthy weight is also discussed.  BP/Weight 03/15/2018 01/01/2018 09/14/2017 07/18/2016 02/18/2016 06/09/8863 05/06/7206  Systolic BP 218 288 337 445 146 047 998  Diastolic BP 70 88 84 80 721 70 84  Wt. (Lbs) 227 228 226 213.12 214 207 213  BMI 42.89 43.08 42.7 40.27 40.46 39.13 40.27

## 2018-03-16 NOTE — Progress Notes (Signed)
Megan Morales     MRN: [id number]      DOB: 03/03/1956   HPI Megan Morales is here for follow up and re-evaluation of chronic medical conditions, medication management and review of any available recent lab and radiology data.  Denies anymore spontaneous bloody nipple discharge from right breast in the past 1 month, and has yet to get the suggested MRI This was followed through at the visit, the department will only schedule the study with the patients and she is handed the pome to make the appointment I also intend to refer her to a breast surgeon for evaluation and any further management deemed necessary. On exam heme positive scant brown liquid can be extruded from the right breast one duct , far less copious  Than before, however she has pain and a suggestion of fullness at the 5 o'clock position of the right breast The PT denies any adverse reactions to current medications since the last visit. States that medication helped her right knee but now the left knee hurts intermittently, has been told by her spouse that it is her weight, wants to work more seriously on this but just loves to EAT, requests help with appetite suppressant and will change food choice   ROS Denies recent fever or chills. Denies sinus pressure, nasal congestion, ear pain or sore throat. Denies chest congestion, productive cough or wheezing. Denies chest pains, palpitations and leg swelling Denies abdominal pain, nausea, vomiting,diarrhea or constipation.   Denies dysuria, frequency, hesitancy or incontinence.  Denies headaches, seizures, numbness, or tingling. Denies depression, anxiety or insomnia. Denies skin break down or rash.   PE  BP 118/70   Pulse 89   Resp 16   Ht 5\' 1"  (1.549 m)   Wt 227 lb (103 kg)   SpO2 96%   BMI 42.89 kg/m   Patient alert and oriented and in no cardiopulmonary distress.  HEENT: No facial asymmetry, EOMI,   oropharynx pink and moist.  Neck supple no JVD, no mass.  Chest:  Clear to auscultation bilaterally. Breast: left breast no abnormality Right breast: brown heme positive fluid with direct pressure from singly duct and tender fullness on ;lower inner quadrant CVS: S1, S2 no murmurs, no S3.Regular rate.  ABD: Soft non tender.   Ext: No edema  MS: Adequate ROM spine, shoulders, hips and slightly reduced in the  knees.  Skin: Intact, no ulcerations or rash noted.  Psych: Good eye contact, normal affect. Memory intact not anxious or depressed appearing.  CNS: CN 2-12 intact, power,  normal throughout.no focal deficits noted.   Assessment & Plan  ESSENTIAL HYPERTENSION, BENIGN Controlled, no change in medication DASH diet and commitment to daily physical activity for a minimum of 30 minutes discussed and encouraged, as a part of hypertension management. The importance of attaining a healthy weight is also discussed.  BP/Weight 03/15/2018 01/01/2018 09/14/2017 07/18/2016 02/18/2016 02/08/5276 05/04/4234  Systolic BP 361 443 154 008 676 195 093  Diastolic BP 70 88 84 80 267 70 84  Wt. (Lbs) 227 228 226 213.12 214 207 213  BMI 42.89 43.08 42.7 40.27 40.46 39.13 40.27       Morbid obesity (HCC) Unchanged, start half phentermine daily, weight loss goal of 10 pounds in 4 months. Patient re-educated about  the importance of commitment to a  minimum of 150 minutes of exercise per week.  The importance of healthy food choices with portion control discussed. Encouraged to start a food diary, count calories and  to consider  joining a support group. Sample diet sheets offered. Goals set by the patient for the next several months.   Weight /BMI 03/15/2018 01/01/2018 09/14/2017  WEIGHT 227 lb 228 lb 226 lb  HEIGHT 5\' 1"  5\' 1"  5\' 1"   BMI 42.89 kg/m2 43.08 kg/m2 42.7 kg/m2      Bloody discharge from right nipple still has heme positive discharge with pressure from right breast but no longer spontaneous bloody from the breast. Needs to f/u with recommended  imaging and this is facilitated at the visit, and I am also referring her to a breast surgeon for follow up  Osteoarthritis of both knees Improved pain of left knee  and now experiencing similar but milder symptoms in right knee , weight loss needed to reduce wear and tear on there knee joints

## 2018-03-16 NOTE — Assessment & Plan Note (Signed)
Unchanged, start half phentermine daily, weight loss goal of 10 pounds in 4 months. Patient re-educated about  the importance of commitment to a  minimum of 150 minutes of exercise per week.  The importance of healthy food choices with portion control discussed. Encouraged to start a food diary, count calories and to consider  joining a support group. Sample diet sheets offered. Goals set by the patient for the next several months.   Weight /BMI 03/15/2018 01/01/2018 09/14/2017  WEIGHT 227 lb 228 lb 226 lb  HEIGHT 5\' 1"  5\' 1"  5\' 1"   BMI 42.89 kg/m2 43.08 kg/m2 42.7 kg/m2

## 2018-03-19 ENCOUNTER — Other Ambulatory Visit: Payer: Self-pay | Admitting: Family Medicine

## 2018-03-19 NOTE — Telephone Encounter (Signed)
This was done already and according to notes is valid through 7/16

## 2018-03-20 ENCOUNTER — Ambulatory Visit
Admission: RE | Admit: 2018-03-20 | Discharge: 2018-03-20 | Disposition: A | Discharge status: Home / Self Care | Payer: BLUE CROSS/BLUE SHIELD | Source: Ambulatory Visit | Attending: Family Medicine | Admitting: Family Medicine

## 2018-03-20 DIAGNOSIS — Z803 Family history of malignant neoplasm of breast: Secondary | ICD-10-CM | POA: Diagnosis not present

## 2018-03-20 DIAGNOSIS — N6041 Mammary duct ectasia of right breast: Secondary | ICD-10-CM | POA: Diagnosis not present

## 2018-03-20 DIAGNOSIS — N6452 Nipple discharge: Secondary | ICD-10-CM

## 2018-03-20 MED ORDER — GADOBENATE DIMEGLUMINE 529 MG/ML IV SOLN
20.0000 mL | Freq: Once | INTRAVENOUS | Status: AC | PRN
  Administered 2018-03-20: 20 mL via INTRAVENOUS

## 2018-03-22 ENCOUNTER — Other Ambulatory Visit: Payer: Self-pay | Admitting: Family Medicine

## 2018-03-22 ENCOUNTER — Telehealth: Payer: Self-pay | Admitting: Family Medicine

## 2018-03-22 DIAGNOSIS — R9389 Abnormal findings on diagnostic imaging of other specified body structures: Secondary | ICD-10-CM

## 2018-03-22 NOTE — Telephone Encounter (Signed)
Called the [facility] to have a guided biopsy of right breast ordered/scheduled as requested. Tonya transferred me to [facility] whom ordered and scheduled patient for Monday 03/26/18 at 8am.  She is sending you the order electronically to sign. Patient is aware of time & location of appointment.

## 2018-03-22 NOTE — Telephone Encounter (Signed)
Noted and signed, thanks!

## 2018-03-26 ENCOUNTER — Ambulatory Visit
Admission: RE | Admit: 2018-03-26 | Discharge: 2018-03-26 | Disposition: A | Discharge status: Home / Self Care | Payer: BLUE CROSS/BLUE SHIELD | Source: Ambulatory Visit | Attending: Family Medicine | Admitting: Family Medicine

## 2018-03-26 DIAGNOSIS — N6341 Unspecified lump in right breast, subareolar: Secondary | ICD-10-CM | POA: Diagnosis not present

## 2018-03-26 DIAGNOSIS — R9389 Abnormal findings on diagnostic imaging of other specified body structures: Secondary | ICD-10-CM

## 2018-03-26 DIAGNOSIS — N6452 Nipple discharge: Secondary | ICD-10-CM | POA: Diagnosis not present

## 2018-03-26 HISTORY — PX: BREAST BIOPSY: SHX20

## 2018-03-26 MED ORDER — GADOBENATE DIMEGLUMINE 529 MG/ML IV SOLN
20.0000 mL | Freq: Once | INTRAVENOUS | Status: AC | PRN
  Administered 2018-03-26: 20 mL via INTRAVENOUS

## 2018-03-29 DIAGNOSIS — N6452 Nipple discharge: Secondary | ICD-10-CM | POA: Diagnosis not present

## 2018-04-13 ENCOUNTER — Other Ambulatory Visit: Payer: Self-pay

## 2018-04-13 MED ORDER — BENAZEPRIL HCL 40 MG PO TABS: 40.0000 mg | ORAL_TABLET | Freq: Every day | ORAL | 0 refills | Status: DC

## 2018-04-13 MED ORDER — AMLODIPINE BESYLATE 10 MG PO TABS: 10.0000 mg | ORAL_TABLET | Freq: Every day | ORAL | 0 refills | Status: DC

## 2018-07-17 ENCOUNTER — Encounter: Payer: Self-pay | Admitting: Family Medicine

## 2018-07-17 ENCOUNTER — Ambulatory Visit: Payer: BLUE CROSS/BLUE SHIELD | Admitting: Family Medicine

## 2018-07-17 VITALS — BP 128/82 | HR 98 | Resp 12 | Ht 61.0 in | Wt 227.1 lb

## 2018-07-17 DIAGNOSIS — Z23 Encounter for immunization: Secondary | ICD-10-CM

## 2018-07-17 DIAGNOSIS — I10 Essential (primary) hypertension: Secondary | ICD-10-CM

## 2018-07-17 DIAGNOSIS — N6452 Nipple discharge: Secondary | ICD-10-CM | POA: Diagnosis not present

## 2018-07-17 DIAGNOSIS — R7303 Prediabetes: Secondary | ICD-10-CM

## 2018-07-17 MED ORDER — POTASSIUM CHLORIDE ER 10 MEQ PO TBCR: 10.0000 meq | EXTENDED_RELEASE_TABLET | Freq: Every day | ORAL | 2 refills | Status: DC

## 2018-07-17 MED ORDER — HYDROCHLOROTHIAZIDE 12.5 MG PO CAPS: 12.5000 mg | ORAL_CAPSULE | Freq: Every day | ORAL | 1 refills | Status: DC

## 2018-07-17 NOTE — Patient Instructions (Addendum)
Physical with pap December 14 or after, with shingrix #1  Mindful eating  Resume HCTZ 12.5 mg and potassium 10 meq one daily  Flu vaccine Fasting labs next week please take order with you again  It is important that you exercise regularly at least 30 minutes 5 times a week. If you develop chest pain, have severe difficulty breathing, or feel very tired, stop exercising immediately and seek medical attention     Youi are referred to Dr Marlou Starks , very important to keep the appt

## 2018-07-17 NOTE — Assessment & Plan Note (Addendum)
Reports no bloody discharge of any kind from right or left breast since June, however will f/u with surgeon as planned, I will refer and have appt scheduled

## 2018-07-29 ENCOUNTER — Encounter: Payer: Self-pay | Admitting: Family Medicine

## 2018-07-29 MED ORDER — HYDROCHLOROTHIAZIDE 12.5 MG PO CAPS: 12.5000 mg | ORAL_CAPSULE | Freq: Every day | ORAL | 3 refills | Status: DC

## 2018-07-29 NOTE — Progress Notes (Signed)
Megan Morales     MRN: [id number]      DOB: March 22, 1956   HPI Megan Morales is here for follow up and re-evaluation of chronic medical conditions, medication management and review of any available recent lab and radiology data.  Preventive health is updated, specifically  Cancer screening and Immunization.   Questions or concerns regarding consultations or procedures which the PT has had in the interim are  addressed. The PT denies any adverse reactions to current medications since the last visit.  There are no new concerns.  There are no specific complaints  Denies any recent bloody nipple d/c , however , needs to keep f/u with surgery, and I explained this to her. 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 Denies sinus pressure, nasal congestion, ear pain or sore throat. Denies chest congestion, productive cough or wheezing. Denies chest pains, palpitations and leg swelling Denies abdominal pain, nausea, vomiting,diarrhea or constipation.   Denies dysuria, frequency, hesitancy or incontinence. Denies joint pain, swelling and limitation in mobility. Denies headaches, seizures, numbness, or tingling. Denies depression, does c/o increased  anxiety denies  insomnia. Denies skin break down or rash.   PE  BP 128/82 (BP Location: Right Arm, Patient Position: Sitting, Cuff Size: Large)   Pulse 98   Resp 12   Ht 5\' 1"  (1.549 m)   Wt 227 lb 1.9 oz (103 kg)   SpO2 99% Comment: room air  BMI 42.91 kg/m    Patient alert and oriented and in no cardiopulmonary distress.  HEENT: No facial asymmetry, EOMI,   oropharynx pink and moist.  Neck supple no JVD, no mass.  Chest: Clear to auscultation bilaterally.  CVS: S1, S2 no murmurs, no S3.Regular rate.  ABD: Soft non tender.   Ext: No edema  MS: Adequate ROM spine, shoulders, hips and knees.  Skin: Intact, no ulcerations or rash noted.  Psych:  Good eye contact, normal affect. Memory intact not anxious or depressed appearing.  CNS: CN 2-12 intact, power,  normal throughout.no focal deficits noted.   Assessment & Plan  Bloody discharge from right nipple Reports no bloody discharge of any kind from right or left breast since June, however will f/u with surgeon as planned, I will refer and have appt scheduled  ESSENTIAL HYPERTENSION, BENIGN Controlled, resume half HCTZ and potassium DASH diet and commitment to daily physical activity for a minimum of 30 minutes discussed and encouraged, as a part of hypertension management. The importance of attaining a healthy weight is also discussed.  BP/Weight 07/17/2018 03/15/2018 01/01/2018 09/14/2017 07/18/2016 9/52/8413 11/06/4008  Systolic BP 272 536 644 034 742 595 638  Diastolic BP 82 70 88 84 80 100 70  Wt. (Lbs) 227.12 227 228 226 213.12 214 207  BMI 42.91 42.89 43.08 42.7 40.27 40.46 39.13        Morbid obesity (HCC) Unchanged, phentermine of no benefit. Patient re-educated about  the importance of commitment to a  minimum of 150 minutes of exercise per week.  The importance of healthy food choices with portion control discussed. Encouraged to start a food diary, count calories and to consider  joining a support group. Sample diet sheets offered. Goals set by the patient for the next several months.   Weight /BMI 07/17/2018 03/15/2018 01/01/2018  WEIGHT 227 lb 1.9 oz 227 lb 228 lb  HEIGHT 5\' 1"  5\' 1"  5\' 1"   BMI 42.91 kg/m2 42.89 kg/m2  43.08 kg/m2      Prediabetes Patient educated about the importance of limiting  Carbohydrate intake , the need to commit to daily physical activity for a minimum of 30 minutes , and to commit weight loss. The fact that changes in all these areas will reduce or eliminate all together the development of diabetes is stressed.  Updated lab needed at/ before next visit.   Diabetic Labs Latest Ref Rng & Units 09/07/2017 07/13/2016 02/16/2016 05/08/2015  01/06/2015  HbA1c <5.7 % of total Hgb 5.8(H) 5.5 5.9(H) 5.8(H) 6.0(H)  Chol 125 - 200 mg/dL - 173 - 155 -  HDL >=46 mg/dL - 56 - 57 -  Calc LDL <130 mg/dL - 106 - 88 -  Triglycerides <150 mg/dL - 53 - 50 -  Creatinine 0.50 - 0.99 mg/dL 0.83 0.89 0.89 0.81 0.78   BP/Weight 07/17/2018 03/15/2018 01/01/2018 09/14/2017 07/18/2016 2/33/0076 11/05/6331  Systolic BP 545 625 638 937 342 876 811  Diastolic BP 82 70 88 84 80 100 70  Wt. (Lbs) 227.12 227 228 226 213.12 214 207  BMI 42.91 42.89 43.08 42.7 40.27 40.46 39.13   No flowsheet data found.

## 2018-07-29 NOTE — Assessment & Plan Note (Addendum)
Unchanged, phentermine of no benefit. Patient re-educated about  the importance of commitment to a  minimum of 150 minutes of exercise per week.  The importance of healthy food choices with portion control discussed. Encouraged to start a food diary, count calories and to consider  joining a support group. Sample diet sheets offered. Goals set by the patient for the next several months.   Weight /BMI 07/17/2018 03/15/2018 01/01/2018  WEIGHT 227 lb 1.9 oz 227 lb 228 lb  HEIGHT 5\' 1"  5\' 1"  5\' 1"   BMI 42.91 kg/m2 42.89 kg/m2 43.08 kg/m2

## 2018-07-29 NOTE — Assessment & Plan Note (Signed)
Patient educated about the importance of limiting  Carbohydrate intake , the need to commit to daily physical activity for a minimum of 30 minutes , and to commit weight loss. The fact that changes in all these areas will reduce or eliminate all together the development of diabetes is stressed.  Updated lab needed at/ before next visit.   Diabetic Labs Latest Ref Rng & Units 09/07/2017 07/13/2016 02/16/2016 05/08/2015 01/06/2015  HbA1c <5.7 % of total Hgb 5.8(H) 5.5 5.9(H) 5.8(H) 6.0(H)  Chol 125 - 200 mg/dL - 173 - 155 -  HDL >=46 mg/dL - 56 - 57 -  Calc LDL <130 mg/dL - 106 - 88 -  Triglycerides <150 mg/dL - 53 - 50 -  Creatinine 0.50 - 0.99 mg/dL 0.83 0.89 0.89 0.81 0.78   BP/Weight 07/17/2018 03/15/2018 01/01/2018 09/14/2017 07/18/2016 5/49/8264 10/08/8307  Systolic BP 407 680 881 103 159 458 592  Diastolic BP 82 70 88 84 80 100 70  Wt. (Lbs) 227.12 227 228 226 213.12 214 207  BMI 42.91 42.89 43.08 42.7 40.27 40.46 39.13   No flowsheet data found.

## 2018-07-29 NOTE — Assessment & Plan Note (Signed)
Controlled, resume half HCTZ and potassium DASH diet and commitment to daily physical activity for a minimum of 30 minutes discussed and encouraged, as a part of hypertension management. The importance of attaining a healthy weight is also discussed.  BP/Weight 07/17/2018 03/15/2018 01/01/2018 09/14/2017 07/18/2016 06/27/9323 10/11/9142  Systolic BP 458 483 507 573 225 672 091  Diastolic BP 82 70 88 84 80 100 70  Wt. (Lbs) 227.12 227 228 226 213.12 214 207  BMI 42.91 42.89 43.08 42.7 40.27 40.46 39.13

## 2018-08-13 ENCOUNTER — Other Ambulatory Visit: Payer: Self-pay | Admitting: General Surgery

## 2018-08-13 DIAGNOSIS — N6452 Nipple discharge: Secondary | ICD-10-CM

## 2018-09-21 DIAGNOSIS — N6452 Nipple discharge: Secondary | ICD-10-CM | POA: Diagnosis not present

## 2018-09-28 DIAGNOSIS — I10 Essential (primary) hypertension: Secondary | ICD-10-CM | POA: Diagnosis not present

## 2018-09-28 DIAGNOSIS — E559 Vitamin D deficiency, unspecified: Secondary | ICD-10-CM | POA: Diagnosis not present

## 2018-09-28 DIAGNOSIS — R7303 Prediabetes: Secondary | ICD-10-CM | POA: Diagnosis not present

## 2018-09-29 LAB — CBC
HCT: 39.7 % (ref 35.0–45.0)
Hemoglobin: 13.7 g/dL (ref 11.7–15.5)
MCH: 31 pg (ref 27.0–33.0)
MCHC: 34.5 g/dL (ref 32.0–36.0)
MCV: 89.8 fL (ref 80.0–100.0)
MPV: 10.6 fL (ref 7.5–12.5)
PLATELETS: 208 10*3/uL (ref 140–400)
RBC: 4.42 10*6/uL (ref 3.80–5.10)
RDW: 13.3 % (ref 11.0–15.0)
WBC: 4.3 10*3/uL (ref 3.8–10.8)

## 2018-09-29 LAB — COMPLETE METABOLIC PANEL WITH GFR
AG RATIO: 1.1 (calc) (ref 1.0–2.5)
ALT: 15 U/L (ref 6–29)
AST: 16 U/L (ref 10–35)
Albumin: 4.1 g/dL (ref 3.6–5.1)
Alkaline phosphatase (APISO): 127 U/L (ref 33–130)
BUN/Creatinine Ratio: 16 (calc) (ref 6–22)
BUN: 18 mg/dL (ref 7–25)
CALCIUM: 9.3 mg/dL (ref 8.6–10.4)
CO2: 27 mmol/L (ref 20–32)
CREATININE: 1.1 mg/dL — AB (ref 0.50–0.99)
Chloride: 104 mmol/L (ref 98–110)
GFR, EST AFRICAN AMERICAN: 63 mL/min/{1.73_m2} (ref >=60)
GFR, Est Non African American: 54 mL/min/{1.73_m2} — ABNORMAL LOW (ref >=60)
Globulin: 3.6 g/dL (calc) (ref 1.9–3.7)
Glucose, Bld: 82 mg/dL (ref 65–99)
Potassium: 3.9 mmol/L (ref 3.5–5.3)
Sodium: 140 mmol/L (ref 135–146)
Total Bilirubin: 0.4 mg/dL (ref 0.2–1.2)
Total Protein: 7.7 g/dL (ref 6.1–8.1)

## 2018-09-29 LAB — TSH: TSH: 1.89 mIU/L (ref 0.40–4.50)

## 2018-09-29 LAB — LIPID PANEL
CHOLESTEROL: 180 mg/dL (ref <200)
HDL: 49 mg/dL — ABNORMAL LOW (ref >50)
LDL Cholesterol (Calc): 116 mg/dL (calc) — ABNORMAL HIGH
Non-HDL Cholesterol (Calc): 131 mg/dL (calc) — ABNORMAL HIGH (ref <130)
Total CHOL/HDL Ratio: 3.7 (calc) (ref <5.0)
Triglycerides: 60 mg/dL (ref <150)

## 2018-09-29 LAB — HEMOGLOBIN A1C
EAG (MMOL/L): 6.6 (calc)
Hgb A1c MFr Bld: 5.8 % of total Hgb — ABNORMAL HIGH (ref <5.7)
Mean Plasma Glucose: 120 (calc)

## 2018-09-29 LAB — VITAMIN D 25 HYDROXY (VIT D DEFICIENCY, FRACTURES): VIT D 25 HYDROXY: 14 ng/mL — AB (ref 30–100)

## 2018-10-01 ENCOUNTER — Ambulatory Visit
Admission: RE | Admit: 2018-10-01 | Discharge: 2018-10-01 | Disposition: A | Discharge status: Home / Self Care | Payer: BLUE CROSS/BLUE SHIELD | Source: Ambulatory Visit | Attending: General Surgery | Admitting: General Surgery

## 2018-10-01 ENCOUNTER — Other Ambulatory Visit: Payer: Self-pay | Admitting: General Surgery

## 2018-10-01 DIAGNOSIS — R920 Mammographic microcalcification found on diagnostic imaging of breast: Secondary | ICD-10-CM | POA: Diagnosis not present

## 2018-10-01 DIAGNOSIS — N6452 Nipple discharge: Secondary | ICD-10-CM

## 2018-10-01 DIAGNOSIS — N6310 Unspecified lump in the right breast, unspecified quadrant: Secondary | ICD-10-CM

## 2018-10-02 ENCOUNTER — Ambulatory Visit (INDEPENDENT_AMBULATORY_CARE_PROVIDER_SITE_OTHER): Payer: BLUE CROSS/BLUE SHIELD | Admitting: Family Medicine

## 2018-10-02 ENCOUNTER — Other Ambulatory Visit (HOSPITAL_COMMUNITY)
Admission: RE | Admit: 2018-10-02 | Discharge: 2018-10-02 | Disposition: A | Discharge status: Home / Self Care | Payer: BLUE CROSS/BLUE SHIELD | Source: Ambulatory Visit | Attending: Family Medicine | Admitting: Family Medicine

## 2018-10-02 ENCOUNTER — Encounter: Payer: Self-pay | Admitting: Family Medicine

## 2018-10-02 VITALS — BP 118/80 | HR 87 | Resp 15 | Ht 61.0 in | Wt 221.0 lb

## 2018-10-02 DIAGNOSIS — Z124 Encounter for screening for malignant neoplasm of cervix: Secondary | ICD-10-CM | POA: Diagnosis not present

## 2018-10-02 DIAGNOSIS — E559 Vitamin D deficiency, unspecified: Secondary | ICD-10-CM

## 2018-10-02 DIAGNOSIS — Z23 Encounter for immunization: Secondary | ICD-10-CM | POA: Diagnosis not present

## 2018-10-02 DIAGNOSIS — Z0000 Encounter for general adult medical examination without abnormal findings: Secondary | ICD-10-CM | POA: Diagnosis not present

## 2018-10-02 MED ORDER — ERGOCALCIFEROL 1.25 MG (50000 UT) PO CAPS: 50000.0000 [IU] | ORAL_CAPSULE | ORAL | 1 refills | Status: DC

## 2018-10-02 MED ORDER — PHENTERMINE HCL 37.5 MG PO TABS: 37.5000 mg | ORAL_TABLET | Freq: Every day | ORAL | 1 refills | Status: DC

## 2018-10-02 NOTE — Progress Notes (Signed)
    Megan Morales     MRN: [id number]      DOB: November 23, 1955  HPI: Patient is in for annual physical exam. Concerned as recently had right breast biopsy and is awaiting result Recent labs, are reviewed. Immunization is reviewed , and  updated .   PE:  BP 118/80   Pulse 87   Resp 15   Ht 5\' 1"  (1.549 m)   Wt 221 lb (100.2 kg)   SpO2 99%   BMI 41.76 kg/m   Pleasant  female, alert and oriented x 3, in no cardio-pulmonary distress. Afebrile. HEENT No facial trauma or asymetry. Sinuses non tender.  Extra occullar muscles intact, pupils equally reactive to light. External ears normal, tympanic membranes clear. Oropharynx moist, no exudate. Neck: supple, no adenopathy,JVD or thyromegaly.No bruits.  Chest: Clear to ascultation bilaterally.No crackles or wheezes. Non tender to palpation  Breast:  asymetry,no masses or lumps. No tenderness. No nipple discharge right nipple  inversion. No axillary or supraclavicular adenopathy  Cardiovascular system; Heart sounds normal,  S1 and  S2 ,no S3.  No murmur, or thrill. Apical beat not displaced Peripheral pulses normal.  Abdomen: Soft, non tender, no organomegaly or masses. No bruits. Bowel sounds normal. No guarding, tenderness or rebound.  .  GU: External genitalia normal female genitalia , normal female distribution of hair. No lesions. Urethral meatus normal in size, no  Prolapse, no lesions visibly  Present. Bladder non tender. Vagina pink and moist , with no visible lesions , discharge present . Adequate pelvic support no  cystocele or rectocele noted Cervix pink and appears healthy, no lesions or ulcerations noted, no discharge noted from os Uterus normal size, no adnexal masses, no cervical motion or adnexal tenderness.   Musculoskeletal exam: Full ROM of spine, hips , shoulders and knees. No deformity ,swelling or crepitus noted. No muscle wasting or atrophy.   Neurologic: Cranial nerves 2 to 12  intact. Power, tone ,sensation and reflexes normal throughout. No disturbance in gait. No tremor.  Skin: Intact, no ulceration, erythema , scaling or rash noted. Pigmentation normal throughout  Psych; Anxious and mildly flat affect as worried about breast biopsy  Judgement and concentration normal   Assessment & Plan:  Annual physical exam Annual exam as documented. Counseling done  re healthy lifestyle involving commitment to 150 minutes exercise per week, heart healthy diet, and attaining healthy weight.The importance of adequate sleep also discussed.  Changes in health habits are decided on by the patient with goals and time frames  set for achieving them. Immunization and cancer screening needs are specifically addressed at this visit.   Need for shingles vaccine Snfrix #1 administered, no adverse effect at time of administration  Morbid obesity ([place]) Improved Patient re-educated about  the importance of commitment to a  minimum of 150 minutes of exercise per week.  The importance of healthy food choices with portion control discussed. Encouraged to start a food diary, count calories and to consider  joining a support group. Sample diet sheets offered. Goals set by the patient for the next several months.   Weight /BMI 10/02/2018 07/17/2018 03/15/2018  WEIGHT 221 lb 227 lb 1.9 oz 227 lb  HEIGHT 5\' 1"  5\' 1"  5\' 1"   BMI 41.76 kg/m2 42.91 kg/m2 42.89 kg/m2      Vitamin D deficiency Pt to start once weekly vit D and this is prescribed for 6 months

## 2018-10-02 NOTE — Patient Instructions (Signed)
F/U in 4 months, call if you need me before'  New is once weekly vitamin d  Continue HALF phentermine daily , and stop sugar, commit to fruit for sugar instead  Pap sent and zostavax today  It is important that you exercise regularly at least 30 minutes 5 times a week. If you develop chest pain, have severe difficulty breathing, or feel very tired, stop exercising immediately and seek medical attention    Thank you  for choosing [facility]. We consider it a privelige to serve you.  Delivering excellent health care in a caring and  compassionate way is our goal.  Partnering with you,  so that together we can achieve this goal is our strategy.

## 2018-10-05 ENCOUNTER — Encounter: Payer: Self-pay | Admitting: Family Medicine

## 2018-10-05 DIAGNOSIS — Z23 Encounter for immunization: Secondary | ICD-10-CM | POA: Insufficient documentation

## 2018-10-05 LAB — CYTOLOGY - PAP
ADEQUACY: ABSENT
DIAGNOSIS: NEGATIVE
HPV (WINDOPATH): NOT DETECTED

## 2018-10-05 NOTE — Assessment & Plan Note (Signed)
Annual exam as documented. Counseling done  re healthy lifestyle involving commitment to 150 minutes exercise per week, heart healthy diet, and attaining healthy weight.The importance of adequate sleep also discussed. Changes in health habits are decided on by the patient with goals and time frames  set for achieving them. Immunization and cancer screening needs are specifically addressed at this visit. 

## 2018-10-05 NOTE — Assessment & Plan Note (Signed)
Improved Patient re-educated about  the importance of commitment to a  minimum of 150 minutes of exercise per week.  The importance of healthy food choices with portion control discussed. Encouraged to start a food diary, count calories and to consider  joining a support group. Sample diet sheets offered. Goals set by the patient for the next several months.   Weight /BMI 10/02/2018 07/17/2018 03/15/2018  WEIGHT 221 lb 227 lb 1.9 oz 227 lb  HEIGHT 5\' 1"  5\' 1"  5\' 1"   BMI 41.76 kg/m2 42.91 kg/m2 42.89 kg/m2

## 2018-10-05 NOTE — Assessment & Plan Note (Signed)
Pt to start once weekly vit D and this is prescribed for 6 months

## 2018-10-05 NOTE — Assessment & Plan Note (Signed)
Snfrix #1 administered, no adverse effect at time of administration

## 2018-10-10 ENCOUNTER — Other Ambulatory Visit: Payer: Self-pay | Admitting: General Surgery

## 2018-10-10 ENCOUNTER — Ambulatory Visit
Admission: RE | Admit: 2018-10-10 | Discharge: 2018-10-10 | Disposition: A | Discharge status: Home / Self Care | Payer: BLUE CROSS/BLUE SHIELD | Source: Ambulatory Visit | Attending: General Surgery | Admitting: General Surgery

## 2018-10-10 DIAGNOSIS — N6452 Nipple discharge: Secondary | ICD-10-CM

## 2018-10-10 DIAGNOSIS — N6041 Mammary duct ectasia of right breast: Secondary | ICD-10-CM | POA: Diagnosis not present

## 2018-10-10 DIAGNOSIS — N6310 Unspecified lump in the right breast, unspecified quadrant: Secondary | ICD-10-CM

## 2018-10-10 DIAGNOSIS — N6313 Unspecified lump in the right breast, lower outer quadrant: Secondary | ICD-10-CM | POA: Diagnosis not present

## 2018-10-10 HISTORY — PX: BREAST BIOPSY: SHX20

## 2018-10-25 ENCOUNTER — Encounter (HOSPITAL_COMMUNITY): Payer: Self-pay | Admitting: Lab

## 2018-10-25 ENCOUNTER — Emergency Department (HOSPITAL_COMMUNITY)
Admission: EM | Admit: 2018-10-25 | Discharge: 2018-10-25 | Disposition: A | Discharge status: Home / Self Care | Payer: BLUE CROSS/BLUE SHIELD | Attending: Emergency Medicine | Admitting: Emergency Medicine

## 2018-10-25 ENCOUNTER — Emergency Department (HOSPITAL_COMMUNITY): Payer: BLUE CROSS/BLUE SHIELD

## 2018-10-25 DIAGNOSIS — M5440 Lumbago with sciatica, unspecified side: Secondary | ICD-10-CM | POA: Diagnosis not present

## 2018-10-25 DIAGNOSIS — R0789 Other chest pain: Secondary | ICD-10-CM | POA: Diagnosis not present

## 2018-10-25 DIAGNOSIS — Z79899 Other long term (current) drug therapy: Secondary | ICD-10-CM | POA: Insufficient documentation

## 2018-10-25 DIAGNOSIS — I10 Essential (primary) hypertension: Secondary | ICD-10-CM | POA: Insufficient documentation

## 2018-10-25 DIAGNOSIS — M545 Low back pain: Secondary | ICD-10-CM | POA: Diagnosis present

## 2018-10-25 DIAGNOSIS — M5441 Lumbago with sciatica, right side: Secondary | ICD-10-CM | POA: Insufficient documentation

## 2018-10-25 LAB — URINALYSIS, ROUTINE W REFLEX MICROSCOPIC
Bilirubin Urine: NEGATIVE
GLUCOSE, UA: NEGATIVE mg/dL
Hgb urine dipstick: NEGATIVE
KETONES UR: NEGATIVE mg/dL
LEUKOCYTES UA: NEGATIVE
NITRITE: NEGATIVE
PROTEIN: NEGATIVE mg/dL
Specific Gravity, Urine: 1.023 (ref 1.005–1.030)
pH: 6 (ref 5.0–8.0)

## 2018-10-25 LAB — CBC WITH DIFFERENTIAL/PLATELET
Abs Immature Granulocytes: 0.02 10*3/uL (ref 0.00–0.07)
BASOS PCT: 1 %
Basophils Absolute: 0 10*3/uL (ref 0.0–0.1)
Eosinophils Absolute: 0.2 10*3/uL (ref 0.0–0.5)
Eosinophils Relative: 4 %
HCT: 39 % (ref 36.0–46.0)
Hemoglobin: 12.6 g/dL (ref 12.0–15.0)
IMMATURE GRANULOCYTES: 0 %
Lymphocytes Relative: 29 %
Lymphs Abs: 1.4 10*3/uL (ref 0.7–4.0)
MCH: 29.8 pg (ref 26.0–34.0)
MCHC: 32.3 g/dL (ref 30.0–36.0)
MCV: 92.2 fL (ref 80.0–100.0)
MONOS PCT: 7 %
Monocytes Absolute: 0.4 10*3/uL (ref 0.1–1.0)
NEUTROS PCT: 59 %
Neutro Abs: 2.9 10*3/uL (ref 1.7–7.7)
PLATELETS: 200 10*3/uL (ref 150–400)
RBC: 4.23 MIL/uL (ref 3.87–5.11)
RDW: 12.9 % (ref 11.5–15.5)
WBC: 4.9 10*3/uL (ref 4.0–10.5)
nRBC: 0 % (ref 0.0–0.2)

## 2018-10-25 LAB — COMPREHENSIVE METABOLIC PANEL
ALT: 19 U/L (ref 0–44)
AST: 19 U/L (ref 15–41)
Albumin: 3.6 g/dL (ref 3.5–5.0)
Alkaline Phosphatase: 104 U/L (ref 38–126)
Anion gap: 7 (ref 5–15)
BUN: 12 mg/dL (ref 8–23)
CALCIUM: 9.2 mg/dL (ref 8.9–10.3)
CHLORIDE: 107 mmol/L (ref 98–111)
CO2: 25 mmol/L (ref 22–32)
CREATININE: 0.85 mg/dL (ref 0.44–1.00)
Glucose, Bld: 112 mg/dL — ABNORMAL HIGH (ref 70–99)
Potassium: 3.4 mmol/L — ABNORMAL LOW (ref 3.5–5.1)
Sodium: 139 mmol/L (ref 135–145)
TOTAL PROTEIN: 7.4 g/dL (ref 6.5–8.1)
Total Bilirubin: 0.3 mg/dL (ref 0.3–1.2)

## 2018-10-25 LAB — LIPASE, BLOOD: Lipase: 30 U/L (ref 11–51)

## 2018-10-25 MED ORDER — METHOCARBAMOL 500 MG PO TABS: 500.0000 mg | ORAL_TABLET | Freq: Two times a day (BID) | ORAL | 0 refills | Status: AC

## 2018-10-25 MED ORDER — DIAZEPAM 2 MG PO TABS
2.0000 mg | ORAL_TABLET | Freq: Once | ORAL | Status: AC
  Administered 2018-10-25: 2 mg via ORAL
  Filled 2018-10-25: qty 1

## 2018-10-25 MED ORDER — METHOCARBAMOL 500 MG PO TABS: 500.0000 mg | ORAL_TABLET | Freq: Once | ORAL | Status: DC

## 2018-10-25 NOTE — ED Notes (Signed)
Pt requesting to go to bathroom for bowel movement.  Upon arrival to room, pt was diaphoretic and lightheaded.

## 2018-10-25 NOTE — ED Provider Notes (Signed)
[facility] EMERGENCY DEPARTMENT Provider Note   CSN: [id number] Arrival date & time: 10/25/18  1558     History   Chief Complaint Chief Complaint  Patient presents with  . Back Pain    HPI Megan Morales is a 63 y.o. female.  64 y.o female with a PMH of HTN presents to the ED with a chief complaint of back pain x this morning.  Reports her back pain is located on the lower region worst on the left side, describes it as an ache except before when she moves this makes the pain feels sharp in nature.  There are no alleviating symptoms with this complaint.  She has tried no medical therapy for relieving symptoms.  She denies any fever, IV drug use, history of cancer, abdominal pain, leg weakness, swelling to her legs.     Past Medical History:  Diagnosis Date  . Hypertension     Patient Active Problem List   Diagnosis Date Noted  . Need for shingles vaccine 10/05/2018  . Bloody discharge from right nipple 01/05/2018  . Osteoarthritis of both knees 01/05/2018  . Morbid obesity ([place]) 01/08/2015  . Annual physical exam 06/09/2014  . Vitamin D deficiency 01/02/2013  . Benign skin lesion of neck 12/31/2012  . Prediabetes 04/07/2011  . FIBROIDS, UTERUS 10/26/2007  . ESSENTIAL HYPERTENSION, BENIGN 10/26/2007    Past Surgical History:  Procedure Laterality Date  . BREAST BIOPSY    . MYOMECTOMY  2010  . TUBAL LIGATION  1992     OB History   No obstetric history on file.      Home Medications    Prior to Admission medications   Medication Sig Start Date End Date Taking? Authorizing Provider  amLODipine (NORVASC) 10 MG tablet Take 1 tablet (10 mg total) by mouth daily. 04/13/18   Fayrene Helper, MD  aspirin 81 MG tablet Take 81 mg by mouth daily.      [provider]  benazepril (LOTENSIN) 40 MG tablet Take 1 tablet (40 mg total) by mouth daily. 04/13/18   Fayrene Helper, MD  Calcium Carbonate-Vitamin D (CALCIUM 600+D) 600-400  MG-UNIT per tablet Take 1 tablet by mouth daily.      [provider]  ergocalciferol (VITAMIN D2) 1.25 MG (50000 UT) capsule Take 1 capsule (50,000 Units total) by mouth once a week. One capsule once weekly 10/02/18   Fayrene Helper, MD  hydrochlorothiazide (MICROZIDE) 12.5 MG capsule Take 1 capsule (12.5 mg total) by mouth daily. 07/29/18 07/30/19  Fayrene Helper, MD  methocarbamol (ROBAXIN) 500 MG tablet Take 1 tablet (500 mg total) by mouth 2 (two) times daily for 7 days. 10/25/18 11/01/18  Janeece Fitting, PA-C  Multiple Vitamin (MULTIVITAMIN) capsule Take 1 capsule by mouth daily.      [provider]  phentermine (ADIPEX-P) 37.5 MG tablet Take 1 tablet (37.5 mg total) by mouth daily before breakfast. 10/02/18   Fayrene Helper, MD  potassium chloride (K-DUR) 10 MEQ tablet Take 1 tablet (10 mEq total) by mouth daily. 07/17/18   Fayrene Helper, MD    Family History Family History  Problem Relation Age of Onset  . Cancer Mother        breast  . Breast cancer Mother 23  . Diabetes Father   . Stroke Father   . Hypertension Father   . Cancer Brother        liver  . Hypertension Brother   . Colon cancer  Neg Hx   . Esophageal cancer Neg Hx   . Pancreatic cancer Neg Hx   . Rectal cancer Neg Hx   . Stomach cancer Neg Hx     Social History Social History   Tobacco Use  . Smoking status: Never Smoker  . Smokeless tobacco: Never Used  Substance Use Topics  . Alcohol use: No    Alcohol/week: 0.0 standard drinks  . Drug use: No     Allergies   Naproxen; Codeine; and Tramadol   Review of Systems Review of Systems  Constitutional: Negative for fever.  Musculoskeletal: Positive for back pain and myalgias.  Neurological: Negative for weakness.     Physical Exam Updated Vital Signs BP 122/74   Pulse 80   Temp 97.8 F (36.6 C) (Oral)   Resp 14   SpO2 100%   Physical Exam Vitals signs and nursing note reviewed.  Constitutional:       General: She is not in acute distress.    Appearance: She is well-developed.  HENT:     Head: Normocephalic and atraumatic.     Mouth/Throat:     Pharynx: No oropharyngeal exudate.  Eyes:     Pupils: Pupils are equal, round, and reactive to light.  Neck:     Musculoskeletal: Normal range of motion.  Cardiovascular:     Rate and Rhythm: Regular rhythm.     Heart sounds: Normal heart sounds.  Pulmonary:     Effort: Pulmonary effort is normal. No respiratory distress.     Breath sounds: Normal breath sounds.  Abdominal:     General: Bowel sounds are normal. There is no distension.     Palpations: Abdomen is soft.     Tenderness: There is no abdominal tenderness.  Musculoskeletal:        General: No tenderness or deformity.       Back:     Right lower leg: No edema.     Left lower leg: No edema.  Skin:    General: Skin is warm and dry.  Neurological:     Mental Status: She is alert and oriented to person, place, and time.      ED Treatments / Results  Labs (all labs ordered are listed, but only abnormal results are displayed) Labs Reviewed  URINALYSIS, ROUTINE W REFLEX MICROSCOPIC - Abnormal; Notable for the following components:      Result Value   APPearance CLOUDY (*)    All other components within normal limits  COMPREHENSIVE METABOLIC PANEL - Abnormal; Notable for the following components:   Potassium 3.4 (*)    Glucose, Bld 112 (*)    All other components within normal limits  CBC WITH DIFFERENTIAL/PLATELET  LIPASE, BLOOD    EKG None  Radiology Dg Lumbar Spine 2-3 Views  Result Date: 10/25/2018 CLINICAL DATA:  Pt c/o left-sided chest pain x 1 day; no injury to the area. No hx of prior injuries or surgeries.pain EXAM: LUMBAR SPINE - 2-3 VIEW COMPARISON:  Lumbar spine MRI 09/16/2011 FINDINGS: Normal alignment of lumbar vertebral bodies. No loss of vertebral body height or disc height. No pars fracture. No subluxation. IMPRESSION: No acute osseous abnormality.  Electronically Signed   By: Suzy Bouchard M.D.   On: 10/25/2018 18:35    Procedures Procedures (including critical care time)  Medications Ordered in ED Medications  diazepam (VALIUM) tablet 2 mg (2 mg Oral Given 10/25/18 1924)     Initial Impression / Assessment and Plan / ED Course  I have  reviewed the triage vital signs and the nursing notes.  Pertinent labs & imaging results that were available during my care of the patient were reviewed by me and considered in my medical decision making (see chart for details).    Presents with back pain which began this morning, no urinary complaints at this time.  Patient reports this pain is worse with movement and changing positions.  At this time patient will have blood work obtained as this is patient's only visit for back pain in the emergency department.  All blood work was unremarkable, UA showed no signs of infection.  Patient's pain has improved with Valium.  DG lumbar obtained with no acute process or abnormality.  This time will discharge patient with muscle relaxers to help with her pain.  She is encouraged to follow-up with her PCP as needed.  Patient reports she has a surgeon's appointment on Tuesday and will follow-up with her PCP next week.  No red flags or bowel or bladder incontinence.  Return precautions provided.  Final Clinical Impressions(s) / ED Diagnoses   Final diagnoses:  Acute right-sided low back pain with sciatica, sciatica laterality unspecified    ED Discharge Orders         Ordered    methocarbamol (ROBAXIN) 500 MG tablet  2 times daily     10/25/18 2025           Janeece Fitting, Hershal Coria 10/25/18 2043    Isla Pence, MD 10/26/18 1104

## 2018-10-25 NOTE — ED Triage Notes (Signed)
Pt reports left lower back pain that started this morning at 830. Pt denies radiation to leg or changes in bowel or bladder function. No burning or frequency with urination. Pain worse with movement

## 2018-10-25 NOTE — ED Triage Notes (Addendum)
Pt reports left lower back pain that started this morning at 830. Pt denies radiation to leg or changes in bowel or bladder function. No burning or frequency with urination. Pain worse with movement

## 2018-10-25 NOTE — Discharge Instructions (Signed)
I have prescribed muscle relaxers for your pain, please do not drink or drive while taking this medications as they can make you drowsy.  Please follow-up with PCP in 1 week for reevaluation of your symptoms.  You experience any bowel or bladder incontinence, fever, worsening in your symptoms please return to the ED. ° °

## 2018-10-31 ENCOUNTER — Ambulatory Visit (INDEPENDENT_AMBULATORY_CARE_PROVIDER_SITE_OTHER): Payer: BLUE CROSS/BLUE SHIELD | Admitting: Family Medicine

## 2018-10-31 ENCOUNTER — Encounter: Payer: Self-pay | Admitting: Family Medicine

## 2018-10-31 VITALS — BP 114/74 | HR 96 | Resp 15 | Ht 61.0 in | Wt 221.0 lb

## 2018-10-31 DIAGNOSIS — M5441 Lumbago with sciatica, right side: Secondary | ICD-10-CM

## 2018-10-31 DIAGNOSIS — I10 Essential (primary) hypertension: Secondary | ICD-10-CM

## 2018-10-31 DIAGNOSIS — Z09 Encounter for follow-up examination after completed treatment for conditions other than malignant neoplasm: Secondary | ICD-10-CM | POA: Diagnosis not present

## 2018-10-31 MED ORDER — METHYLPREDNISOLONE ACETATE 80 MG/ML IJ SUSP
80.0000 mg | Freq: Once | INTRAMUSCULAR | Status: AC
Start: 2018-10-31 — End: 2018-10-31
  Administered 2018-10-31: 80 mg via INTRAMUSCULAR

## 2018-10-31 MED ORDER — PREDNISONE 5 MG (21) PO TBPK: 5.0000 mg | ORAL_TABLET | ORAL | 0 refills | Status: DC

## 2018-10-31 MED ORDER — FAMOTIDINE 40 MG PO TABS: 40.0000 mg | ORAL_TABLET | Freq: Every day | ORAL | 0 refills | Status: DC

## 2018-10-31 NOTE — Patient Instructions (Signed)
F/U as before, call if you need me sooner  You had a flare of sciatica, Thankful you are improved significantly   Continue robaxin as before and prednisone is prescribed for 6 days along with Pepcid for the next 2 weeks  Depo Medrol 80  Mg IM in office today for sciatic pain  Careful with back  Thank you  for choosing [facility]. We consider it a privelige to serve you.  Delivering excellent health care in a caring and  compassionate way is our goal.  Partnering with you,  so that together we can achieve this goal is our strategy.

## 2018-11-01 ENCOUNTER — Encounter: Payer: Self-pay | Admitting: Family Medicine

## 2018-11-01 DIAGNOSIS — Z09 Encounter for follow-up examination after completed treatment for conditions other than malignant neoplasm: Secondary | ICD-10-CM | POA: Insufficient documentation

## 2018-11-01 DIAGNOSIS — M5441 Lumbago with sciatica, right side: Secondary | ICD-10-CM | POA: Insufficient documentation

## 2018-11-01 NOTE — Assessment & Plan Note (Signed)
Improved but flare persists, she is to continue robaxin prescribed, in ED depo medrol 80 mg Im followed by short course of prednisone

## 2018-11-01 NOTE — Progress Notes (Signed)
   Megan Morales     MRN: [id number]      DOB: [birth year]   HPI Megan Morales is here for follow up of Ed visit 6 days ago with acute sciatic downright lower ext, improved , but still has pain and discomfort up to a6 . Has had in the past, has established arthritis and disc disease Had recent negative breast biopsy, and states has decided against breast surgery ROS Denies recent fever or chills. Denies sinus pressure, nasal congestion, ear pain or sore throat. Denies chest congestion, productive cough or wheezing. Denies chest pains, palpitations and leg swelling Denies abdominal pain, nausea, vomiting,diarrhea or constipation.   Denies dysuria, frequency, hesitancy or incontinence. . Denies depression, anxiety or insomnia. Denies skin break down or rash.   PE  BP 114/74   Pulse 96   Resp 15   Ht 5\' 1"  (1.549 m)   Wt 221 lb (100.2 kg)   SpO2 97%   BMI 41.76 kg/m   Patient alert and oriented and in no cardiopulmonary distress.  HEENT: No facial asymmetry, EOMI,   oropharynx pink and moist.  Neck supple no JVD, no mass.  Chest: Clear to auscultation bilaterally.  CVS: S1, S2 no murmurs, no S3.Regular rate.  ABD: Soft non tender.   Ext: No edema  MS: Decreased ROM lumbar  Spine,adequate in  shoulders, hips and knees.  Skin: Intact, no ulcerations or rash noted.  Psych: Good eye contact, normal affect. Memory intact not anxious or depressed appearing.  CNS: CN 2-12 intact, power,  normal throughout.no focal deficits noted.   Assessment & Plan  Acute back pain with sciatica, right Improved but flare persists, she is to continue robaxin prescribed, in ED depo medrol 80 mg Im followed by short course of prednisone  ESSENTIAL HYPERTENSION, BENIGN Controlled, no change in medication DASH diet and commitment to daily physical activity for a minimum of 30 minutes discussed and encouraged, as a part of hypertension management. The importance of attaining a healthy weight  is also discussed.  BP/Weight 10/31/2018 10/25/2018 10/02/2018 07/17/2018 03/15/2018 01/01/2018 62/13/0865  Systolic BP 784 696 295 284 132 440 102  Diastolic BP 74 74 80 82 70 88 84  Wt. (Lbs) 221 - 221 227.12 227 228 226  BMI 41.76 - 41.76 42.91 42.89 43.08 42.7       Morbid obesity ([facility]) Unchanged. Patient re-educated about  the importance of commitment to a  minimum of 150 minutes of exercise per week.  The importance of healthy food choices with portion control discussed. Encouraged to start a food diary, count calories and to consider  joining a support group. Sample diet sheets offered. Goals set by the patient for the next several months.   Weight /BMI 10/31/2018 10/02/2018 07/17/2018  WEIGHT 221 lb 221 lb 227 lb 1.9 oz  HEIGHT 5\' 1"  5\' 1"  5\' 1"   BMI 41.76 kg/m2 41.76 kg/m2 42.91 kg/m2      Encounter for examination following treatment at hospital Patient in for follow up of recent Ed visit Discharge summary, and laboratory and radiology data are reviewed, and any questions or concerns  are discussed. Specific issues requiring follow up are specifically addressed.

## 2018-11-01 NOTE — Assessment & Plan Note (Signed)
Patient in for follow up of recent Ed visit Discharge summary, and laboratory and radiology data are reviewed, and any questions or concerns  are discussed. Specific issues requiring follow up are specifically addressed.  

## 2018-11-01 NOTE — Assessment & Plan Note (Signed)
Unchanged. Patient re-educated about  the importance of commitment to a  minimum of 150 minutes of exercise per week.  The importance of healthy food choices with portion control discussed. Encouraged to start a food diary, count calories and to consider  joining a support group. Sample diet sheets offered. Goals set by the patient for the next several months.   Weight /BMI 10/31/2018 10/02/2018 07/17/2018  WEIGHT 221 lb 221 lb 227 lb 1.9 oz  HEIGHT 5\' 1"  5\' 1"  5\' 1"   BMI 41.76 kg/m2 41.76 kg/m2 42.91 kg/m2

## 2018-11-01 NOTE — Assessment & Plan Note (Signed)
Controlled, no change in medication DASH diet and commitment to daily physical activity for a minimum of 30 minutes discussed and encouraged, as a part of hypertension management. The importance of attaining a healthy weight is also discussed.  BP/Weight 10/31/2018 10/25/2018 10/02/2018 07/17/2018 03/15/2018 01/01/2018 82/50/5397  Systolic BP 673 419 379 024 097 353 299  Diastolic BP 74 74 80 82 70 88 84  Wt. (Lbs) 221 - 221 227.12 227 228 226  BMI 41.76 - 41.76 42.91 42.89 43.08 42.7

## 2019-01-31 ENCOUNTER — Encounter: Payer: Self-pay | Admitting: Family Medicine

## 2019-01-31 ENCOUNTER — Other Ambulatory Visit: Payer: Self-pay

## 2019-01-31 ENCOUNTER — Ambulatory Visit (INDEPENDENT_AMBULATORY_CARE_PROVIDER_SITE_OTHER): Payer: BLUE CROSS/BLUE SHIELD | Admitting: Family Medicine

## 2019-01-31 VITALS — BP 114/74 | Ht 61.0 in | Wt 221.0 lb

## 2019-01-31 DIAGNOSIS — E559 Vitamin D deficiency, unspecified: Secondary | ICD-10-CM

## 2019-01-31 DIAGNOSIS — R7303 Prediabetes: Secondary | ICD-10-CM

## 2019-01-31 DIAGNOSIS — M722 Plantar fascial fibromatosis: Secondary | ICD-10-CM | POA: Diagnosis not present

## 2019-01-31 DIAGNOSIS — R7301 Impaired fasting glucose: Secondary | ICD-10-CM | POA: Diagnosis not present

## 2019-01-31 DIAGNOSIS — I10 Essential (primary) hypertension: Secondary | ICD-10-CM | POA: Diagnosis not present

## 2019-01-31 MED ORDER — HYDROCHLOROTHIAZIDE 12.5 MG PO CAPS: 12.5000 mg | ORAL_CAPSULE | Freq: Every day | ORAL | 1 refills | Status: DC

## 2019-01-31 MED ORDER — POTASSIUM CHLORIDE ER 10 MEQ PO TBCR: 10.0000 meq | EXTENDED_RELEASE_TABLET | Freq: Every day | ORAL | 1 refills | Status: DC

## 2019-01-31 MED ORDER — PHENTERMINE HCL 37.5 MG PO TABS: 37.5000 mg | ORAL_TABLET | Freq: Every day | ORAL | 1 refills | Status: DC

## 2019-01-31 MED ORDER — BENAZEPRIL HCL 40 MG PO TABS: 40.0000 mg | ORAL_TABLET | Freq: Every day | ORAL | 1 refills | Status: DC

## 2019-01-31 MED ORDER — AMLODIPINE BESYLATE 10 MG PO TABS: 10.0000 mg | ORAL_TABLET | Freq: Every day | ORAL | 1 refills | Status: DC

## 2019-01-31 MED ORDER — PREDNISONE 5 MG (21) PO TBPK: 5.0000 mg | ORAL_TABLET | ORAL | 0 refills | Status: DC

## 2019-01-31 MED ORDER — ERGOCALCIFEROL 1.25 MG (50000 UT) PO CAPS: 50000.0000 [IU] | ORAL_CAPSULE | ORAL | 1 refills | Status: DC

## 2019-01-31 NOTE — Patient Instructions (Addendum)
F/u with MD early August , call if you need me sooner  Nurse visit for shingrix #2 next Friday morning preferred by pt, please arrange  Please get HBA1C, chem 7 and EGFR 1 week before next visit  Prednisone is prescribe d for 6 days for bilateral foot pain which I believe is plantar fasciitis  Please roll feet on bottles of ice at bedtime to help with the inflammation, also do stretches of the feet , see handout   Please call and check on date for next mammogram  I think in December but very important you get this schgeduled on time and keep the appointment )  Social distancing. Frequent hand washing with soap and water Keeping your hands off of your face.Wear a mask and maintain 6 ft distance outside of your home These 3 practices will help to keep both you and your community healthy during this time. Please practice them faithfully!     Plantar Fasciitis  Plantar fasciitis is a painful foot condition that affects the heel. It occurs when the band of tissue that connects the toes to the heel bone (plantar fascia) becomes irritated. This can happen as the result of exercising too much or doing other repetitive activities (overuse injury). The pain from plantar fasciitis can range from mild irritation to severe pain that makes it difficult to walk or move. The pain is usually worse in the morning after sleeping, or after sitting or lying down for a while. Pain may also be worse after long periods of walking or standing. What are the causes? This condition may be caused by:  Standing for long periods of time.  Wearing shoes that do not have good arch support.  Doing activities that put stress on joints (high-impact activities), including running, aerobics, and ballet.  Being overweight.  An abnormal way of walking (gait).  Tight muscles in the back of your lower leg (calf).  High arches in your feet.  Starting a new athletic activity. What are the signs or symptoms? The main  symptom of this condition is heel pain. Pain may:  Be worse with first steps after a time of rest, especially in the morning after sleeping or after you have been sitting or lying down for a while.  Be worse after long periods of standing still.  Decrease after 30-45 minutes of activity, such as gentle walking. How is this diagnosed? This condition may be diagnosed based on your medical history and your symptoms. Your health care provider may ask questions about your activity level. Your health care provider will do a physical exam to check for:  A tender area on the bottom of your foot.  A high arch in your foot.  Pain when you move your foot.  Difficulty moving your foot. You may have imaging tests to confirm the diagnosis, such as:  X-rays.  Ultrasound.  MRI. How is this treated? Treatment for plantar fasciitis depends on how severe your condition is. Treatment may include:  Rest, ice, applying pressure (compression), and raising the affected foot (elevation). This may be called RICE therapy. Your health care provider may recommend RICE therapy along with over-the-counter pain medicines to manage your pain.  Exercises to stretch your calves and your plantar fascia.  A splint that holds your foot in a stretched, upward position while you sleep (night splint).  Physical therapy to relieve symptoms and prevent problems in the future.  Injections of steroid medicine (cortisone) to relieve pain and inflammation.  Stimulating your plantar  fascia with electrical impulses (extracorporeal shock wave therapy). This is usually the last treatment option before surgery.  Surgery, if other treatments have not worked after 12 months. Follow these instructions at home:  Managing pain, stiffness, and swelling  If directed, put ice on the painful area: ? Put ice in a plastic bag, or use a frozen bottle of water. ? Place a towel between your skin and the bag or bottle. ? Roll the  bottom of your foot over the bag or bottle. ? Do this for 20 minutes, 2-3 times a day.  Wear athletic shoes that have air-sole or gel-sole cushions, or try wearing soft shoe inserts that are designed for plantar fasciitis.  Raise (elevate) your foot above the level of your heart while you are sitting or lying down. Activity  Avoid activities that cause pain. Ask your health care provider what activities are safe for you.  Do physical therapy exercises and stretches as told by your health care provider.  Try activities and forms of exercise that are easier on your joints (low-impact). Examples include swimming, water aerobics, and biking. General instructions  Take over-the-counter and prescription medicines only as told by your health care provider.  Wear a night splint while sleeping, if told by your health care provider. Loosen the splint if your toes tingle, become numb, or turn cold and blue.  Maintain a healthy weight, or work with your health care provider to lose weight as needed.  Keep all follow-up visits as told by your health care provider. This is important. Contact a health care provider if you:  Have symptoms that do not go away after caring for yourself at home.  Have pain that gets worse.  Have pain that affects your ability to move or do your daily activities. Summary  Plantar fasciitis is a painful foot condition that affects the heel. It occurs when the band of tissue that connects the toes to the heel bone (plantar fascia) becomes irritated.  The main symptom of this condition is heel pain that may be worse after exercising too much or standing still for a long time.  Treatment varies, but it usually starts with rest, ice, compression, and elevation (RICE therapy) and over-the-counter medicines to manage pain. This information is not intended to replace advice given to you by your health care provider. Make sure you discuss any questions you have with your  health care provider. Document Released: 06/14/2001 Document Revised: 07/17/2017 Document Reviewed: 07/17/2017 Elsevier Interactive Patient Education  2019 [employer].

## 2019-01-31 NOTE — Progress Notes (Signed)
Virtual Visit via Telephone Note  I connected with Megan Morales on 01/31/19 at 10:40 AM EDT by telephone and verified that I am speaking with the correct person using two identifiers.  Location: Patient: at work  Provider: office    I discussed the limitations, risks, security and privacy concerns of performing an evaluation and management service by telephone and the availability of in person appointments. I also discussed with the patient that there may be a patient responsible charge related to this service. The patient expressed understanding and agreed to proceed.   History of Present Illness: 2 day h/o bilateral foot pain has had this in the past , pain is mainly in the instep radiaiting to heeel Denies recent fever or chills. Denies sinus pressure, nasal congestion, ear pain or sore throat. Denies chest congestion, productive cough or wheezing. Denies chest pains, palpitations and leg swelling Denies abdominal pain, nausea, vomiting,diarrhea or constipation.   Denies dysuria, frequency, hesitancy or incontinence.  Denies headaches, seizures, numbness, or tingling. Denies depression, anxiety or insomnia. Denies skin break down or rash. Inconsistent exercise and dietary change with no significant improvement in weight        Observations/Objective: BP 114/74   Ht 5\' 1"  (1.549 m)   Wt 221 lb (100.2 kg)   BMI 41.76 kg/m  Good communication with no confusion and intact memory. Alert and oriented x 3 No signs of respiratory distress during sppech    Assessment and Plan: Plantar fasciitis 2 dayu h/o pain no trauma, prednisone prescribed with pt ed  Morbid obesity ([place]) Deteriorated.  Patient re-educated about  the importance of commitment to a  minimum of 150 minutes of exercise per week as able.  The importance of healthy food choices with portion control discussed, as well as eating regularly and within a 12 hour window most days. The need to choose "clean ,  green" food 50 to 75% of the time is discussed, as well as to make water the primary drink and set a goal of 64 ounces water daily.  Encouraged to start a food diary,  and to consider  joining a support group. Sample diet sheets offered. Goals set by the patient for the next several months.   Weight /BMI 01/31/2019 10/31/2018 10/02/2018  WEIGHT 221 lb 221 lb 221 lb  HEIGHT 5\' 1"  5\' 1"  5\' 1"   BMI 41.76 kg/m2 41.76 kg/m2 41.76 kg/m2      ESSENTIAL HYPERTENSION, BENIGN Controlled, no change in medication DASH diet and commitment to daily physical activity for a minimum of 30 minutes discussed and encouraged, as a part of hypertension management. The importance of attaining a healthy weight is also discussed.  BP/Weight 01/31/2019 10/31/2018 10/25/2018 10/02/2018 07/17/2018 1/61/0960 01/05/4097  Systolic BP 119 147 829 562 130 865 784  Diastolic BP 74 74 74 80 82 70 88  Wt. (Lbs) 221 221 - 221 227.12 227 228  BMI 41.76 41.76 - 41.76 42.91 42.89 43.08       Prediabetes Patient educated about the importance of limiting  Carbohydrate intake , the need to commit to daily physical activity for a minimum of 30 minutes , and to commit weight loss. The fact that changes in all these areas will reduce or eliminate all together the development of diabetes is stressed.  Updated lab needed at/ before next visit.   Diabetic Labs Latest Ref Rng & Units 10/25/2018 09/28/2018 09/07/2017 07/13/2016 02/16/2016  HbA1c <5.7 % of total Hgb - 5.8(H) 5.8(H) 5.5 5.9(H)  Chol <  200 mg/dL - 180 - 173 -  HDL >50 mg/dL - 49(L) - 56 -  Calc LDL mg/dL (calc) - 116(H) - 106 -  Triglycerides <150 mg/dL - 60 - 53 -  Creatinine 0.44 - 1.00 mg/dL 0.85 1.10(H) 0.83 0.89 0.89   BP/Weight 01/31/2019 10/31/2018 10/25/2018 10/02/2018 07/17/2018 8/76/8115 04/03/6202  Systolic BP 559 741 638 453 646 803 212  Diastolic BP 74 74 74 80 82 70 88  Wt. (Lbs) 221 221 - 221 227.12 227 228  BMI 41.76 41.76 - 41.76 42.91 42.89 43.08   No  flowsheet data found.      Follow Up Instructions:    I discussed the assessment and treatment plan with the patient. The patient was provided an opportunity to ask questions and all were answered. The patient agreed with the plan and demonstrated an understanding of the instructions.   The patient was advised to call back or seek an in-person evaluation if the symptoms worsen or if the condition fails to improve as anticipated.  I provided 22 minutes of non-face-to-face time during this encounter.   Tula Nakayama, MD

## 2019-01-31 NOTE — Assessment & Plan Note (Addendum)
2 dayu h/o pain no trauma, prednisone prescribed with pt ed

## 2019-02-03 NOTE — Assessment & Plan Note (Signed)
Controlled, no change in medication DASH diet and commitment to daily physical activity for a minimum of 30 minutes discussed and encouraged, as a part of hypertension management. The importance of attaining a healthy weight is also discussed.  BP/Weight 01/31/2019 10/31/2018 10/25/2018 10/02/2018 07/17/2018 9/93/5701 04/08/9389  Systolic BP 300 923 300 762 263 335 456  Diastolic BP 74 74 74 80 82 70 88  Wt. (Lbs) 221 221 - 221 227.12 227 228  BMI 41.76 41.76 - 41.76 42.91 42.89 43.08

## 2019-02-03 NOTE — Assessment & Plan Note (Signed)
Patient educated about the importance of limiting  Carbohydrate intake , the need to commit to daily physical activity for a minimum of 30 minutes , and to commit weight loss. The fact that changes in all these areas will reduce or eliminate all together the development of diabetes is stressed.  Updated lab needed at/ before next visit.   Diabetic Labs Latest Ref Rng & Units 10/25/2018 09/28/2018 09/07/2017 07/13/2016 02/16/2016  HbA1c <5.7 % of total Hgb - 5.8(H) 5.8(H) 5.5 5.9(H)  Chol <200 mg/dL - 180 - 173 -  HDL >50 mg/dL - 49(L) - 56 -  Calc LDL mg/dL (calc) - 116(H) - 106 -  Triglycerides <150 mg/dL - 60 - 53 -  Creatinine 0.44 - 1.00 mg/dL 0.85 1.10(H) 0.83 0.89 0.89   BP/Weight 01/31/2019 10/31/2018 10/25/2018 10/02/2018 07/17/2018 7/35/6701 01/01/300  Systolic BP 314 388 875 797 282 060 156  Diastolic BP 74 74 74 80 82 70 88  Wt. (Lbs) 221 221 - 221 227.12 227 228  BMI 41.76 41.76 - 41.76 42.91 42.89 43.08   No flowsheet data found.

## 2019-02-03 NOTE — Assessment & Plan Note (Signed)
Deteriorated.  Patient re-educated about  the importance of commitment to a  minimum of 150 minutes of exercise per week as able.  The importance of healthy food choices with portion control discussed, as well as eating regularly and within a 12 hour window most days. The need to choose "clean , green" food 50 to 75% of the time is discussed, as well as to make water the primary drink and set a goal of 64 ounces water daily.  Encouraged to start a food diary,  and to consider  joining a support group. Sample diet sheets offered. Goals set by the patient for the next several months.   Weight /BMI 01/31/2019 10/31/2018 10/02/2018  WEIGHT 221 lb 221 lb 221 lb  HEIGHT 5\' 1"  5\' 1"  5\' 1"   BMI 41.76 kg/m2 41.76 kg/m2 41.76 kg/m2

## 2019-02-11 ENCOUNTER — Other Ambulatory Visit: Payer: Self-pay

## 2019-02-11 ENCOUNTER — Ambulatory Visit (INDEPENDENT_AMBULATORY_CARE_PROVIDER_SITE_OTHER): Payer: BLUE CROSS/BLUE SHIELD

## 2019-02-11 DIAGNOSIS — Z23 Encounter for immunization: Secondary | ICD-10-CM

## 2019-05-07 ENCOUNTER — Ambulatory Visit: Payer: BLUE CROSS/BLUE SHIELD | Admitting: Family Medicine

## 2019-05-07 ENCOUNTER — Other Ambulatory Visit: Payer: Self-pay

## 2019-05-07 ENCOUNTER — Encounter: Payer: Self-pay | Admitting: Family Medicine

## 2019-05-07 VITALS — BP 108/78 | HR 87 | Temp 98.0°F | Resp 15 | Ht 61.0 in | Wt 226.0 lb

## 2019-05-07 DIAGNOSIS — M722 Plantar fascial fibromatosis: Secondary | ICD-10-CM

## 2019-05-07 DIAGNOSIS — E559 Vitamin D deficiency, unspecified: Secondary | ICD-10-CM

## 2019-05-07 DIAGNOSIS — Z1231 Encounter for screening mammogram for malignant neoplasm of breast: Secondary | ICD-10-CM | POA: Diagnosis not present

## 2019-05-07 DIAGNOSIS — R7303 Prediabetes: Secondary | ICD-10-CM | POA: Diagnosis not present

## 2019-05-07 DIAGNOSIS — Z13220 Encounter for screening for lipoid disorders: Secondary | ICD-10-CM | POA: Diagnosis not present

## 2019-05-07 DIAGNOSIS — I10 Essential (primary) hypertension: Secondary | ICD-10-CM

## 2019-05-07 MED ORDER — PHENTERMINE HCL 37.5 MG PO TABS: 37.5000 mg | ORAL_TABLET | Freq: Every day | ORAL | 1 refills | Status: DC

## 2019-05-07 NOTE — Patient Instructions (Signed)
Annual exam with MD first week in jan, call if you need me before Pls sched mammogram verify due date also please Labs today, lipid, chem 7, HBA1C, Vit D  Continue half phentermine dauily  Start food diary  You are referred to  Podiatry  It is important that you exercise regularly at least 30 minutes 5 times a week. If you develop chest pain, have severe difficulty breathing, or feel very tired, stop exercising immediately and seek medical attention   Think about what you will eat, plan ahead. Choose " clean, green, fresh or frozen" over canned, processed or packaged foods which are more sugary, salty and fatty. 70 to 75% of food eaten should be vegetables and fruit. Three meals at set times with snacks allowed between meals, but they must be fruit or vegetables. Aim to eat over a 12 hour period , example 7 am to 7 pm, and STOP after  your last meal of the day. Drink water,generally about 64 ounces per day, no other drink is as healthy. Fruit juice is best enjoyed in a healthy way, by EATING the fruit. Thanks for choosing [facility], we consider it a privelige to serve you.

## 2019-05-07 NOTE — Assessment & Plan Note (Addendum)
No improvement, worsening x 3 months, refer to Podiatry

## 2019-05-07 NOTE — Progress Notes (Signed)
Megan Morales     MRN: [id number]      DOB: 1956/04/03   HPI [name] is here for follow up and re-evaluation of chronic medical conditions, medication management and review of any available recent lab and radiology data.  Preventive health is updated, specifically  Cancer screening and Immunization.    The PT denies any adverse reactions to current medications since the last visit.  3 month h/o worsening bilateral foot and heel pain, does not tolerate weight bearing , has to crawl into bed, painis a 10  ROS Denies recent fever or chills. Denies sinus pressure, nasal congestion, ear pain or sore throat. Denies chest congestion, productive cough or wheezing. Denies chest pains, palpitations and leg swelling Denies abdominal pain, nausea, vomiting,diarrhea or constipation.   Denies dysuria, frequency, hesitancy or incontinence.  Denies headaches, seizures, numbness, or tingling. Denies depression, anxiety or insomnia. Denies skin break down or rash.   PE  BP 108/78   Pulse 87   Temp 98 F (36.7 C) (Temporal)   Resp 15   Ht 5\' 1"  (1.549 m)   Wt 226 lb (102.5 kg)   SpO2 100%   BMI 42.70 kg/m   Patient alert and oriented and in no cardiopulmonary distress.  HEENT: No facial asymmetry, EOMI,   oropharynx pink and moist.  Neck supple no JVD, no mass.  Chest: Clear to auscultation bilaterally.  CVS: S1, S2 no murmurs, no S3.Regular rate.  ABD: Soft non tender.   Ext: No edema  MS: Adequate ROM spine, shoulders, hips and knees. Tender over heels and insteps bilaterally Skin: Intact, no ulcerations or rash noted.  Psych: Good eye contact, normal affect. Memory intact not anxious or depressed appearing.  CNS: CN 2-12 intact, power,  normal throughout.no focal deficits noted.   Assessment & Plan  Plantar fasciitis No improvement, worsening x 3 months, refer to Podiatry  Morbid obesity ([place])  Patient re-educated about  the importance of commitment to a   minimum of 150 minutes of exercise per week as able.  The importance of healthy food choices with portion control discussed, as well as eating regularly and within a 12 hour window most days. The need to choose "clean , green" food 50 to 75% of the time is discussed, as well as to make water the primary drink and set a goal of 64 ounces water daily.    Weight /BMI 05/07/2019 01/31/2019 10/31/2018  WEIGHT 226 lb 221 lb 221 lb  HEIGHT 5\' 1"  5\' 1"  5\' 1"   BMI 42.7 kg/m2 41.76 kg/m2 41.76 kg/m2   Deteriorated, half phentermine daily prescribed with 4 month f/u   Prediabetes Patient educated about the importance of limiting  Carbohydrate intake , the need to commit to daily physical activity for a minimum of 30 minutes , and to commit weight loss. The fact that changes in all these areas will reduce or eliminate all together the development of diabetes is stressed.   Unchanged Diabetic Labs Latest Ref Rng & Units 05/07/2019 10/25/2018 09/28/2018 09/07/2017 07/13/2016  HbA1c <5.7 % of total Hgb 5.8(H) - 5.8(H) 5.8(H) 5.5  Chol <200 mg/dL 187 - 180 - 173  HDL > OR = 50 mg/dL 51 - 49(L) - 56  Calc LDL mg/dL (calc) 120(H) - 116(H) - 106  Triglycerides <150 mg/dL 66 - 60 - 53  Creatinine 0.50 - 0.99 mg/dL 0.98 0.85 1.10(H) 0.83 0.89   BP/Weight 05/07/2019 01/31/2019 10/31/2018 10/25/2018 10/02/2018 07/17/2018 3/54/5625  Systolic BP 638 937  114 122 027 741 287  Diastolic BP 78 74 74 74 80 82 70  Wt. (Lbs) 226 221 221 - 221 227.12 227  BMI 42.7 41.76 41.76 - 41.76 42.91 42.89   No flowsheet data found.    ESSENTIAL HYPERTENSION, BENIGN Controlled, no change in medication DASH diet and commitment to daily physical activity for a minimum of 30 minutes discussed and encouraged, as a part of hypertension management. The importance of attaining a healthy weight is also discussed.  BP/Weight 05/07/2019 01/31/2019 10/31/2018 10/25/2018 10/02/2018 07/17/2018 8/67/6720  Systolic BP 947 096 283 662 118 947 654   Diastolic BP 78 74 74 74 80 82 70  Wt. (Lbs) 226 221 221 - 221 227.12 227  BMI 42.7 41.76 41.76 - 41.76 42.91 42.89

## 2019-05-08 ENCOUNTER — Other Ambulatory Visit: Payer: Self-pay | Admitting: Family Medicine

## 2019-05-08 LAB — BASIC METABOLIC PANEL WITH GFR
BUN: 18 mg/dL (ref 7–25)
CO2: 29 mmol/L (ref 20–32)
Calcium: 9.8 mg/dL (ref 8.6–10.4)
Chloride: 104 mmol/L (ref 98–110)
Creat: 0.98 mg/dL (ref 0.50–0.99)
GFR, Est African American: 72 mL/min/{1.73_m2} (ref >=60)
GFR, Est Non African American: 62 mL/min/{1.73_m2} (ref >=60)
Glucose, Bld: 89 mg/dL (ref 65–99)
Potassium: 4.3 mmol/L (ref 3.5–5.3)
Sodium: 141 mmol/L (ref 135–146)

## 2019-05-08 LAB — HEMOGLOBIN A1C
Hgb A1c MFr Bld: 5.8 % of total Hgb — ABNORMAL HIGH (ref <5.7)
Mean Plasma Glucose: 120 (calc)
eAG (mmol/L): 6.6 (calc)

## 2019-05-08 LAB — LIPID PANEL
Cholesterol: 187 mg/dL (ref <200)
HDL: 51 mg/dL (ref >=50)
LDL Cholesterol (Calc): 120 mg/dL (calc) — ABNORMAL HIGH
Non-HDL Cholesterol (Calc): 136 mg/dL (calc) — ABNORMAL HIGH (ref <130)
Total CHOL/HDL Ratio: 3.7 (calc) (ref <5.0)
Triglycerides: 66 mg/dL (ref <150)

## 2019-05-08 LAB — VITAMIN D 25 HYDROXY (VIT D DEFICIENCY, FRACTURES): Vit D, 25-Hydroxy: 55 ng/mL (ref 30–100)

## 2019-05-13 ENCOUNTER — Ambulatory Visit: Payer: BLUE CROSS/BLUE SHIELD | Admitting: Podiatry

## 2019-05-13 ENCOUNTER — Other Ambulatory Visit: Payer: Self-pay

## 2019-05-13 ENCOUNTER — Encounter: Payer: Self-pay | Admitting: Podiatry

## 2019-05-13 ENCOUNTER — Ambulatory Visit (INDEPENDENT_AMBULATORY_CARE_PROVIDER_SITE_OTHER): Payer: BLUE CROSS/BLUE SHIELD

## 2019-05-13 VITALS — Temp 98.0°F

## 2019-05-13 DIAGNOSIS — M722 Plantar fascial fibromatosis: Secondary | ICD-10-CM

## 2019-05-13 MED ORDER — IBUPROFEN 800 MG PO TABS: 800.0000 mg | ORAL_TABLET | Freq: Three times a day (TID) | ORAL | 0 refills | Status: DC | PRN

## 2019-05-13 NOTE — Patient Instructions (Signed)

## 2019-05-14 NOTE — Progress Notes (Signed)
Subjective:   Patient ID: Megan Morales, female   DOB: 63 y.o.   MRN: [id number]   HPI 63 year old female presents the office with concerns of right heel pain.  This started on March 2020.  She states it hurts and when she first gets up or if she is been sitting for some time and stands back up.  Gets better with activity.  No recent injury or falls.  She feels discomfort in the arch as well as the bottom of the heel.  No radiating pain.  No falls or weakness.   Review of Systems  All other systems reviewed and are negative.  Past Medical History:  Diagnosis Date  . Hypertension     Past Surgical History:  Procedure Laterality Date  . BREAST BIOPSY    . MYOMECTOMY  2010  . TUBAL LIGATION  1992     Current Outpatient Medications:  .  amLODipine (NORVASC) 10 MG tablet, Take 1 tablet (10 mg total) by mouth daily., Disp: 90 tablet, Rfl: 1 .  aspirin 81 MG tablet, Take 81 mg by mouth daily.  , Disp: , Rfl:  .  benazepril (LOTENSIN) 40 MG tablet, Take 1 tablet (40 mg total) by mouth daily., Disp: 90 tablet, Rfl: 1 .  Calcium Carbonate-Vitamin D (CALCIUM 600+D) 600-400 MG-UNIT per tablet, Take 1 tablet by mouth daily.  , Disp: , Rfl:  .  hydrochlorothiazide (MICROZIDE) 12.5 MG capsule, Take 1 capsule (12.5 mg total) by mouth daily., Disp: 90 capsule, Rfl: 1 .  ibuprofen (ADVIL) 800 MG tablet, Take 1 tablet (800 mg total) by mouth every 8 (eight) hours as needed., Disp: 30 tablet, Rfl: 0 .  Multiple Vitamin (MULTIVITAMIN) capsule, Take 1 capsule by mouth daily.  , Disp: , Rfl:  .  phentermine (ADIPEX-P) 37.5 MG tablet, Take 1 tablet (37.5 mg total) by mouth daily before breakfast., Disp: 30 tablet, Rfl: 1 .  potassium chloride (K-DUR) 10 MEQ tablet, Take 1 tablet (10 mEq total) by mouth daily., Disp: 90 tablet, Rfl: 1  Allergies  Allergen Reactions  . Naproxen   . Codeine Swelling  . Tramadol     Weak and lightheaded          Objective:  Physical Exam  General: AAO x3,  NAD  Dermatological: Skin is warm, dry and supple bilateral. Nails x 10 are well manicured; remaining integument appears unremarkable at this time. There are no open sores, no preulcerative lesions, no rash or signs of infection present.  Vascular: Dorsalis Pedis artery and Posterior Tibial artery pedal pulses are 2/4 bilateral with immedate capillary fill time.  There is no pain with calf compression, swelling, warmth, erythema.   Neruologic: Grossly intact via light touch bilateral. Vibratory intact via tuning fork bilateral. Protective threshold with Semmes Wienstein monofilament intact to all pedal sites bilateral.  Negative Tinel sign.  Musculoskeletal: Tenderness to palpation along the plantar medial tubercle of the calcaneus at the insertion of plantar fascia on the right foot. There is no pain along the course of the plantar fascia within the arch of the foot. Plantar fascia appears to be intact. There is no pain with lateral compression of the calcaneus or pain with vibratory sensation. There is no pain along the course or insertion of the achilles tendon. No other areas of tenderness to bilateral lower extremities.Muscular strength 5/5 in all groups tested bilateral.     Assessment:   Right heel pain, plantar fasciitis     Plan:  -Treatment options discussed  including all alternatives, risks, and complications -Etiology of symptoms were discussed -X-rays were obtained and reviewed with the patient. No evidence of acute fracture or stress fracture. -Prescribed ibuprofen and she has done well with this previously. Discussed side effects of the medication and directed to stop if any are to occur and call the office.  -Plantar fascial brace dispensed -Discussed stretching, icing daily -Discussed shoe modifications and orthotics -If no improvement she will do steroid injection next appointment.  Trula Slade DPM

## 2019-05-19 ENCOUNTER — Encounter: Payer: Self-pay | Admitting: Family Medicine

## 2019-05-19 NOTE — Assessment & Plan Note (Signed)
  Patient re-educated about  the importance of commitment to a  minimum of 150 minutes of exercise per week as able.  The importance of healthy food choices with portion control discussed, as well as eating regularly and within a 12 hour window most days. The need to choose "clean , green" food 50 to 75% of the time is discussed, as well as to make water the primary drink and set a goal of 64 ounces water daily.    Weight /BMI 05/07/2019 01/31/2019 10/31/2018  WEIGHT 226 lb 221 lb 221 lb  HEIGHT 5\' 1"  5\' 1"  5\' 1"   BMI 42.7 kg/m2 41.76 kg/m2 41.76 kg/m2   Deteriorated, half phentermine daily prescribed with 4 month f/u

## 2019-05-19 NOTE — Assessment & Plan Note (Signed)
Controlled, no change in medication DASH diet and commitment to daily physical activity for a minimum of 30 minutes discussed and encouraged, as a part of hypertension management. The importance of attaining a healthy weight is also discussed.  BP/Weight 05/07/2019 01/31/2019 10/31/2018 10/25/2018 10/02/2018 07/17/2018 01/31/1483  Systolic BP 039 795 369 223 009 794 997  Diastolic BP 78 74 74 74 80 82 70  Wt. (Lbs) 226 221 221 - 221 227.12 227  BMI 42.7 41.76 41.76 - 41.76 42.91 42.89

## 2019-05-19 NOTE — Assessment & Plan Note (Signed)
Patient educated about the importance of limiting  Carbohydrate intake , the need to commit to daily physical activity for a minimum of 30 minutes , and to commit weight loss. The fact that changes in all these areas will reduce or eliminate all together the development of diabetes is stressed.   Unchanged Diabetic Labs Latest Ref Rng & Units 05/07/2019 10/25/2018 09/28/2018 09/07/2017 07/13/2016  HbA1c <5.7 % of total Hgb 5.8(H) - 5.8(H) 5.8(H) 5.5  Chol <200 mg/dL 187 - 180 - 173  HDL > OR = 50 mg/dL 51 - 49(L) - 56  Calc LDL mg/dL (calc) 120(H) - 116(H) - 106  Triglycerides <150 mg/dL 66 - 60 - 53  Creatinine 0.50 - 0.99 mg/dL 0.98 0.85 1.10(H) 0.83 0.89   BP/Weight 05/07/2019 01/31/2019 10/31/2018 10/25/2018 10/02/2018 07/17/2018 7/61/9509  Systolic BP 326 712 458 099 833 825 053  Diastolic BP 78 74 74 74 80 82 70  Wt. (Lbs) 226 221 221 - 221 227.12 227  BMI 42.7 41.76 41.76 - 41.76 42.91 42.89   No flowsheet data found.

## 2019-06-13 ENCOUNTER — Encounter: Payer: Self-pay | Admitting: Podiatry

## 2019-06-13 ENCOUNTER — Ambulatory Visit: Payer: BLUE CROSS/BLUE SHIELD | Admitting: Podiatry

## 2019-06-13 ENCOUNTER — Other Ambulatory Visit: Payer: Self-pay

## 2019-06-13 DIAGNOSIS — M722 Plantar fascial fibromatosis: Secondary | ICD-10-CM

## 2019-06-13 NOTE — Patient Instructions (Signed)

## 2019-06-19 NOTE — Progress Notes (Signed)
Subjective: 63 year old female presents the office for follow-up evaluation of right heel pain, plan fasciitis.  Overall she states that she is doing much better compliant fascial brace as well as medication has been helpful. No other concerns.Denies any systemic complaints such as fevers, chills, nausea, vomiting. No acute changes since last appointment, and no other complaints at this time.   Objective: AAO x3, NAD DP/PT pulses palpable bilaterally, CRT less than 3 seconds At this time there is no significant discomfort with dorsal insertion of the plantar fascia.  No pain with lateral compression of calcaneus.  No edema no erythema.  Ligamentously intact.  No other areas of tenderness identified. No open lesions or pre-ulcerative lesions.  No pain with calf compression, swelling, warmth, erythema  Assessment: Resolving plantar fasciitis right foot  Plan: -All treatment options discussed with the patient including all alternatives, risks, complications.  -At this time overall she is doing much better.  Encouraged continued stretching, icing daily.  Discussed supportive shoes and orthotics.  Hold off on steroid injection today she is doing well.  At this point we will see her back as needed. -Patient encouraged to call the office with any questions, concerns, change in symptoms.   Trula Slade DPM

## 2019-07-15 ENCOUNTER — Ambulatory Visit (INDEPENDENT_AMBULATORY_CARE_PROVIDER_SITE_OTHER): Payer: BLUE CROSS/BLUE SHIELD

## 2019-07-15 ENCOUNTER — Other Ambulatory Visit: Payer: Self-pay

## 2019-07-15 DIAGNOSIS — Z23 Encounter for immunization: Secondary | ICD-10-CM | POA: Diagnosis not present

## 2019-10-07 ENCOUNTER — Ambulatory Visit
Admission: RE | Admit: 2019-10-07 | Discharge: 2019-10-07 | Disposition: A | Discharge status: Home / Self Care | Payer: BLUE CROSS/BLUE SHIELD | Source: Ambulatory Visit | Attending: Family Medicine | Admitting: Family Medicine

## 2019-10-07 ENCOUNTER — Other Ambulatory Visit: Payer: Self-pay

## 2019-10-07 DIAGNOSIS — Z1231 Encounter for screening mammogram for malignant neoplasm of breast: Secondary | ICD-10-CM

## 2019-10-08 ENCOUNTER — Ambulatory Visit: Payer: BLUE CROSS/BLUE SHIELD | Admitting: Family Medicine

## 2019-10-08 ENCOUNTER — Encounter: Payer: Self-pay | Admitting: Family Medicine

## 2019-10-08 VITALS — BP 120/78 | HR 95 | Temp 98.3°F | Resp 15 | Ht 61.0 in | Wt 228.0 lb

## 2019-10-08 DIAGNOSIS — I10 Essential (primary) hypertension: Secondary | ICD-10-CM

## 2019-10-08 DIAGNOSIS — Z13220 Encounter for screening for lipoid disorders: Secondary | ICD-10-CM | POA: Diagnosis not present

## 2019-10-08 DIAGNOSIS — Z0000 Encounter for general adult medical examination without abnormal findings: Secondary | ICD-10-CM

## 2019-10-08 DIAGNOSIS — R7301 Impaired fasting glucose: Secondary | ICD-10-CM

## 2019-10-08 NOTE — Progress Notes (Signed)
    Megan Morales     MRN: [id number]      DOB: 23-Aug-1956  HPI: Patient is in for annual physical exam.  Recent labs, if available are reviewed. Immunization is reviewed , and  updated if needed.   PE: BP 120/78   Pulse 95   Temp 98.3 F (36.8 C) (Temporal)   Resp 15   Ht 5\' 1"  (1.549 m)   Wt 228 lb (103.4 kg)   SpO2 96%   BMI 43.08 kg/m   Pleasant  female, alert and oriented x 3, in no cardio-pulmonary distress. Afebrile. HEENT No facial trauma or asymetry. Sinuses non tender.  Extra occullar muscles intact.. External ears normal, . Neck: supple, no adenopathy,JVD or thyromegaly.No bruits.  Chest: Clear to ascultation bilaterally.No crackles or wheezes. Non tender to palpation  Breast: No physical exam at visit, normal mammogram on 10/07/2019  Cardiovascular system; Heart sounds normal,  S1 and  S2 ,no S3.  No murmur, or thrill. Apical beat not displaced Peripheral pulses normal.  Abdomen: Soft, non tender, no organomegaly or masses. No bruits. Bowel sounds normal. No guarding, tenderness or rebound.   GU: Asymptomatic, no physical exam in office    Musculoskeletal exam: Full ROM of spine, hips , shoulders and knees. No deformity ,swelling or crepitus noted. No muscle wasting or atrophy.   Neurologic: Cranial nerves 2 to 12 intact. Power, tone ,sensation and reflexes normal throughout. No disturbance in gait. No tremor.  Skin: Intact, no ulceration, erythema , scaling or rash noted. Pigmentation normal throughout  Psych; Normal mood and affect. Judgement and concentration normal   Assessment & Plan:  Annual physical exam Annual exam as documented. Counseling done  re healthy lifestyle involving commitment to 150 minutes exercise per week, heart healthy diet, and attaining healthy weight.The importance of adequate sleep also discussed. Regular seat belt use and home safety, is also discussed. Changes in health habits are decided on by the  patient with goals and time frames  set for achieving them. Immunization and cancer screening needs are specifically addressed at this visit.   Morbid obesity ([name])  Patient re-educated about  the importance of commitment to a  minimum of 150 minutes of exercise per week as able.  The importance of healthy food choices with portion control discussed, as well as eating regularly and within a 12 hour window most days. The need to choose "clean , green" food 50 to 75% of the time is discussed, as well as to make water the primary drink and set a goal of 64 ounces water daily.    Weight /BMI 10/08/2019 05/07/2019 01/31/2019  WEIGHT 228 lb 226 lb 221 lb  HEIGHT 5\' 1"  5\' 1"  5\' 1"   BMI 43.08 kg/m2 42.7 kg/m2 41.76 kg/m2

## 2019-10-08 NOTE — Patient Instructions (Addendum)
F/U in office with MD  Re evaluate weight  And chronic health  Weight loss goal of 10 to 12 pounds   It is important that you exercise regularly at least 30 minutes 5 times a week. If you develop chest pain, have severe difficulty breathing, or feel very tired, stop exercising immediately and seek medical attention    Think about what you will eat, plan ahead. Choose " clean, green, fresh or frozen" over canned, processed or packaged foods which are more sugary, salty and fatty. 70 to 75% of food eaten should be vegetables and fruit. Three meals at set times with snacks allowed between meals, but they must be fruit or vegetables. Aim to eat over a 12 hour period , example 7 am to 7 pm, and STOP after  your last meal of the day. Drink water,generally about 64 ounces per day, no other drink is as healthy. Fruit juice is best enjoyed in a healthy way, by EATING the fruit.  We will provide 1500 calorie diet sheet  Labs fasting , cBC, lipid, cmp and EGFr, tSH, HBA1C  Please return 3 stool cards

## 2019-10-08 NOTE — Assessment & Plan Note (Signed)

## 2019-10-08 NOTE — Assessment & Plan Note (Signed)
  Patient re-educated about  the importance of commitment to a  minimum of 150 minutes of exercise per week as able.  The importance of healthy food choices with portion control discussed, as well as eating regularly and within a 12 hour window most days. The need to choose "clean , green" food 50 to 75% of the time is discussed, as well as to make water the primary drink and set a goal of 64 ounces water daily.    Weight /BMI 10/08/2019 05/07/2019 01/31/2019  WEIGHT 228 lb 226 lb 221 lb  HEIGHT 5\' 1"  5\' 1"  5\' 1"   BMI 43.08 kg/m2 42.7 kg/m2 41.76 kg/m2

## 2019-11-07 DIAGNOSIS — Z23 Encounter for immunization: Secondary | ICD-10-CM | POA: Diagnosis not present

## 2019-12-09 DIAGNOSIS — Z23 Encounter for immunization: Secondary | ICD-10-CM | POA: Diagnosis not present

## 2019-12-23 ENCOUNTER — Encounter (INDEPENDENT_AMBULATORY_CARE_PROVIDER_SITE_OTHER): Payer: Self-pay | Admitting: Internal Medicine

## 2019-12-23 ENCOUNTER — Ambulatory Visit (INDEPENDENT_AMBULATORY_CARE_PROVIDER_SITE_OTHER): Payer: BLUE CROSS/BLUE SHIELD | Admitting: Internal Medicine

## 2019-12-23 ENCOUNTER — Other Ambulatory Visit: Payer: Self-pay

## 2019-12-23 ENCOUNTER — Ambulatory Visit (HOSPITAL_COMMUNITY)
Admission: RE | Admit: 2019-12-23 | Discharge: 2019-12-23 | Disposition: A | Discharge status: Home / Self Care | Payer: BLUE CROSS/BLUE SHIELD | Source: Ambulatory Visit | Attending: Internal Medicine | Admitting: Internal Medicine

## 2019-12-23 ENCOUNTER — Encounter (HOSPITAL_COMMUNITY): Payer: Self-pay

## 2019-12-23 VITALS — BP 125/78 | HR 101 | Temp 97.6°F | Ht 61.0 in | Wt 226.8 lb

## 2019-12-23 DIAGNOSIS — M25562 Pain in left knee: Secondary | ICD-10-CM | POA: Insufficient documentation

## 2019-12-23 MED ORDER — PREDNISONE 20 MG PO TABS: 40.0000 mg | ORAL_TABLET | Freq: Every day | ORAL | 1 refills | Status: DC

## 2019-12-23 NOTE — Progress Notes (Signed)
Metrics: Intervention Frequency ACO  Documented Smoking Status Yearly  Screened one or more times in 24 months  Cessation Counseling or  Active cessation medication Past 24 months  Past 24 months   Guideline developer: UpToDate (See UpToDate for funding source) Date Released: 2014       Wellness Office Visit  Subjective:  Patient ID: Megan Morales, female    DOB: Sep 21, 1956  Age: 64 y.o. MRN: [id number]  CC: Left knee pain HPI  This is a patient of Dr. Syliva Overman who is on leave.  The patient complains of a 2-week history of left knee pain.  She denies any trauma. In her history, she does have a diagnosis of osteoarthritis of both knees.  She said that she has had pain and swelling in the knees previously.  She has been taking prescription ibuprofen 800 mg 3 times a day without any benefit so far.  She denies any fever. She is able to walk although clearly it is somewhat painful for her. Past Medical History:  Diagnosis Date  . Hypertension       Family History  Problem Relation Age of Onset  . Cancer Mother        breast  . Breast cancer Mother 32  . Diabetes Father   . Stroke Father   . Hypertension Father   . Cancer Brother        liver  . Hypertension Brother   . Colon cancer Neg Hx   . Esophageal cancer Neg Hx   . Pancreatic cancer Neg Hx   . Rectal cancer Neg Hx   . Stomach cancer Neg Hx     Social History   Social History Narrative  . Not on file   Social History   Tobacco Use  . Smoking status: Never Smoker  . Smokeless tobacco: Never Used  Substance Use Topics  . Alcohol use: No    Alcohol/week: 0.0 standard drinks    Current Meds  Medication Sig  . amLODipine (NORVASC) 10 MG tablet Take 1 tablet (10 mg total) by mouth daily.  . benazepril (LOTENSIN) 40 MG tablet Take 1 tablet (40 mg total) by mouth daily.  . Calcium Carbonate-Vitamin D (CALCIUM 600+D) 600-400 MG-UNIT per tablet Take 1 tablet by mouth daily.    . hydrochlorothiazide  (MICROZIDE) 12.5 MG capsule Take 1 capsule (12.5 mg total) by mouth daily.  . Multiple Vitamin (MULTIVITAMIN) capsule Take 1 capsule by mouth daily.    . phentermine (ADIPEX-P) 37.5 MG tablet Take 1 tablet (37.5 mg total) by mouth daily before breakfast.  . potassium chloride (K-DUR) 10 MEQ tablet Take 1 tablet (10 mEq total) by mouth daily.        Objective:   Today's Vitals: BP 125/78 (BP Location: Left Arm, Patient Position: Sitting, Cuff Size: Normal)   Pulse (!) 101   Temp 97.6 F (36.4 C) (Temporal)   Ht 5\' 1"  (1.549 m)   Wt 226 lb 12.8 oz (102.9 kg)   SpO2 97%   BMI 42.85 kg/m  Vitals with BMI 12/23/2019 10/08/2019 05/07/2019  Height 5\' 1"  5\' 1"  5\' 1"   Weight 226 lbs 13 oz 228 lbs 226 lbs  BMI 42.88 43.1 42.72  Systolic 125 120 956  Diastolic 78 78 78  Pulse 101 95 87     Physical Exam  She does not appear to be in pain at rest.  The left knee is swollen and somewhat warm.  It is slightly tender medially.  Assessment   1. Acute pain of left knee       Tests ordered Orders Placed This Encounter  Procedures  . DG Knee Complete 4 Views Left     Plan: 1. I suspect she has osteoarthritis with acute exacerbation of the left knee.  Ibuprofen has not helped her to any significant degree at the present time so I will try a short course of prednisone.  She is aware of possible side effects from prednisone. 2. We will also get an x-ray of the left knee to see the severity of osteoarthritis.  She may need orthopedic referral. 3. Further recommendations will depend on this result.   Meds ordered this encounter  Medications  . predniSONE (DELTASONE) 20 MG tablet    Sig: Take 2 tablets (40 mg total) by mouth daily with breakfast.    Dispense:  10 tablet    Refill:  1    Hamish Banks Normajean Glasgow, MD

## 2019-12-27 IMAGING — MG MM CLIP PLACEMENT
2 series · 2 of 2 positions shown · non-contrast
Comparison: Previous exam(s).

CLINICAL DATA: Status post ultrasound-guided core biopsy of a small
9 o'clock, retroareolar intraductal right breast mass.

EXAM:
DIAGNOSTIC RIGHT MAMMOGRAM POST ULTRASOUND BIOPSY

[R CC]
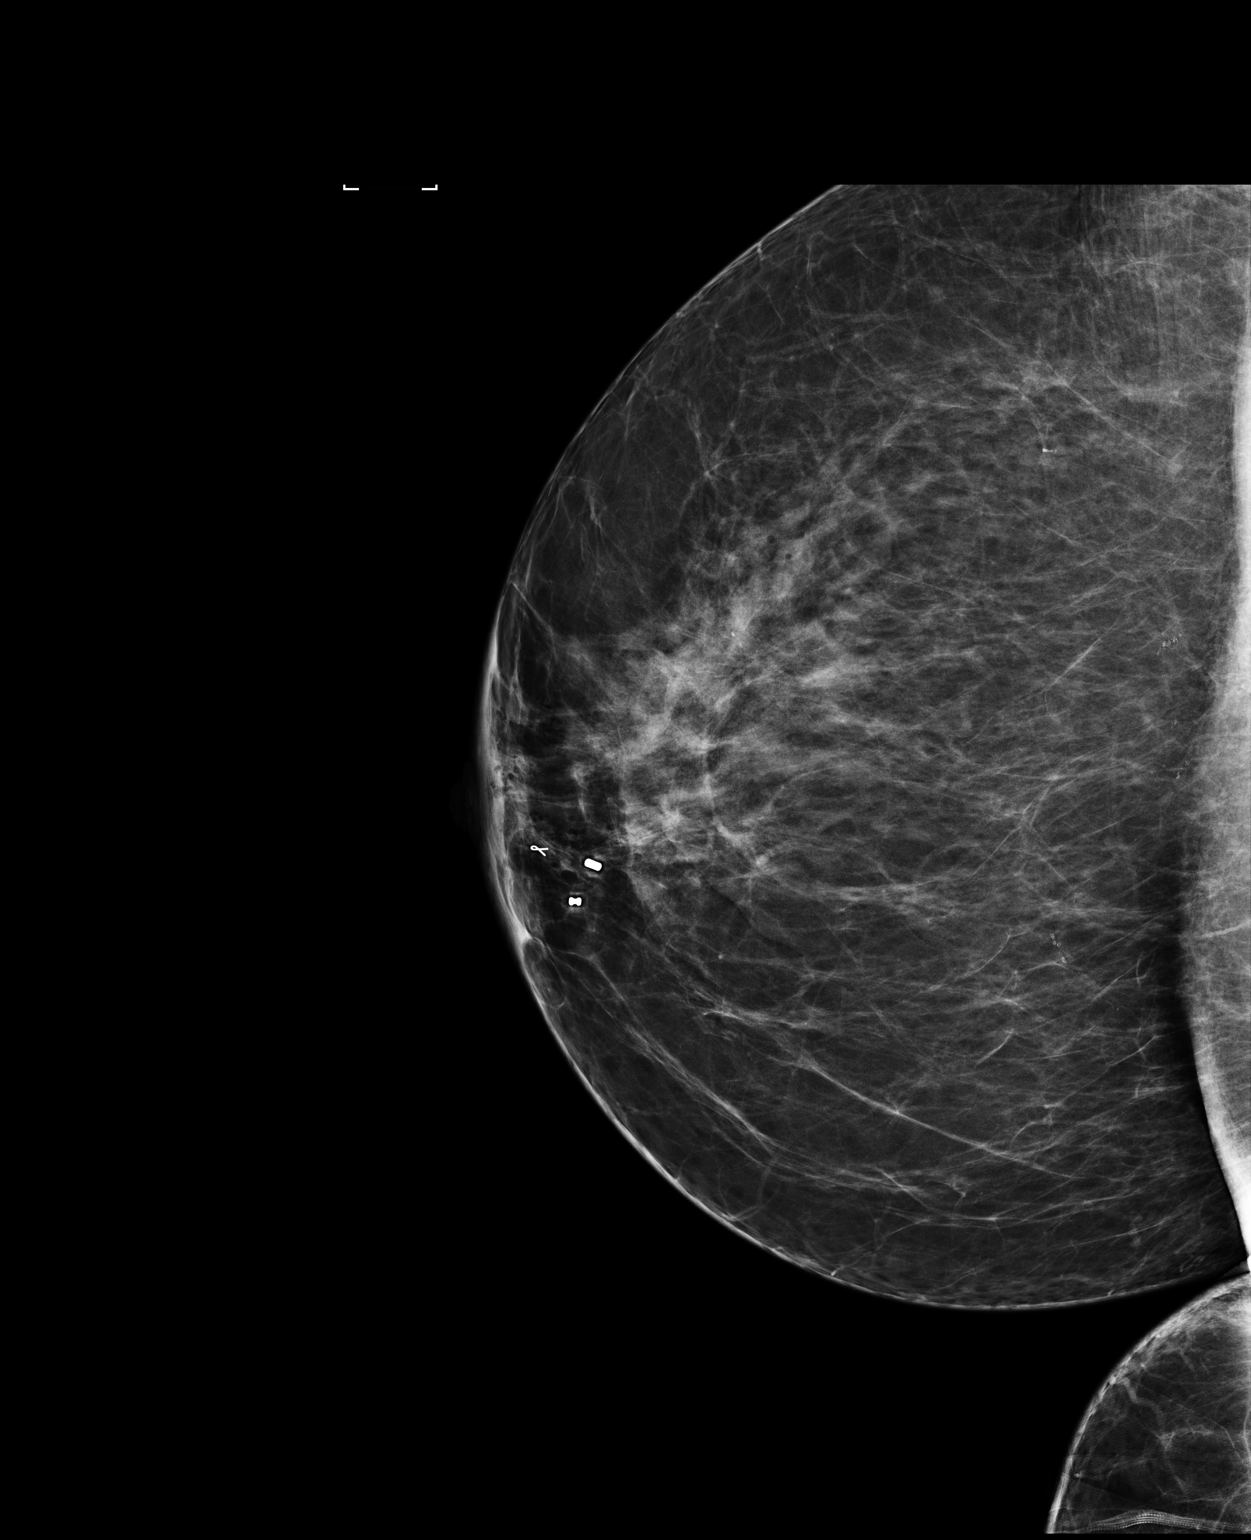

[R ML]
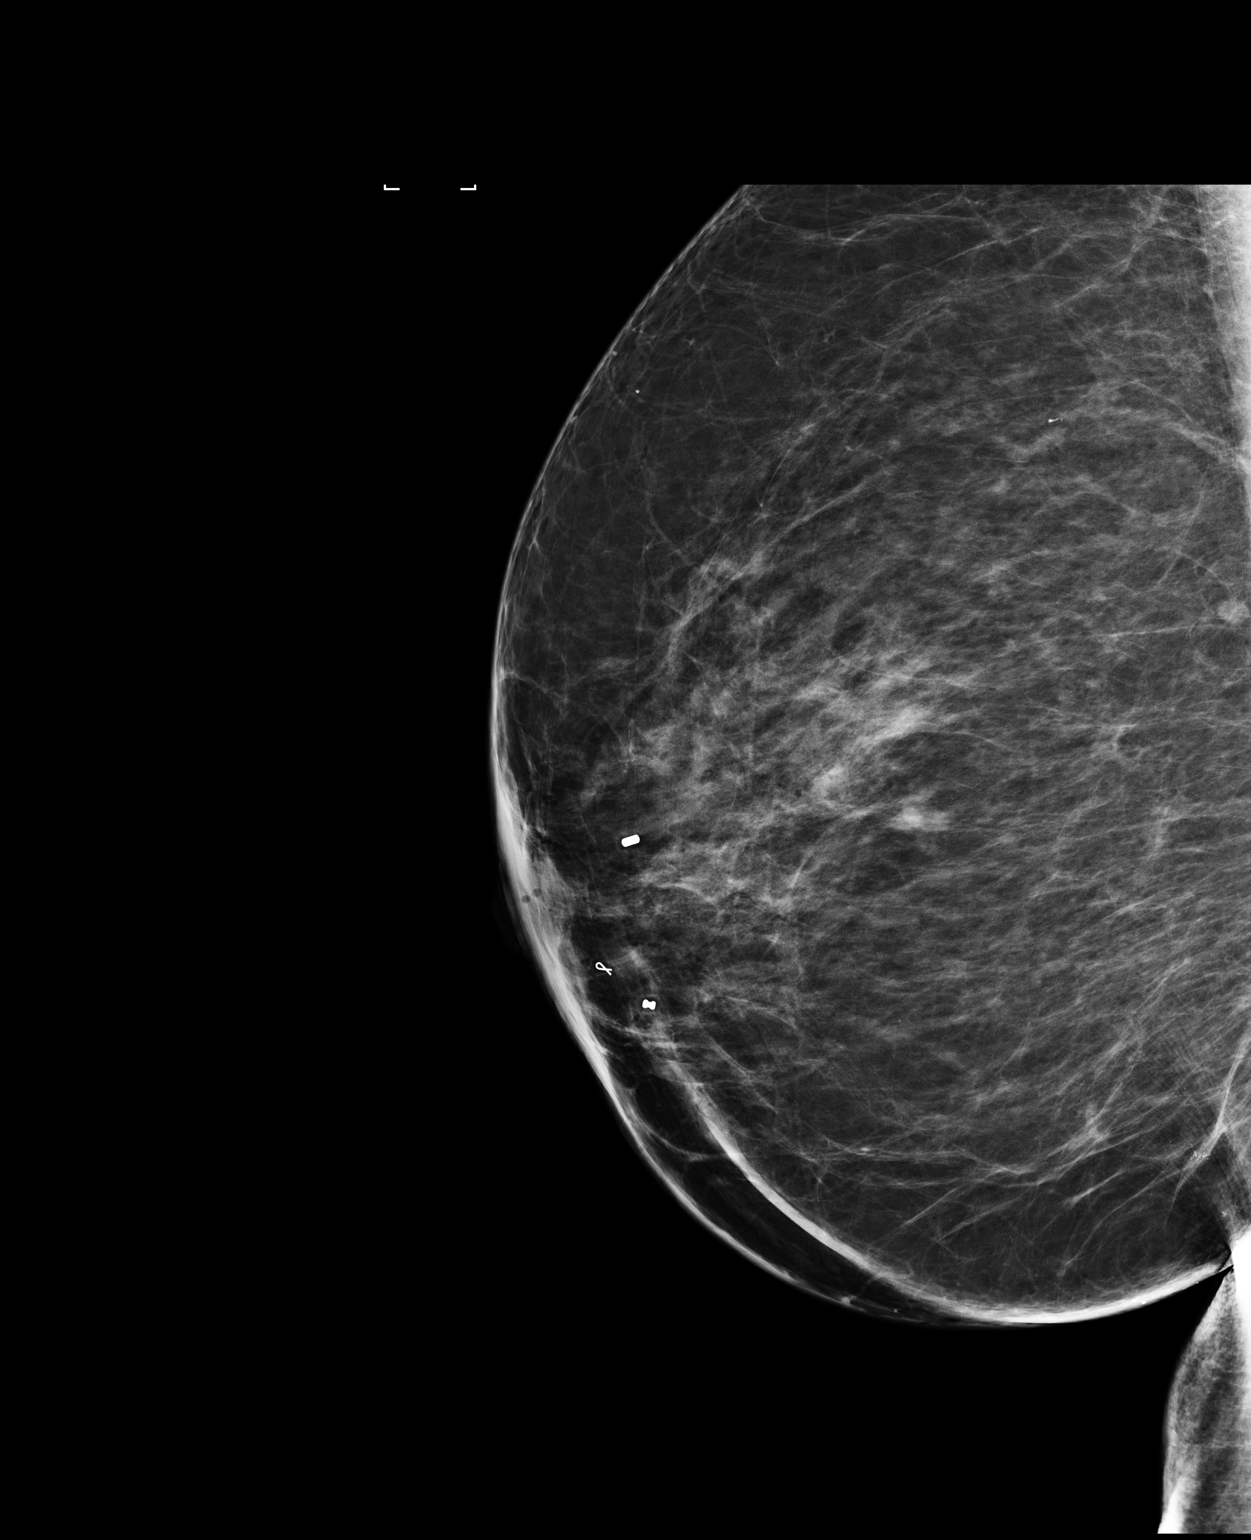

[2 of 2 positions shown; findings below may reference images not displayed]

FINDINGS: Mammographic images were obtained following ultrasound guided biopsy
of a small intraductal right breast mass. The ribbon shaped biopsy
clip lies in the retroareolar right breast projecting in what
appears to be a duct, just anterior to the 2 previously placed
biopsy clips from the prior MRI guided biopsies.
IMPRESSION: Well-positioned ribbon shaped biopsy clip following ultrasound core
needle biopsy of the right breast.

Final Assessment: Post Procedure Mammograms for Marker Placement

## 2019-12-30 ENCOUNTER — Other Ambulatory Visit (INDEPENDENT_AMBULATORY_CARE_PROVIDER_SITE_OTHER): Payer: Self-pay

## 2019-12-30 ENCOUNTER — Telehealth (INDEPENDENT_AMBULATORY_CARE_PROVIDER_SITE_OTHER): Payer: Self-pay | Admitting: Internal Medicine

## 2019-12-30 DIAGNOSIS — M25562 Pain in left knee: Secondary | ICD-10-CM

## 2019-12-30 NOTE — Telephone Encounter (Signed)
I did send this patient a MyChart message but I guess she has not looked at this.  Please let the patient know that the x-ray was normal without any evidence of osteoarthritis.  If her knee is still bothering her, she needs to contact Dr. Moshe Cipro for follow-up.

## 2019-12-30 NOTE — Progress Notes (Unsigned)
Pt was called to give  Results message of knee image. Pt stated she has still having trouble with knee, swelling, pain, difficulty  Walking. Pt Said she call Dr Moshe Cipro office and she want be back for 3 months to see her. She ask if we can refer her to Dr. Aline Brochure in town to see what he can do at this time? Put in a order to be reviewd and signed by Dr Anastasio Champion or [facility] approval.

## 2019-12-30 NOTE — Progress Notes (Signed)
Okay, she will need to follow-up with Dr. Tula Nakayama and her office.

## 2019-12-30 NOTE — Telephone Encounter (Signed)
Wants to see if we can give referral to Dr Aline Brochure.

## 2019-12-30 NOTE — Progress Notes (Signed)
Pt would like to see if she can go to Dr Aline Brochure a referral. Pt said it is still swollen. The best day she had was Friday in walking. Pt ask if they can get her in this week to Dr Aline Brochure.

## 2019-12-30 NOTE — Telephone Encounter (Signed)
Okay, that will be fine you can give her a referral to see Dr. Aline Brochure with a diagnosis of knee pain.  Thanks.

## 2019-12-30 NOTE — Telephone Encounter (Signed)
Sending over now.

## 2019-12-31 ENCOUNTER — Ambulatory Visit: Payer: BLUE CROSS/BLUE SHIELD | Admitting: Orthopedic Surgery

## 2019-12-31 ENCOUNTER — Encounter: Payer: Self-pay | Admitting: Orthopedic Surgery

## 2019-12-31 ENCOUNTER — Other Ambulatory Visit: Payer: Self-pay

## 2019-12-31 VITALS — BP 145/86 | HR 97 | Ht 61.0 in | Wt 225.0 lb

## 2019-12-31 DIAGNOSIS — M23322 Other meniscus derangements, posterior horn of medial meniscus, left knee: Secondary | ICD-10-CM

## 2019-12-31 DIAGNOSIS — Z6841 Body Mass Index (BMI) 40.0 and over, adult: Secondary | ICD-10-CM | POA: Diagnosis not present

## 2019-12-31 DIAGNOSIS — M23321 Other meniscus derangements, posterior horn of medial meniscus, right knee: Secondary | ICD-10-CM

## 2019-12-31 MED ORDER — HYDROCODONE-ACETAMINOPHEN 5-325 MG PO TABS: 1.0000 | ORAL_TABLET | Freq: Four times a day (QID) | ORAL | 0 refills | Status: DC | PRN

## 2019-12-31 NOTE — Patient Instructions (Addendum)
Naproxen caused facial swelling   Start therapy on the knee   Injection of the knee  norco for pain  Meniscus Tear  A meniscus tear is a knee injury that happens when a piece of the meniscus is torn. The meniscus is a thick, rubbery, wedge-shaped cartilage in the knee. Two menisci are located in each knee. They sit between the upper bone (femur) and lower bone (tibia) that make up the knee joint. Each meniscus acts as a shock absorber for the knee. A torn meniscus is one of the most common types of knee injuries. This injury can range from mild to severe. Surgery may be needed to repair a severe tear. What are the causes? This condition may be caused by any kneeling, squatting, twisting, or pivoting movement. Sports-related injuries are the most common cause. These often occur from:  Running and stopping suddenly. ? Changing direction. ? Being tackled or knocked off your feet.  Lifting or carrying heavy weights. As people get older, their menisci get thinner and weaker. In these people, tears can happen more easily, such as from climbing stairs. What increases the risk? You are more likely to develop this condition if you:  Play contact sports.  Have a job that requires kneeling or squatting.  Are female.  Are over 29 years old. What are the signs or symptoms? Symptoms of this condition include:  Knee pain, especially at the side of the knee joint. You may feel pain when the injury occurs, or you may only hear a pop and feel pain later.  A feeling that your knee is clicking, catching, locking, or giving way.  Not being able to fully bend or extend your knee.  Bruising or swelling in your knee. How is this diagnosed? This condition may be diagnosed based on your symptoms and a physical exam. You may also have tests, such as:  X-rays.  MRI.  A procedure to look inside your knee with a narrow surgical telescope (arthroscopy). You may be referred to a knee specialist  (orthopedic surgeon). How is this treated? Treatment for this injury depends on the severity of the tear. Treatment for a mild tear may include:  Rest.  Medicine to reduce pain and swelling. This is usually a nonsteroidal anti-inflammatory drug (NSAID), like ibuprofen.  A knee brace, sleeve, or wrap.  Using crutches or a walker to keep weight off your knee and to help you walk.  Exercises to strengthen your knee (physical therapy). You may need surgery if you have a severe tear or if other treatments are not working. Follow these instructions at home: If you have a brace, sleeve, or wrap:  Wear it as told by your health care provider. Remove it only as told by your health care provider.  Loosen the brace, sleeve, or wrap if your toes tingle, become numb, or turn cold and blue.  Keep the brace, sleeve, or wrap clean and dry.  If the brace, sleeve, or wrap is not waterproof: ? Do not let it get wet. ? Cover it with a watertight covering when you take a bath or shower. Managing pain and swelling   Take over-the-counter and prescription medicines only as told by your health care provider.  If directed, put ice on your knee: ? If you have a removable brace, sleeve, or wrap, remove it as told by your health care provider. ? Put ice in a plastic bag. ? Place a towel between your skin and the bag. ? Leave the ice  on for 20 minutes, 2-3 times per day.  Move your toes often to avoid stiffness and to lessen swelling.  Raise (elevate) the injured area above the level of your heart while you are sitting or lying down. Activity  Do not use the injured limb to support your body weight until your health care provider says that you can. Use crutches or a walker as told by your health care provider.  Return to your normal activities as told by your health care provider. Ask your health care provider what activities are safe for you.  Perform range-of-motion exercises only as told by your  health care provider.  Begin doing exercises to strengthen your knee and leg muscles only as told by your health care provider. After you recover, your health care provider may recommend these exercises to help prevent another injury. General instructions  Use a knee brace, sleeve, or wrap as told by your health care provider.  Ask your health care provider when it is safe to drive if you have a brace, sleeve, or wrap on your knee.  Do not use any products that contain nicotine or tobacco, such as cigarettes, e-cigarettes, and chewing tobacco. If you need help quitting, ask your health care provider.  Ask your health care provider if the medicine prescribed to you: ? Requires you to avoid driving or using heavy machinery. ? Can cause constipation. You may need to take these actions to prevent or treat constipation:  Drink enough fluid to keep your urine pale yellow.  Take over-the-counter or prescription medicines.  Eat foods that are high in fiber, such as beans, whole grains, and fresh fruits and vegetables.  Limit foods that are high in fat and processed sugars, such as fried or sweet foods.  Keep all follow-up visits as told by your health care provider. This is important. Contact a health care provider if:  You have a fever.  Your knee becomes red, tender, or swollen.  Your pain medicine is not helping.  Your symptoms get worse or do not improve after 2 weeks of home care. Summary  A meniscus tear is a knee injury that happens when a piece of the meniscus is torn.  Treatment for this injury depends on the severity of the tear. You may need surgery if you have a severe tear or if other treatments are not working.  Rest, ice, and raise (elevate) your injured knee as told by your health care provider. This will help lessen pain and swelling.  Contact a health care provider if you have new symptoms, or your symptoms get worse or do not improve after 2 weeks of home  care.  Keep all follow-up visits as told by your health care provider. This is important. This information is not intended to replace advice given to you by your health care provider. Make sure you discuss any questions you have with your health care provider. Document Revised: 04/03/2018 Document Reviewed: 04/03/2018 Elsevier Patient Education  [place].

## 2019-12-31 NOTE — Progress Notes (Signed)
Gosroni had entered referral to [facility] and she has appt today

## 2019-12-31 NOTE — Progress Notes (Signed)
Megan Morales  12/31/2019  Body mass index is 42.51 kg/m.   HISTORY SECTION :  Chief Complaint  Patient presents with  . Knee Pain    left/ x 3 weeks    64 year old female presents with a 3-week history of severe pain left knee associated with pain and swelling and stiffness.  Denies catching locking or giving way but no she cannot sleep.  She is allergic to tramadol codeine and naproxen which causes facial swelling so she had a 5-day course of 20 mg of prednisone which did not help her pain     Review of Systems  Genitourinary: Positive for flank pain and hematuria.  All other systems reviewed and are negative.    has a past medical history of Hypertension.   Past Surgical History:  Procedure Laterality Date  . BREAST BIOPSY    . MYOMECTOMY  2010  . TUBAL LIGATION  1992    Body mass index is 42.51 kg/m.   Allergies  Allergen Reactions  . Naproxen   . Codeine Swelling  . Tramadol     Weak and lightheaded     Current Outpatient Medications:  .  amLODipine (NORVASC) 10 MG tablet, Take 1 tablet (10 mg total) by mouth daily., Disp: 90 tablet, Rfl: 1 .  benazepril (LOTENSIN) 40 MG tablet, Take 1 tablet (40 mg total) by mouth daily., Disp: 90 tablet, Rfl: 1 .  Calcium Carbonate-Vitamin D (CALCIUM 600+D) 600-400 MG-UNIT per tablet, Take 1 tablet by mouth daily.  , Disp: , Rfl:  .  hydrochlorothiazide (MICROZIDE) 12.5 MG capsule, Take 1 capsule (12.5 mg total) by mouth daily., Disp: 90 capsule, Rfl: 1 .  Multiple Vitamin (MULTIVITAMIN) capsule, Take 1 capsule by mouth daily.  , Disp: , Rfl:  .  phentermine (ADIPEX-P) 37.5 MG tablet, Take 1 tablet (37.5 mg total) by mouth daily before breakfast., Disp: 30 tablet, Rfl: 1 .  potassium chloride (K-DUR) 10 MEQ tablet, Take 1 tablet (10 mEq total) by mouth daily., Disp: 90 tablet, Rfl: 1 .  HYDROcodone-acetaminophen (NORCO/VICODIN) 5-325 MG tablet, Take 1 tablet by mouth every 6 (six) hours as needed for moderate pain.,  Disp: 30 tablet, Rfl: 0   PHYSICAL EXAM SECTION: 1) BP (!) 145/86   Pulse 97   Ht 5\' 1"  (1.549 m)   Wt 225 lb (102.1 kg)   BMI 42.51 kg/m   Body mass index is 42.51 kg/m. General appearance: Well-developed well-nourished no gross deformities  2) Cardiovascular normal pulse and perfusion , normal color   3) Neurologically deep tendon reflexes are equal and normal, no sensation loss or deficits no pathologic reflexes  4) Psychological: Awake alert and oriented x3 mood and affect normal  5) Skin no lacerations or ulcerations no nodularity no palpable masses, no erythema or nodularity  6) Musculoskeletal:   There is a positive McMurray's sign she has tenderness over the medial joint line she can flex the knee up to 125 degrees and painful to extend the knee and she holds it in flexion and muscle tone is normal skin is intact without rash   MEDICAL DECISION MAKING The patient meets the AMA guidelines for Morbid (severe) obesity with a BMI > 40.0 and I have recommended weight loss.  A.  Encounter Diagnoses  Name Primary?  . Derangement of posterior horn of medial meniscus of right knee Yes  . Derangement of posterior horn of medial meniscus of left knee   . Body mass index 40.0-44.9, adult ([place])   .  Morbid obesity ([place])     B. DATA ANALYSED:  IMAGING: Independent interpretation of images: Images were obtained from outside and I have independently reviewed and she has no arthritis in the joint  Orders: PT  Outside records reviewed: none  C. MANAGEMENT   I think she needs an MRI but she has [employer] so we will have to have her go to therapy give her an injection she was given hydrocodone because of her allergies  Follow-up in 6 weeks  Procedure note left knee injection   verbal consent was obtained to inject left knee joint  Timeout was completed to confirm the site of injection  The medications used were 40 mg of Depo-Medrol and 1% lidocaine 3 cc  Anesthesia  was provided by ethyl chloride and the skin was prepped with alcohol.  After cleaning the skin with alcohol a 20-gauge needle was used to inject the left knee joint. There were no complications. A sterile bandage was applied.    Meds ordered this encounter  Medications  . HYDROcodone-acetaminophen (NORCO/VICODIN) 5-325 MG tablet    Sig: Take 1 tablet by mouth every 6 (six) hours as needed for moderate pain.    Dispense:  30 tablet    Refill:  0      Arther Abbott, MD  12/31/2019 4:10 PM

## 2020-01-14 ENCOUNTER — Other Ambulatory Visit: Payer: Self-pay

## 2020-01-14 ENCOUNTER — Encounter: Payer: Self-pay | Admitting: Physical Therapy

## 2020-01-14 ENCOUNTER — Ambulatory Visit: Payer: BLUE CROSS/BLUE SHIELD | Attending: Orthopedic Surgery | Admitting: Physical Therapy

## 2020-01-14 DIAGNOSIS — R2689 Other abnormalities of gait and mobility: Secondary | ICD-10-CM | POA: Diagnosis not present

## 2020-01-14 DIAGNOSIS — G8929 Other chronic pain: Secondary | ICD-10-CM | POA: Insufficient documentation

## 2020-01-14 DIAGNOSIS — M25562 Pain in left knee: Secondary | ICD-10-CM | POA: Diagnosis not present

## 2020-01-15 NOTE — Therapy (Signed)
[place], [place], [address] Phone: [phone]   Fax:  [phone]  Physical Therapy Evaluation  Patient Details  Name: Megan Morales MRN: [id number] Date of Birth: 21-Apr-1956 Referring Provider (PT): Dr Arther Abbott    Encounter Date: 01/14/2020  PT End of Session - 01/14/20 0814    Visit Number  1    Number of Visits  12    Date for PT Re-Evaluation  02/25/20    Authorization Type  [employer] 50 dollar co-pay    PT Start Time  0800    PT Stop Time  0844    PT Time Calculation (min)  44 min    Activity Tolerance  Patient tolerated treatment well    Behavior During Therapy  [facility] for tasks assessed/performed       Past Medical History:  Diagnosis Date  . Hypertension     Past Surgical History:  Procedure Laterality Date  . BREAST BIOPSY    . MYOMECTOMY  2010  . TUBAL LIGATION  1992    There were no vitals filed for this visit.   Subjective Assessment - 01/14/20 0807    Subjective  Patient began having left knee pain on the 8th of March. She has a history of knee pain but it has resolved in the past. She recieved an injection. Th einjection helped but it is still painful.    Pertinent History  HTN, Plntar facitis, OA of both knees    How long can you sit comfortably?  no limit    How long can you stand comfortably?  depends on the day    How long can you walk comfortably?  depends on the day    Diagnostic tests  X-ray: (-) for arthritis    Currently in Pain?  Yes    Pain Score  5     Pain Location  Knee    Pain Orientation  Left    Pain Descriptors / Indicators  Aching;Burning    Pain Type  Chronic pain    Pain Radiating Towards  can radiate down the lower leg and up the thigh    Pain Onset  More than a month ago    Pain Frequency  Intermittent    Aggravating Factors   Standing and walking    Pain Relieving Factors  Pain medication    Effect of Pain on Daily Activities  difficulty  standing for long periods of time         [facility] PT Assessment - 01/15/20 0001      Assessment   Medical Diagnosis  Left Knee Pain     Referring Provider (PT)  Dr Arther Abbott     Onset Date/Surgical Date  12/09/19    Hand Dominance  Right    Next MD Visit  March 12th     Prior Therapy  Not for her knee       Precautions   Precautions  None      Restrictions   Weight Bearing Restrictions  No      Balance Screen   Has the patient fallen in the past 6 months  No    Has the patient had a decrease in activity level because of a fear of falling?   No    Is the patient reluctant to leave their home because of a fear of falling?   No      Home Environment   Additional Comments  2 level hous.  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     Leisure  Nothing       Cognition   Overall Cognitive Status  Within Functional Limits for tasks assessed    Attention  Focused    Focused Attention  Appears intact    Memory  Appears intact    Awareness  Appears intact    Problem Solving  Appears intact      Observation/Other Assessments   Focus on Therapeutic Outcomes (FOTO)   47% limitation 32 % epected       Sensation   Light Touch  Appears Intact    Additional Comments  when her knee was hurting she had  radicular pain into her thigh and lower leg       Coordination   Gross Motor Movements are Fluid and Coordinated  Yes    Fine Motor Movements are Fluid and Coordinated  Yes      AROM   Overall AROM Comments  pain with end range flexion       PROM   Overall PROM Comments  pain with end range flexion       Strength   Right/Left Hip  Left;Right    Right Hip Flexion  4+/5    Right Hip ABduction  4+/5    Right Hip ADduction  5/5    Left Hip Flexion  4+/5    Left Hip ABduction  4+/5    Left Hip  ADduction  5/5    Right/Left Knee  Left;Right    Right Knee Flexion  5/5    Right Knee Extension  5/5    Left Knee Flexion  5/5    Left Knee Extension  4+/5      Palpation   Palpation comment  No significant tenderness to palpation       Ambulation/Gait   Gait Comments  decreased left single leg stance; lateral movement of the left hip with ambualtion                 Objective measurements completed on examination: See above findings.      [facility] Adult PT Treatment/Exercise - 01/15/20 0001      Knee/Hip Exercises: Stretches   Active Hamstring Stretch Limitations  seated hamstirng stretch for work 3x20 sec hold       Knee/Hip Exercises: Supine   Quad Sets Limitations  x10 5 sec hold     Bridges Limitations  x10    Straight Leg Raises Limitations  x10             PT Education - 01/14/20 0822    Education Details  HEPO, improtance of strengthening    Person(s) Educated  Patient    Methods  Explanation;Demonstration;Tactile cues;Verbal cues    Comprehension  Verbalized understanding;Verbal cues required;Tactile cues required;Returned demonstration       PT Short Term Goals - 01/14/20 0851      PT SHORT TERM GOAL #1   Title  Patient will increase left LE strength to 5/5    Time  3    Period  Weeks    Status  New    Target Date  02/04/20      PT SHORT TERM GOAL #2   Title  Patient  will be independnt with base exercise program    Time  2    Period  Weeks    Status  New    Target Date  01/28/20      PT SHORT TERM GOAL #3   Title  Patient will demonstrate full left knee ROM    Time  3    Period  Weeks    Status  New    Target Date  02/05/20        PT Long Term Goals - 01/14/20 0852      PT LONG TERM GOAL #1   Title  Patient will stand for 30 minutes without self report of increased knee pain    Time  6    Period  Weeks    Status  New    Target Date  02/25/20      PT LONG TERM GOAL #2   Title  Patient will go up and down 10 steps without  pain in order to get to her bedroom in her house    Time  6    Period  Weeks    Status  New    Target Date  02/25/20             Plan - 01/14/20 [id number]    Clinical Impression Statement  Patient is a 64 year old female with left knee pain. She had an injection recentyl that has decreased her pain and helped her sleep. She presents with pain at end range flexion and mild deficits in knee strength. She has decreased weight bearing on the left with ambualtion. She has baseline back and hip pain that may be effecting he gait, She has a high co-pay so her visits will be limited. She was given and HEP. Therapy will have her come back and assess her tolerance and progress as tolerated.    Personal Factors and Comorbidities  --    Comorbidities  bilateral knee OA    Examination-Activity Limitations  Squat;Locomotion Level;Stand    Examination-Participation Restrictions  Community Activity;Meal Prep;Shop    Stability/Clinical Decision Making  Stable/Uncomplicated    Clinical Decision Making  Low    Rehab Potential  Good    PT Frequency  1x / week    PT Duration  6 weeks    PT Treatment/Interventions  ADLs/Self Care Home Management;Cryotherapy;Electrical Stimulation;Iontophoresis 4mg /ml Dexamethasone;Moist Heat;Ultrasound;DME Instruction;Gait training;Stair training;Functional mobility training;Therapeutic activities;Therapeutic exercise;Neuromuscular re-education;Patient/family education;Manual techniques;Passive range of motion;Taping    PT Next Visit Plan  thomas stretch; standing exercises; review HEP; potential discharge 2nd to high co-pay    PT Home Exercise Plan  quad sets, SLR; hip abduction band; bridge; hamstring stretch    Consulted and Agree with Plan of Care  Patient       Patient will benefit from skilled therapeutic intervention in order to improve the following deficits and impairments:  Abnormal gait, Decreased range of motion, Decreased activity tolerance, Pain, Postural  dysfunction, Decreased strength  Visit Diagnosis: Chronic pain of left knee  Other abnormalities of gait and mobility     Problem List Patient Active Problem List   Diagnosis Date Noted  . Plantar fasciitis 01/31/2019  . Osteoarthritis of both knees 01/05/2018  . Morbid obesity ([place]) 01/08/2015  . Annual physical exam 06/09/2014  . Vitamin D deficiency 01/02/2013  . Benign skin lesion of neck 12/31/2012  . Prediabetes 04/07/2011  . FIBROIDS, UTERUS 10/26/2007  . ESSENTIAL HYPERTENSION, BENIGN 10/26/2007    Carney Living PT DPT  01/15/2020,  11:29 AM  [facility] [address] [place], [place], [address] Phone: [phone]   Fax:  [phone]  Name: Megan Morales MRN: [id number] Date of Birth: 24-Oct-1955

## 2020-01-27 ENCOUNTER — Ambulatory Visit: Payer: BLUE CROSS/BLUE SHIELD | Admitting: Physical Therapy

## 2020-01-27 ENCOUNTER — Other Ambulatory Visit: Payer: Self-pay

## 2020-01-27 DIAGNOSIS — R2689 Other abnormalities of gait and mobility: Secondary | ICD-10-CM | POA: Diagnosis not present

## 2020-01-27 DIAGNOSIS — G8929 Other chronic pain: Secondary | ICD-10-CM

## 2020-01-27 DIAGNOSIS — M25562 Pain in left knee: Secondary | ICD-10-CM | POA: Diagnosis not present

## 2020-01-27 NOTE — Therapy (Signed)
[place], [place], [address] Phone: [phone]   Fax:  [phone]  Physical Therapy Treatment  Patient Details  Name: LAQUEDA BIEDRZYCKI MRN: [id number] Date of Birth: May 26, 1956 Referring Provider (PT): Dr Arther Abbott    Encounter Date: 01/27/2020  PT End of Session - 01/27/20 0853    Visit Number  2    Number of Visits  12    Date for PT Re-Evaluation  02/25/20    Authorization Type  [employer] 50 dollar co-pay    PT Start Time  [phone]    PT Stop Time  0930    PT Time Calculation (min)  43 min    Activity Tolerance  Patient tolerated treatment well    Behavior During Therapy  [facility] for tasks assessed/performed       Past Medical History:  Diagnosis Date  . Hypertension     Past Surgical History:  Procedure Laterality Date  . BREAST BIOPSY    . MYOMECTOMY  2010  . TUBAL LIGATION  1992    There were no vitals filed for this visit.  Subjective Assessment - 01/27/20 0851    Subjective  Patient reports her knee has been doing well. She has no pain this morning.    Pertinent History  HTN, Plntar facitis, OA of both knees    How long can you sit comfortably?  no limit    How long can you stand comfortably?  depends on the day    How long can you walk comfortably?  depends on the day    Diagnostic tests  X-ray: (-) for arthritis    Currently in Pain?  No/denies    Pain Descriptors / Indicators  Aching    Pain Type  Chronic pain    Pain Onset  More than a month ago    Pain Frequency  Intermittent    Aggravating Factors   standing and walking    Pain Relieving Factors  pain medication    Effect of Pain on Daily Activities  difficulty standing for long periods of time                       [facility] Adult PT Treatment/Exercise - 01/27/20 0001      Self-Care   Self-Care  Other Self-Care Comments    Other Self-Care Comments   reviewed how to use program going forward.  reviewed  progrression of exercises and activity at home.       Knee/Hip Exercises: Stretches   Other Knee/Hip Stretches  thomas stretch 3x20 sec       Knee/Hip Exercises: Standing   Heel Raises Limitations  x10    Hip Flexion Limitations  standing march slow x10 each leg     Abduction Limitations  x10 each leg     Extension Limitations  x10 each leg       Knee/Hip Exercises: Supine   Bridges Limitations  2x10    Straight Leg Raises Limitations  2x10             PT Education - 01/27/20 0853    Education Details  reviewed HEP and symptom management    Person(s) Educated  Patient    Methods  Explanation;Demonstration;Tactile cues;Verbal cues    Comprehension  Verbalized understanding;Returned demonstration;Verbal cues required;Tactile cues required       PT Short Term Goals - 01/27/20 1048      PT SHORT TERM GOAL #1   Title  Patient will increase left LE strength to 5/5    Period  Weeks    Status  On-going    Target Date  02/04/20      PT SHORT TERM GOAL #2   Title  Patient will be independnt with base exercise program    Time  2    Period  Weeks    Status  On-going    Target Date  01/28/20      PT SHORT TERM GOAL #3   Title  Patient will demonstrate full left knee ROM    Time  3    Period  Weeks    Status  On-going        PT Long Term Goals - 01/14/20 [id number]      PT LONG TERM GOAL #1   Title  Patient will stand for 30 minutes without self report of increased knee pain    Time  6    Period  Weeks    Status  New    Target Date  02/25/20      PT LONG TERM GOAL #2   Title  Patient will go up and down 10 steps without pain in order to get to her bedroom in her house    Time  6    Period  Weeks    Status  New    Target Date  02/25/20            Plan - 01/27/20 0904    Clinical Impression Statement  Patient tolerated treatment well. Therapy reviewed standing exercises for home program. She had no increase in pain. Therapy reviewed total program with the  patient. The patient will return in two weeks for a follow up then may D/C depedning on progress.    Comorbidities  bilateral knee OA    Examination-Activity Limitations  Squat;Locomotion Level;Stand    Examination-Participation Restrictions  Community Activity;Meal Prep;Shop    Stability/Clinical Decision Making  Stable/Uncomplicated    Clinical Decision Making  Low    Rehab Potential  Good    PT Frequency  1x / week    PT Duration  6 weeks    PT Treatment/Interventions  ADLs/Self Care Home Management;Cryotherapy;Electrical Stimulation;Iontophoresis 4mg /ml Dexamethasone;Moist Heat;Ultrasound;DME Instruction;Gait training;Stair training;Functional mobility training;Therapeutic activities;Therapeutic exercise;Neuromuscular re-education;Patient/family education;Manual techniques;Passive range of motion;Taping    PT Next Visit Plan  thomas stretch; standing exercises; review HEP; potential discharge 2nd to high co-pay    PT Home Exercise Plan  quad sets, SLR; hip abduction band; bridge; hamstring stretch       Patient will benefit from skilled therapeutic intervention in order to improve the following deficits and impairments:  Abnormal gait, Decreased range of motion, Decreased activity tolerance, Pain, Postural dysfunction, Decreased strength  Visit Diagnosis: Chronic pain of left knee  Other abnormalities of gait and mobility     Problem List Patient Active Problem List   Diagnosis Date Noted  . Plantar fasciitis 01/31/2019  . Osteoarthritis of both knees 01/05/2018  . Morbid obesity ([place]) 01/08/2015  . Annual physical exam 06/09/2014  . Vitamin D deficiency 01/02/2013  . Benign skin lesion of neck 12/31/2012  . Prediabetes 04/07/2011  . FIBROIDS, UTERUS 10/26/2007  . ESSENTIAL HYPERTENSION, BENIGN 10/26/2007    Carney Living 01/27/2020, 10:49 AM  [facility] [address] [place], [place], [address] Phone: [phone]    Fax:  [phone]  Name: GRISELDA ROPP MRN: [id number] Date of Birth: 09-21-1956

## 2020-02-05 ENCOUNTER — Ambulatory Visit: Payer: BLUE CROSS/BLUE SHIELD | Admitting: Family Medicine

## 2020-02-10 ENCOUNTER — Other Ambulatory Visit: Payer: Self-pay

## 2020-02-10 ENCOUNTER — Ambulatory Visit: Payer: BLUE CROSS/BLUE SHIELD | Attending: Orthopedic Surgery | Admitting: Physical Therapy

## 2020-02-10 ENCOUNTER — Encounter: Payer: Self-pay | Admitting: Physical Therapy

## 2020-02-10 DIAGNOSIS — R2689 Other abnormalities of gait and mobility: Secondary | ICD-10-CM | POA: Diagnosis not present

## 2020-02-10 DIAGNOSIS — M25562 Pain in left knee: Secondary | ICD-10-CM | POA: Insufficient documentation

## 2020-02-10 DIAGNOSIS — G8929 Other chronic pain: Secondary | ICD-10-CM | POA: Diagnosis not present

## 2020-02-10 NOTE — Therapy (Addendum)
[place], [place], [address] Phone: [phone]   Fax:  [phone]  Physical Therapy Treatment/Discharge   Patient Details  Name: Megan Morales MRN: [id number] Date of Birth: Oct 10, 1955 Referring Provider (PT): Dr Arther Abbott    Encounter Date: 02/10/2020  PT End of Session - 02/10/20 0858    Visit Number  3    Number of Visits  12    Date for PT Re-Evaluation  02/25/20    Authorization Type  [employer] 50 dollar co-pay    PT Start Time  [phone]   Patient 6 minutes late   PT Stop Time  0930    PT Time Calculation (min)  39 min    Activity Tolerance  Patient tolerated treatment well    Behavior During Therapy  [facility] for tasks assessed/performed       Past Medical History:  Diagnosis Date  . Hypertension     Past Surgical History:  Procedure Laterality Date  . BREAST BIOPSY    . MYOMECTOMY  2010  . TUBAL LIGATION  1992    There were no vitals filed for this visit.  Subjective Assessment - 02/10/20 0856    Subjective  Patient reported some soreness last night. She has been doing a little more. She has been watching her grandchildren.    Pertinent History  HTN, Plntar facitis, OA of both knees    How long can you sit comfortably?  no limit    How long can you stand comfortably?  depends on the day    How long can you walk comfortably?  depends on the day    Diagnostic tests  X-ray: (-) for arthritis    Currently in Pain?  No/denies                       [facility] Adult PT Treatment/Exercise - 02/10/20 0001      Self-Care   Other Self-Care Comments   reviewed where to purchase cuff weights. Reviewed progression of weight and reps; reviewed RICE for home program       Knee/Hip Exercises: Stretches   Active Hamstring Stretch Limitations  seated hamstirng stretch for work 3x20 sec hold     Other Knee/Hip Stretches  thomas stretch 3x20 sec       Knee/Hip Exercises: Standing   Heel  Raises Limitations  x20    Hip Flexion Limitations  standing march slow x10 each leg     Abduction Limitations  2x10 each leg     Extension Limitations  2x10 each leg       Knee/Hip Exercises: Supine   Bridges Limitations  2x10    Straight Leg Raises Limitations  2x10 each leg       Manual Therapy   Manual therapy comments  reviewed self patella mobilization for home program              PT Education - 02/10/20 0858    Education Details  HEP and symptom mangement    Person(s) Educated  Patient    Methods  Explanation;Demonstration;Tactile cues;Verbal cues    Comprehension  Returned demonstration;Verbalized understanding;Verbal cues required;Tactile cues required       PT Short Term Goals - 02/10/20 0956      PT SHORT TERM GOAL #1   Title  Patient will increase left LE strength to 5/5    Baseline  4+/5 bilateral hip flexion    Time  3  Period  Weeks    Status  On-going    Target Date  02/04/20      PT SHORT TERM GOAL #2   Title  Patient will be independnt with base exercise program    Baseline  perfroming all exercises at home    Period  Weeks    Status  Achieved    Target Date  01/28/20      PT SHORT TERM GOAL #3   Title  Patient will demonstrate full left knee ROM    Baseline  full ROM without pain this morning    Time  3    Period  Weeks    Status  Achieved    Target Date  02/05/20        PT Long Term Goals - 02/10/20 1000      PT LONG TERM GOAL #1   Title  Patient will stand for 30 minutes without self report of increased knee pain    Baseline  still having pain    Time  6    Period  Weeks    Status  On-going      PT LONG TERM GOAL #2   Title  Patient will go up and down 10 steps without pain in order to get to her bedroom in her house    Baseline  improved steps but still painful    Time  6    Period  Weeks    Status  On-going            Plan - 02/10/20 0916    Clinical Impression Statement  Patie has mad good prgress. She  continues to have intermittent apin but that is to be expected at this point. She has tolerated exercises well. She has a full program of LE strengthening with minimal knee movement. She was ecnoruaged to continue with her exercises. She will see the MD on WEdensday> she may schedule another apointment depedning on his reccomendations.    Comorbidities  bilateral knee OA    Examination-Activity Limitations  Squat;Locomotion Level;Stand    Examination-Participation Restrictions  Community Activity;Meal Prep;Shop    Stability/Clinical Decision Making  Stable/Uncomplicated    Clinical Decision Making  Low    Rehab Potential  Good    PT Frequency  1x / week    PT Duration  6 weeks    PT Treatment/Interventions  ADLs/Self Care Home Management;Cryotherapy;Electrical Stimulation;Iontophoresis '4mg'$ /ml Dexamethasone;Moist Heat;Ultrasound;DME Instruction;Gait training;Stair training;Functional mobility training;Therapeutic activities;Therapeutic exercise;Neuromuscular re-education;Patient/family education;Manual techniques;Passive range of motion;Taping    PT Next Visit Plan  thomas stretch; standing exercises; review HEP; potential discharge 2nd to high co-pay    PT Home Exercise Plan  quad sets, SLR; hip abduction band; bridge; hamstring stretch    Consulted and Agree with Plan of Care  Patient       Patient will benefit from skilled therapeutic intervention in order to improve the following deficits and impairments:  Abnormal gait, Decreased range of motion, Decreased activity tolerance, Pain, Postural dysfunction, Decreased strength  Visit Diagnosis: Chronic pain of left knee  Other abnormalities of gait and mobility    PHYSICAL THERAPY DISCHARGE SUMMARY  Visits from Start of Care: 3  Current functional level related to goals / functional outcomes: Improved pain    Remaining deficits: Mild pain at times when standing    Education / Equipment: HEP   Plan: Patient agrees to discharge.   Patient goals were not met. Patient is being discharged due to meeting the stated rehab goals.  ?????  Problem List Patient Active Problem List   Diagnosis Date Noted  . Plantar fasciitis 01/31/2019  . Osteoarthritis of both knees 01/05/2018  . Morbid obesity ([place]) 01/08/2015  . Annual physical exam 06/09/2014  . Vitamin D deficiency 01/02/2013  . Benign skin lesion of neck 12/31/2012  . Prediabetes 04/07/2011  . FIBROIDS, UTERUS 10/26/2007  . ESSENTIAL HYPERTENSION, BENIGN 10/26/2007    Carney Living  PT DPT  02/10/2020, 10:04 AM  [facility] [address] [place], [place], [address] Phone: [phone]   Fax:  [phone]  Name: Megan Morales MRN: [id number] Date of Birth: 08/01/1956

## 2020-02-12 ENCOUNTER — Other Ambulatory Visit: Payer: Self-pay

## 2020-02-12 ENCOUNTER — Encounter: Payer: Self-pay | Admitting: Orthopedic Surgery

## 2020-02-12 ENCOUNTER — Ambulatory Visit: Payer: BLUE CROSS/BLUE SHIELD | Admitting: Orthopedic Surgery

## 2020-02-12 DIAGNOSIS — M25562 Pain in left knee: Secondary | ICD-10-CM

## 2020-02-12 DIAGNOSIS — Z6841 Body Mass Index (BMI) 40.0 and over, adult: Secondary | ICD-10-CM

## 2020-02-12 DIAGNOSIS — G8929 Other chronic pain: Secondary | ICD-10-CM

## 2020-02-12 NOTE — Progress Notes (Signed)
Chief Complaint  Patient presents with  . Follow-up    Recheck on left knee, post PT.   64 year old female we evaluated for left knee pain on March 30.  I thought she might of had a meniscal tear but [employer] required further nonoperative treatment.  She saw [place] with a 3-week history of severe left knee pain associated with swelling and stiffness but without catching locking or giving way she was having some night pain.  She is allergic to tramadol codeine naproxen so she had a 5-day course of prednisone which did not help.  We sent her to physical therapy injected her knee and put her on hydrocodone and she comes in today with improved pain levels increased motion and she says she is now walking better  Exam shows full extension and flexion of the knee still has mild tenderness on the medial joint line does not seem to have a McMurray's sign.  Knee feels stable   Encounter Diagnoses  Name Primary?  . Morbid obesity ([place]) Yes  . Body mass index 40.0-44.9, adult ([place])   . Chronic pain of left knee     She will continue with home exercises and follow-up with [place] again in a month

## 2020-03-13 ENCOUNTER — Other Ambulatory Visit: Payer: Self-pay

## 2020-03-13 ENCOUNTER — Ambulatory Visit: Payer: BLUE CROSS/BLUE SHIELD | Admitting: Orthopedic Surgery

## 2020-03-13 DIAGNOSIS — M23322 Other meniscus derangements, posterior horn of medial meniscus, left knee: Secondary | ICD-10-CM | POA: Diagnosis not present

## 2020-03-13 DIAGNOSIS — Z6841 Body Mass Index (BMI) 40.0 and over, adult: Secondary | ICD-10-CM | POA: Diagnosis not present

## 2020-03-13 NOTE — Progress Notes (Signed)
BP 132/83   Pulse 89   Ht 5\' 1"  (1.549 m)   Wt 220 lb (99.8 kg)   BMI 41.57 kg/m  Encounter Diagnoses  Name Primary?  . Morbid obesity ([place]) Yes  . Body mass index 40.0-44.9, adult ([place])   . Derangement of posterior horn of medial meniscus of left knee     Chief Complaint  Patient presents with  . Follow-up    Recheck on left knee.   The patient meets the AMA guidelines for Morbid (severe) obesity with a BMI > 40.0 and I have recommended weight loss.  64 year old female presents with continued knee pain present since late February early March treated with exercises at home activity modification anti-inflammatories and opioid analgesia still having burning pain medial side of the knee  Reexamination reveals tenderness over the medial joint line Flexion arc is 125 degrees knee feels stable trace joint effusion is noted    Encounter Diagnoses  Name Primary?  . Morbid obesity ([place]) Yes  . Body mass index 40.0-44.9, adult ([place])   . Derangement of posterior horn of medial meniscus of left knee     Medial meniscus tear  MRI left knee  Patient wants results called to her

## 2020-04-07 ENCOUNTER — Ambulatory Visit (HOSPITAL_COMMUNITY)
Admission: RE | Admit: 2020-04-07 | Discharge: 2020-04-07 | Disposition: A | Discharge status: Home / Self Care | Payer: BLUE CROSS/BLUE SHIELD | Source: Ambulatory Visit | Attending: Orthopedic Surgery | Admitting: Orthopedic Surgery

## 2020-04-07 ENCOUNTER — Other Ambulatory Visit: Payer: Self-pay

## 2020-04-07 DIAGNOSIS — M23322 Other meniscus derangements, posterior horn of medial meniscus, left knee: Secondary | ICD-10-CM | POA: Diagnosis not present

## 2020-04-07 DIAGNOSIS — M25562 Pain in left knee: Secondary | ICD-10-CM | POA: Diagnosis not present

## 2020-04-08 ENCOUNTER — Telehealth: Payer: Self-pay | Admitting: Orthopedic Surgery

## 2020-04-08 NOTE — Telephone Encounter (Signed)
Left knee  Medial meniscus tear mild arthritis left knee  Results given  The procedure has been fully reviewed with the patient; The risks and benefits of surgery have been discussed and explained and understood. Alternative treatment has also been reviewed, questions were encouraged and answered. The postoperative plan is also been reviewed.  Patient agreeable to left knee arthroscopy partial medial meniscectomy because she says she has to be able to wear her shoes/high heels to church.  Still having burning throbbing pain

## 2020-04-14 ENCOUNTER — Telehealth: Payer: Self-pay | Admitting: Orthopedic Surgery

## 2020-04-14 NOTE — Telephone Encounter (Signed)
Pt wants you to set up surgery for her  Please call her when you have a minutes  Thanks

## 2020-04-20 ENCOUNTER — Other Ambulatory Visit: Payer: Self-pay | Admitting: Orthopedic Surgery

## 2020-04-20 NOTE — Telephone Encounter (Signed)
Patient agreeable to left knee arthroscopy partial medial meniscectomy  I did not call last week I was out of the office I will call her this afternoon.

## 2020-04-24 NOTE — Patient Instructions (Signed)
RADIANCE DEADY  04/24/2020     @PREFPERIOPPHARMACY @   Your procedure is scheduled on  04/30/2020   Report to [facility] at  0700  A.M.  Call this number if you have problems the morning of surgery:  [phone]   Remember:  Do not eat or drink after midnight.                        Take these medicines the morning of surgery with A SIP OF WATER  Amlodipine.    Do not wear jewelry, make-up or nail polish.  Do not wear lotions, powders, or perfume. Please wear deodorant and brush your teeth.  Do not shave 48 hours prior to surgery.  Men may shave face and neck.  Do not bring valuables to the hospital.  [facility] is not responsible for any belongings or valuables.  Contacts, dentures or bridgework may not be worn into surgery.  Leave your suitcase in the car.  After surgery it may be brought to your room.  For patients admitted to the hospital, discharge time will be determined by your treatment team.  Patients discharged the day of surgery will not be allowed to drive home.   Name and phone number of your driver:   family Special instructions:  DO NOT smoke the morning of your procedure.  Please read over the following fact sheets that you were given. Anesthesia Post-op Instructions and Care and Recovery After Surgery       Arthroscopic Knee Ligament Repair, Care After This sheet gives you information about how to care for yourself after your procedure. Your health care provider may also give you more specific instructions. If you have problems or questions, contact your health care provider. What can I expect after the procedure? After the procedure, it is common to have:  Pain in your knee.  Bruising and swelling on your knee, calf, and ankle for 3-4 days.  Fatigue. Follow these instructions at home: If you have a brace or immobilizer:  Wear the brace or immobilizer as told by your health care provider. Remove it only as told by your health  care provider.  Loosen the splint or immobilizer if your toes tingle, become numb, or turn cold and blue.  Keep the brace or immobilizer clean. Bathing  Do not take baths, swim, or use a hot tub until your health care provider approves. Ask your health care provider if you can take showers.  Keep your bandage (dressing) dry until your health care provider says that it can be removed. Cover it and your brace or immobilizer with a watertight covering when you take a shower. Incision care   Follow instructions from your health care provider about how to take care of your incision. Make sure you: ? Wash your hands with soap and water before you change your bandage (dressing). If soap and water are not available, use hand sanitizer. ? Change your dressing as told by your health care provider. ? Leave stitches (sutures), skin glue, or adhesive strips in place. These skin closures may need to stay in place for 2 weeks or longer. If adhesive strip edges start to loosen and curl up, you may trim the loose edges. Do not remove adhesive strips completely unless your health care provider tells you to do that.  Check your incision area every day for signs of infection. Check for: ? More redness, swelling,  or pain. ? More fluid or blood. ? Warmth. ? Pus or a bad smell. Managing pain, stiffness, and swelling   If directed, put ice on the affected area. ? If you have a removable brace or immobilizer, remove it as told by your health care provider. ? Put ice in a plastic bag. ? Place a towel between your skin and the bag or between your brace or immobilizer and the bag. ? Leave the ice on for 20 minutes, 2-3 times a day.  Move your toes often to avoid stiffness and to lessen swelling.  Raise (elevate) the injured area above the level of your heart while you are sitting or lying down. Driving  Do not drive until your health care provider approves. If you have a brace or immobilizer on your leg,  ask your health care provider when it is safe for you to drive.  Do not drive or use heavy machinery while taking prescription pain medicine. Activity  Rest as directed. Ask your health care provider what activities are safe for you.  Do physical therapy exercises as told by your health care provider. Physical therapy will help you regain strength and motion in your knee.  Follow instructions from your health care provider about: ? When you may start motion exercises. ? When you may start riding a stationary bike and doing other low-impact activities. ? When you may start to jog and do other high-impact activities. Safety  Do not use the injured limb to support your body weight until your health care provider says that you can. Use crutches as told by your health care provider. General instructions  Do not use any products that contain nicotine or tobacco, such as cigarettes and e-cigarettes. These can delay bone healing. If you need help quitting, ask your health care provider.  To prevent or treat constipation while you are taking prescription pain medicine, your health care provider may recommend that you: ? Drink enough fluid to keep your urine clear or pale yellow. ? Take over-the-counter or prescription medicines. ? Eat foods that are high in fiber, such as fresh fruits and vegetables, whole grains, and beans. ? Limit foods that are high in fat and processed sugars, such as fried and sweet foods.  Take over-the-counter and prescription medicines only as told by your health care provider.  Keep all follow-up visits as told by your health care provider. This is important. Contact a health care provider if:  You have more redness, swelling, or pain around an incision.  You have more fluid or blood coming from an incision.  Your incision feels warm to the touch.  You have a fever.  You have pain or swelling in your knee, and it gets worse.  You have pain that does not get  better with medicine. Get help right away if:  You have trouble breathing.  You have pus or a bad smell coming from an incision.  You have numbness and tingling near the knee joint. Summary  After the procedure, it is common to have knee pain with bruising and swelling on your knee, calf, and ankle.  Icing your knee and raising your leg above the level of your heart will help control the pain and the swelling.  Do physical therapy exercises as told by your health care provider. Physical therapy will help you regain strength and motion in your knee. This information is not intended to replace advice given to you by your health care provider. Make sure you discuss  any questions you have with your health care provider. Document Revised: 09/01/2017 Document Reviewed: 09/13/2016 Elsevier Patient Education  [place].  Spinal Anesthesia and Epidural Anesthesia, Care After This sheet gives you information about how to care for yourself after your procedure. Your doctor may also give you more specific instructions. If you have problems or questions, call your doctor. Follow these instructions at home: For at least 24 hours after the procedure:   Have a responsible adult stay with you. It is important to have someone help care for you until you are awake and alert.  Rest as needed.  Do not do activities where you could fall or get hurt (injured).  Do not drive.  Do not use heavy machinery.  Do not drink alcohol.  Do not take sleeping pills or medicines that make you sleepy (drowsy).  Do not make important decisions.  Do not sign legal documents.  Do not take care of children on your own. Eating and drinking  If you throw up (vomit), drink water, juice, or soup when nausea and vomiting stop.  Drink enough fluid to keep your pee (urine) pale yellow.  Make sure you do not feel like throwing up (nauseous) before you eat solid foods.  Follow the diet that your doctor  recommends. General instructions  Return to your normal activities as told by your doctor. Ask your doctor what activities are safe for you.  Take over-the-counter and prescription medicines only as told by your doctor.  If you have sleep apnea, surgery and certain medicines can raise your risk for breathing problems. Follow instructions from your doctor about when to wear your sleep device. Your doctor may tell you to wear your sleep device: ? Anytime you are sleeping, including during daytime naps. ? While taking prescription pain medicines, sleeping pills, or medicines that make you sleepy.  Do not use any products that contain nicotine or tobacco. This includes cigarettes and e-cigarettes. ? If you need help quitting, ask your doctor. ? If you smoke, do not smoke by yourself. Make sure someone is nearby in case you need help.  Keep all follow-up visits as told by your doctor. This is important. Contact a doctor if:  It has been more than one day since your procedure and you feel like throwing up.  It has been more than one day since your procedure and you throw up.  You have a rash. Get help right away if:  You have a fever.  You have a headache that lasts a long time.  You have a very bad headache.  Your vision is blurry.  You see two of a single object (double vision).  You are dizzy or light-headed.  You faint.  Your arms or legs tingle, feel weak, or get numb.  You have trouble breathing.  You cannot pee (urinate). Summary  After the procedure, have a responsible adult stay with you at home until you are fully awake and alert.  Do not do activities that might get you injured. Do not drive, use heavy machinery, drink alcohol, or make important decisions for 24 hours after the procedure.  Take medicines as told by your doctor. Do not use products that contain nicotine or tobacco.  Get help right away if you have a fever, blurry vision, difficulty breathing or  passing urine, or weakness or numbness in arms or legs. This information is not intended to replace advice given to you by your health care provider. Make sure you discuss  any questions you have with your health care provider. Document Revised: 09/01/2017 Document Reviewed: 01/11/2016 Elsevier Patient Education  2020 [place] Anesthesia, Adult, Care After This sheet gives you information about how to care for yourself after your procedure. Your health care provider may also give you more specific instructions. If you have problems or questions, contact your health care provider. What can I expect after the procedure? After the procedure, the following side effects are common:  Pain or discomfort at the IV site.  Nausea.  Vomiting.  Sore throat.  Trouble concentrating.  Feeling cold or chills.  Weak or tired.  Sleepiness and fatigue.  Soreness and body aches. These side effects can affect parts of the body that were not involved in surgery. Follow these instructions at home:  For at least 24 hours after the procedure:  Have a responsible adult stay with you. It is important to have someone help care for you until you are awake and alert.  Rest as needed.  Do not: ? Participate in activities in which you could fall or become injured. ? Drive. ? Use heavy machinery. ? Drink alcohol. ? Take sleeping pills or medicines that cause drowsiness. ? Make important decisions or sign legal documents. ? Take care of children on your own. Eating and drinking  Follow any instructions from your health care provider about eating or drinking restrictions.  When you feel hungry, start by eating small amounts of foods that are soft and easy to digest (bland), such as toast. Gradually return to your regular diet.  Drink enough fluid to keep your urine pale yellow.  If you vomit, rehydrate by drinking water, juice, or clear broth. General instructions  If you have sleep  apnea, surgery and certain medicines can increase your risk for breathing problems. Follow instructions from your health care provider about wearing your sleep device: ? Anytime you are sleeping, including during daytime naps. ? While taking prescription pain medicines, sleeping medicines, or medicines that make you drowsy.  Return to your normal activities as told by your health care provider. Ask your health care provider what activities are safe for you.  Take over-the-counter and prescription medicines only as told by your health care provider.  If you smoke, do not smoke without supervision.  Keep all follow-up visits as told by your health care provider. This is important. Contact a health care provider if:  You have nausea or vomiting that does not get better with medicine.  You cannot eat or drink without vomiting.  You have pain that does not get better with medicine.  You are unable to pass urine.  You develop a skin rash.  You have a fever.  You have redness around your IV site that gets worse. Get help right away if:  You have difficulty breathing.  You have chest pain.  You have blood in your urine or stool, or you vomit blood. Summary  After the procedure, it is common to have a sore throat or nausea. It is also common to feel tired.  Have a responsible adult stay with you for the first 24 hours after general anesthesia. It is important to have someone help care for you until you are awake and alert.  When you feel hungry, start by eating small amounts of foods that are soft and easy to digest (bland), such as toast. Gradually return to your regular diet.  Drink enough fluid to keep your urine pale yellow.  Return to your normal  activities as told by your health care provider. Ask your health care provider what activities are safe for you. This information is not intended to replace advice given to you by your health care provider. Make sure you discuss any  questions you have with your health care provider. Document Revised: 09/22/2017 Document Reviewed: 05/05/2017 Elsevier Patient Education  [place]. How to Use Chlorhexidine for Bathing Chlorhexidine gluconate (CHG) is a germ-killing (antiseptic) solution that is used to clean the skin. It can get rid of the bacteria that normally live on the skin and can keep them away for about 24 hours. To clean your skin with CHG, you may be given:  A CHG solution to use in the shower or as part of a sponge bath.  A prepackaged cloth that contains CHG. Cleaning your skin with CHG may help lower the risk for infection:  While you are staying in the intensive care unit of the hospital.  If you have a vascular access, such as a central line, to provide short-term or long-term access to your veins.  If you have a catheter to drain urine from your bladder.  If you are on a ventilator. A ventilator is a machine that helps you breathe by moving air in and out of your lungs.  After surgery. What are the risks? Risks of using CHG include:  A skin reaction.  Hearing loss, if CHG gets in your ears.  Eye injury, if CHG gets in your eyes and is not rinsed out.  The CHG product catching fire. Make sure that you avoid smoking and flames after applying CHG to your skin. Do not use CHG:  If you have a chlorhexidine allergy or have previously reacted to chlorhexidine.  On babies younger than 51 months of age. How to use CHG solution  Use CHG only as told by your health care provider, and follow the instructions on the label.  Use the full amount of CHG as directed. Usually, this is one bottle. During a shower Follow these steps when using CHG solution during a shower (unless your health care provider gives you different instructions): 1. Start the shower. 2. Use your normal soap and shampoo to wash your face and hair. 3. Turn off the shower or move out of the shower stream. 4. Pour the CHG  onto a clean washcloth. Do not use any type of brush or rough-edged sponge. 5. Starting at your neck, lather your body down to your toes. Make sure you follow these instructions: ? If you will be having surgery, pay special attention to the part of your body where you will be having surgery. Scrub this area for at least 1 minute. ? Do not use CHG on your head or face. If the solution gets into your ears or eyes, rinse them well with water. ? Avoid your genital area. ? Avoid any areas of skin that have broken skin, cuts, or scrapes. ? Scrub your back and under your arms. Make sure to wash skin folds. 6. Let the lather sit on your skin for 1-2 minutes or as long as told by your health care provider. 7. Thoroughly rinse your entire body in the shower. Make sure that all body creases and crevices are rinsed well. 8. Dry off with a clean towel. Do not put any substances on your body afterward--such as powder, lotion, or perfume--unless you are told to do so by your health care provider. Only use lotions that are recommended by the manufacturer. 9.  Put on clean clothes or pajamas. 10. If it is the night before your surgery, sleep in clean sheets.  During a sponge bath Follow these steps when using CHG solution during a sponge bath (unless your health care provider gives you different instructions): 1. Use your normal soap and shampoo to wash your face and hair. 2. Pour the CHG onto a clean washcloth. 3. Starting at your neck, lather your body down to your toes. Make sure you follow these instructions: ? If you will be having surgery, pay special attention to the part of your body where you will be having surgery. Scrub this area for at least 1 minute. ? Do not use CHG on your head or face. If the solution gets into your ears or eyes, rinse them well with water. ? Avoid your genital area. ? Avoid any areas of skin that have broken skin, cuts, or scrapes. ? Scrub your back and under your arms. Make  sure to wash skin folds. 4. Let the lather sit on your skin for 1-2 minutes or as long as told by your health care provider. 5. Using a different clean, wet washcloth, thoroughly rinse your entire body. Make sure that all body creases and crevices are rinsed well. 6. Dry off with a clean towel. Do not put any substances on your body afterward--such as powder, lotion, or perfume--unless you are told to do so by your health care provider. Only use lotions that are recommended by the manufacturer. 7. Put on clean clothes or pajamas. 8. If it is the night before your surgery, sleep in clean sheets. How to use CHG prepackaged cloths  Only use CHG cloths as told by your health care provider, and follow the instructions on the label.  Use the CHG cloth on clean, dry skin.  Do not use the CHG cloth on your head or face unless your health care provider tells you to.  When washing with the CHG cloth: ? Avoid your genital area. ? Avoid any areas of skin that have broken skin, cuts, or scrapes. Before surgery Follow these steps when using a CHG cloth to clean before surgery (unless your health care provider gives you different instructions): 1. Using the CHG cloth, vigorously scrub the part of your body where you will be having surgery. Scrub using a back-and-forth motion for 3 minutes. The area on your body should be completely wet with CHG when you are done scrubbing. 2. Do not rinse. Discard the cloth and let the area air-dry. Do not put any substances on the area afterward, such as powder, lotion, or perfume. 3. Put on clean clothes or pajamas. 4. If it is the night before your surgery, sleep in clean sheets.  For general bathing Follow these steps when using CHG cloths for general bathing (unless your health care provider gives you different instructions). 1. Use a separate CHG cloth for each area of your body. Make sure you wash between any folds of skin and between your fingers and toes. Wash  your body in the following order, switching to a new cloth after each step: ? The front of your neck, shoulders, and chest. ? Both of your arms, under your arms, and your hands. ? Your stomach and groin area, avoiding the genitals. ? Your right leg and foot. ? Your left leg and foot. ? The back of your neck, your back, and your buttocks. 2. Do not rinse. Discard the cloth and let the area air-dry. Do not put any  substances on your body afterward--such as powder, lotion, or perfume--unless you are told to do so by your health care provider. Only use lotions that are recommended by the manufacturer. 3. Put on clean clothes or pajamas. Contact a health care provider if:  Your skin gets irritated after scrubbing.  You have questions about using your solution or cloth. Get help right away if:  Your eyes become very red or swollen.  Your eyes itch badly.  Your skin itches badly and is red or swollen.  Your hearing changes.  You have trouble seeing.  You have swelling or tingling in your mouth or throat.  You have trouble breathing.  You swallow any chlorhexidine. Summary  Chlorhexidine gluconate (CHG) is a germ-killing (antiseptic) solution that is used to clean the skin. Cleaning your skin with CHG may help to lower your risk for infection.  You may be given CHG to use for bathing. It may be in a bottle or in a prepackaged cloth to use on your skin. Carefully follow your health care provider's instructions and the instructions on the product label.  Do not use CHG if you have a chlorhexidine allergy.  Contact your health care provider if your skin gets irritated after scrubbing. This information is not intended to replace advice given to you by your health care provider. Make sure you discuss any questions you have with your health care provider. Document Revised: 12/06/2018 Document Reviewed: 08/17/2017 Elsevier Patient Education  [place].

## 2020-04-28 ENCOUNTER — Other Ambulatory Visit: Payer: Self-pay

## 2020-04-28 ENCOUNTER — Encounter (HOSPITAL_COMMUNITY)
Admission: RE | Admit: 2020-04-28 | Discharge: 2020-04-28 | Disposition: A | Discharge status: Home / Self Care | Payer: BLUE CROSS/BLUE SHIELD | Source: Ambulatory Visit | Attending: Orthopedic Surgery | Admitting: Orthopedic Surgery

## 2020-04-28 ENCOUNTER — Encounter (HOSPITAL_COMMUNITY): Payer: Self-pay

## 2020-04-28 ENCOUNTER — Other Ambulatory Visit (HOSPITAL_COMMUNITY)
Admission: RE | Admit: 2020-04-28 | Discharge: 2020-04-28 | Disposition: A | Discharge status: Home / Self Care | Payer: BLUE CROSS/BLUE SHIELD | Source: Ambulatory Visit | Attending: Orthopedic Surgery | Admitting: Orthopedic Surgery

## 2020-04-28 DIAGNOSIS — Z20822 Contact with and (suspected) exposure to covid-19: Secondary | ICD-10-CM | POA: Insufficient documentation

## 2020-04-28 DIAGNOSIS — I4510 Unspecified right bundle-branch block: Secondary | ICD-10-CM | POA: Diagnosis not present

## 2020-04-28 DIAGNOSIS — Z01812 Encounter for preprocedural laboratory examination: Secondary | ICD-10-CM | POA: Insufficient documentation

## 2020-04-28 DIAGNOSIS — Z01810 Encounter for preprocedural cardiovascular examination: Secondary | ICD-10-CM | POA: Insufficient documentation

## 2020-04-28 LAB — CBC WITH DIFFERENTIAL/PLATELET
Abs Immature Granulocytes: 0.01 10*3/uL (ref 0.00–0.07)
Basophils Absolute: 0 10*3/uL (ref 0.0–0.1)
Basophils Relative: 1 %
Eosinophils Absolute: 0.1 10*3/uL (ref 0.0–0.5)
Eosinophils Relative: 3 %
HCT: 38.8 % (ref 36.0–46.0)
Hemoglobin: 12.8 g/dL (ref 12.0–15.0)
Immature Granulocytes: 0 %
Lymphocytes Relative: 33 %
Lymphs Abs: 1.7 10*3/uL (ref 0.7–4.0)
MCH: 30.3 pg (ref 26.0–34.0)
MCHC: 33 g/dL (ref 30.0–36.0)
MCV: 91.9 fL (ref 80.0–100.0)
Monocytes Absolute: 0.4 10*3/uL (ref 0.1–1.0)
Monocytes Relative: 7 %
Neutro Abs: 2.9 10*3/uL (ref 1.7–7.7)
Neutrophils Relative %: 56 %
Platelets: 208 10*3/uL (ref 150–400)
RBC: 4.22 MIL/uL (ref 3.87–5.11)
RDW: 12.6 % (ref 11.5–15.5)
WBC: 5.2 10*3/uL (ref 4.0–10.5)
nRBC: 0 % (ref 0.0–0.2)

## 2020-04-28 LAB — BASIC METABOLIC PANEL
Anion gap: 8 (ref 5–15)
BUN: 16 mg/dL (ref 8–23)
CO2: 25 mmol/L (ref 22–32)
Calcium: 9.1 mg/dL (ref 8.9–10.3)
Chloride: 103 mmol/L (ref 98–111)
Creatinine, Ser: 0.83 mg/dL (ref 0.44–1.00)
GFR calc Af Amer: >60 mL/min (ref >60)
GFR calc non Af Amer: >60 mL/min (ref >60)
Glucose, Bld: 93 mg/dL (ref 70–99)
Potassium: 3.6 mmol/L (ref 3.5–5.1)
Sodium: 136 mmol/L (ref 135–145)

## 2020-04-28 LAB — SARS CORONAVIRUS 2 (TAT 6-24 HRS): SARS Coronavirus 2: NEGATIVE

## 2020-04-29 NOTE — H&P (Signed)
Megan Morales is an 64 y.o. female.   Chief Complaint: Left knee pain HPI: 64 year old female who was evaluated for left knee pain and treated nonoperatively continues to complain of discomfort in the left knee and had an MRI.  The MRI showed she had a torn medial meniscus and this was discussed with her at length.  Patient is very concerned about her ability to perform her activities of daily living and wear her high heeled shoes for church.  She wishes to proceed with arthroscopy of the left knee with partial medial meniscectomy  Past Medical History:  Diagnosis Date  . Hypertension     Past Surgical History:  Procedure Laterality Date  . BREAST BIOPSY    . MYOMECTOMY  2010  . TUBAL LIGATION  1992    Family History  Problem Relation Age of Onset  . Cancer Mother        breast  . Breast cancer Mother 47  . Diabetes Father   . Stroke Father   . Hypertension Father   . Cancer Brother        liver  . Hypertension Brother   . Colon cancer Neg Hx   . Esophageal cancer Neg Hx   . Pancreatic cancer Neg Hx   . Rectal cancer Neg Hx   . Stomach cancer Neg Hx    Social History:  reports that she has never smoked. She has never used smokeless tobacco. She reports that she does not drink alcohol and does not use drugs.  Allergies:  Allergies  Allergen Reactions  . Naproxen Swelling    Face and eyes   . Codeine Swelling  . Tramadol     Weak and lightheaded    No medications prior to admission.    Results for orders placed or performed during the hospital encounter of 04/28/20 (from the past 48 hour(s))  SARS CORONAVIRUS 2 (TAT 6-24 HRS) Nasopharyngeal Nasopharyngeal Swab     Status: None   Collection Time: 04/28/20  1:30 PM   Specimen: Nasopharyngeal Swab  Result Value Ref Range   SARS Coronavirus 2 NEGATIVE NEGATIVE    Comment: (NOTE) SARS-CoV-2 target nucleic acids are NOT DETECTED.  The SARS-CoV-2 RNA is generally detectable in upper and lower respiratory specimens  during the acute phase of infection. Negative results do not preclude SARS-CoV-2 infection, do not rule out co-infections with other pathogens, and should not be used as the sole basis for treatment or other patient management decisions. Negative results must be combined with clinical observations, patient history, and epidemiological information. The expected result is Negative.  Fact Sheet for Patients: SugarRoll.be  Fact Sheet for Healthcare Providers: https://www.woods-mathews.com/  This test is not yet approved or cleared by the [place] FDA and  has been authorized for detection and/or diagnosis of SARS-CoV-2 by FDA under an Emergency Use Authorization (EUA). This EUA will remain  in effect (meaning this test can be used) for the duration of the COVID-19 declaration under Se ction 564(b)(1) of the Act, 21 U.S.C. section 360bbb-3(b)(1), unless the authorization is terminated or revoked sooner.  Performed at [facility], [address], [place], [place] [address]   Basic metabolic panel     Status: None   Collection Time: 04/28/20  1:36 PM  Result Value Ref Range   Sodium 136 135 - 145 mmol/L   Potassium 3.6 3.5 - 5.1 mmol/L   Chloride 103 98 - 111 mmol/L   CO2 25 22 - 32 mmol/L  Glucose, Bld 93 70 - 99 mg/dL    Comment: Glucose reference range applies only to samples taken after fasting for at least 8 hours.   BUN 16 8 - 23 mg/dL   Creatinine, Ser 0.83 0.44 - 1.00 mg/dL   Calcium 9.1 8.9 - 10.3 mg/dL   GFR calc non Af Amer >60 >60 mL/min   GFR calc Af Amer >60 >60 mL/min   Anion gap 8 5 - 15    Comment: Performed at [facility], [address], [place], [place] [address]  CBC WITH DIFFERENTIAL     Status: None   Collection Time: 04/28/20  1:36 PM  Result Value Ref Range   WBC 5.2 4.0 - 10.5 K/uL   RBC 4.22 3.87 - 5.11 MIL/uL   Hemoglobin 12.8 12.0 - 15.0 g/dL   HCT 38.8 36 - 46 %   MCV 91.9 80.0 - 100.0 fL    MCH 30.3 26.0 - 34.0 pg   MCHC 33.0 30.0 - 36.0 g/dL   RDW 12.6 11.5 - 15.5 %   Platelets 208 150 - 400 K/uL   nRBC 0.0 0.0 - 0.2 %   Neutrophils Relative % 56 %   Neutro Abs 2.9 1.7 - 7.7 K/uL   Lymphocytes Relative 33 %   Lymphs Abs 1.7 0.7 - 4.0 K/uL   Monocytes Relative 7 %   Monocytes Absolute 0.4 0 - 1 K/uL   Eosinophils Relative 3 %   Eosinophils Absolute 0.1 0 - 0 K/uL   Basophils Relative 1 %   Basophils Absolute 0.0 0 - 0 K/uL   Immature Granulocytes 0 %   Abs Immature Granulocytes 0.01 0.00 - 0.07 K/uL    Comment: Performed at [facility], [address], [place], [place] [address]   No results found.  Review of Systems  Constitutional: Negative.   Cardiovascular: Negative.   Gastrointestinal: Negative.   All other systems reviewed and are negative.   There were no vitals taken for this visit. Physical Exam Constitutional:      Appearance: She is well-developed.  Musculoskeletal:     Right shoulder: Normal.     Left shoulder: Normal.     Lumbar back: Normal.     Right knee: Normal.     Comments: Left knee tenderness over the medial joint line no effusion range of motion is 125 degrees, no instability was detected on ligamentous exam.  Muscle tone was normal.  Skin:    General: Skin is warm and dry.     Findings: No abrasion, bruising, ecchymosis, erythema or laceration.     Nails: There is no clubbing.  Neurological:     Mental Status: She is alert and oriented to person, place, and time.     Sensory: No sensory deficit.     Motor: No tremor, atrophy or abnormal muscle tone.     Coordination: Coordination normal.     Gait: Gait normal.  Psychiatric:        Behavior: Behavior normal.        Thought Content: Thought content normal.        Judgment: Judgment normal.      AsseCLINICAL DATA:  64 year old female with acute left knee pain.   EXAM: LEFT KNEE - COMPLETE 4+ VIEW   COMPARISON:  None.   FINDINGS: No acute fracture or dislocation. Mild  osteopenia. No significant arthritic changes. Trace suprapatellar effusion may be present. The soft tissues are unremarkable.   IMPRESSION: No acute osseous pathology.  Electronically Signed   By: Anner Crete M.D.   On: 12/23/2019 19:50   CLINICAL DATA:  64 year old female with acute left knee pain.   EXAM:   EXAM: MRI OF THE LEFT KNEE WITHOUT CONTRAST  TECHNIQUE: Multiplanar, multisequence MR imaging of the knee was performed. No intravenous contrast was administered.  COMPARISON:  None.  FINDINGS: MENISCI  Medial meniscus: Tear in the posterior horn includes a radial component along the free edge and a horizontal component reaching the meniscal undersurface near the junction of the posterior horn and body. Complex tearing is seen throughout the majority of the body. Virtually no normal-appearing meniscal tissue is seen in posterior body.  Lateral meniscus:  Intact.  LIGAMENTS  Cruciates:  Intact.  Collaterals:  Intact.  CARTILAGE  Patellofemoral: 2-3 small fissures are seen in cartilage of the patella.  Medial:  Frayed and irregular throughout.  Lateral:  Mildly degenerated.  Joint:  Moderate joint effusion.  Popliteal Fossa:  Tiny Baker's cyst.  Extensor Mechanism:  Intact.  Bones: No fracture or worrisome lesion. Small osteophytes about the knee are most notable along the medial joint line.  Other: None.  IMPRESSION: Extensive tearing of the posterior horn and body of the medial meniscus as described above.  Mild osteoarthritis about the knee most notable in the medial compartment.   Electronically Signed   By: Inge Rise M.D.   On: 04/08/2020 09:39 04/29/2020, 10:14 PM   Assessment and plan   The procedure has been fully reviewed with the patient; The risks and benefits of surgery have been discussed and explained and understood. Alternative treatment has also been reviewed, questions were  encouraged and answered. The postoperative plan is also been reviewed.  Arthroscopy left knee partial medial meniscectomy   Arther Abbott, MD

## 2020-04-30 ENCOUNTER — Ambulatory Visit (HOSPITAL_COMMUNITY)
Admission: RE | Admit: 2020-04-30 | Discharge: 2020-04-30 | Disposition: A | Discharge status: Home / Self Care | Payer: BLUE CROSS/BLUE SHIELD | Attending: Orthopedic Surgery | Admitting: Orthopedic Surgery

## 2020-04-30 ENCOUNTER — Ambulatory Visit (HOSPITAL_COMMUNITY): Payer: BLUE CROSS/BLUE SHIELD | Admitting: Anesthesiology

## 2020-04-30 ENCOUNTER — Encounter (HOSPITAL_COMMUNITY)
Admission: RE | Disposition: A | Discharge status: Home / Self Care | Payer: Self-pay | Source: Home / Self Care | Attending: Orthopedic Surgery

## 2020-04-30 ENCOUNTER — Other Ambulatory Visit: Payer: Self-pay

## 2020-04-30 ENCOUNTER — Encounter (HOSPITAL_COMMUNITY): Payer: Self-pay | Admitting: Orthopedic Surgery

## 2020-04-30 DIAGNOSIS — I10 Essential (primary) hypertension: Secondary | ICD-10-CM | POA: Diagnosis not present

## 2020-04-30 DIAGNOSIS — Z6841 Body Mass Index (BMI) 40.0 and over, adult: Secondary | ICD-10-CM | POA: Insufficient documentation

## 2020-04-30 DIAGNOSIS — M23322 Other meniscus derangements, posterior horn of medial meniscus, left knee: Secondary | ICD-10-CM | POA: Diagnosis not present

## 2020-04-30 DIAGNOSIS — S83232A Complex tear of medial meniscus, current injury, left knee, initial encounter: Secondary | ICD-10-CM | POA: Diagnosis not present

## 2020-04-30 DIAGNOSIS — X58XXXA Exposure to other specified factors, initial encounter: Secondary | ICD-10-CM | POA: Insufficient documentation

## 2020-04-30 HISTORY — PX: KNEE ARTHROSCOPY WITH MEDIAL MENISECTOMY: SHX5651

## 2020-04-30 SURGERY — ARTHROSCOPY, KNEE, WITH MEDIAL MENISCECTOMY
Anesthesia: General | Site: Knee | Laterality: Left

## 2020-04-30 MED ORDER — ONDANSETRON HCL 4 MG/2ML IJ SOLN
4.0000 mg | Freq: Once | INTRAMUSCULAR | Status: AC
  Administered 2020-04-30: 4 mg via INTRAVENOUS
  Filled 2020-04-30: qty 2

## 2020-04-30 MED ORDER — LIDOCAINE 2% (20 MG/ML) 5 ML SYRINGE
INTRAMUSCULAR | Status: AC
  Filled 2020-04-30: qty 5

## 2020-04-30 MED ORDER — SODIUM CHLORIDE 0.9 % IR SOLN
Status: DC | PRN
  Administered 2020-04-30: 1000 mL

## 2020-04-30 MED ORDER — LIDOCAINE HCL (CARDIAC) PF 100 MG/5ML IV SOSY
PREFILLED_SYRINGE | INTRAVENOUS | Status: DC | PRN
  Administered 2020-04-30: 100 mg via INTRAVENOUS

## 2020-04-30 MED ORDER — HYDROCODONE-ACETAMINOPHEN 7.5-325 MG PO TABS
ORAL_TABLET | ORAL | Status: AC
  Filled 2020-04-30: qty 1

## 2020-04-30 MED ORDER — DEXAMETHASONE SODIUM PHOSPHATE 10 MG/ML IJ SOLN
INTRAMUSCULAR | Status: AC
  Filled 2020-04-30: qty 1

## 2020-04-30 MED ORDER — BUPIVACAINE-EPINEPHRINE (PF) 0.5% -1:200000 IJ SOLN
INTRAMUSCULAR | Status: DC | PRN
  Administered 2020-04-30: 60 mL via PERINEURAL

## 2020-04-30 MED ORDER — CEFAZOLIN SODIUM-DEXTROSE 2-4 GM/100ML-% IV SOLN
2.0000 g | INTRAVENOUS | Status: AC
  Administered 2020-04-30: 2 g via INTRAVENOUS
  Filled 2020-04-30: qty 100

## 2020-04-30 MED ORDER — SODIUM CHLORIDE 0.9 % IR SOLN
Status: DC | PRN
  Administered 2020-04-30: 3000 mL

## 2020-04-30 MED ORDER — FENTANYL CITRATE (PF) 100 MCG/2ML IJ SOLN: 25.0000 ug | INTRAMUSCULAR | Status: DC | PRN

## 2020-04-30 MED ORDER — LACTATED RINGERS IV SOLN: INTRAVENOUS | Status: DC | PRN

## 2020-04-30 MED ORDER — CHLORHEXIDINE GLUCONATE 0.12 % MT SOLN
15.0000 mL | Freq: Once | OROMUCOSAL | Status: AC
  Administered 2020-04-30: 15 mL via OROMUCOSAL
  Filled 2020-04-30: qty 15

## 2020-04-30 MED ORDER — HYDROCODONE-ACETAMINOPHEN 5-325 MG PO TABS: 1.0000 | ORAL_TABLET | ORAL | 0 refills | Status: AC | PRN

## 2020-04-30 MED ORDER — FENTANYL CITRATE (PF) 250 MCG/5ML IJ SOLN
INTRAMUSCULAR | Status: AC
  Filled 2020-04-30: qty 5

## 2020-04-30 MED ORDER — ORAL CARE MOUTH RINSE: 15.0000 mL | Freq: Once | OROMUCOSAL | Status: AC

## 2020-04-30 MED ORDER — HYDROCODONE-ACETAMINOPHEN 7.5-325 MG PO TABS
1.0000 | ORAL_TABLET | Freq: Once | ORAL | Status: AC
  Administered 2020-04-30: 1 via ORAL

## 2020-04-30 MED ORDER — EPINEPHRINE PF 1 MG/ML IJ SOLN
INTRAMUSCULAR | Status: AC
  Filled 2020-04-30: qty 5

## 2020-04-30 MED ORDER — BUPIVACAINE-EPINEPHRINE (PF) 0.5% -1:200000 IJ SOLN
INTRAMUSCULAR | Status: AC
  Filled 2020-04-30: qty 60

## 2020-04-30 MED ORDER — PROMETHAZINE HCL 25 MG/ML IJ SOLN: 6.2500 mg | INTRAMUSCULAR | Status: DC | PRN

## 2020-04-30 MED ORDER — ONDANSETRON HCL 4 MG/2ML IJ SOLN
INTRAMUSCULAR | Status: DC | PRN
  Administered 2020-04-30: 4 mg via INTRAVENOUS

## 2020-04-30 MED ORDER — MEPERIDINE HCL 50 MG/ML IJ SOLN: 6.2500 mg | INTRAMUSCULAR | Status: DC | PRN

## 2020-04-30 MED ORDER — MIDAZOLAM HCL 2 MG/2ML IJ SOLN
INTRAMUSCULAR | Status: AC
  Filled 2020-04-30: qty 2

## 2020-04-30 MED ORDER — DEXAMETHASONE SODIUM PHOSPHATE 4 MG/ML IJ SOLN
INTRAMUSCULAR | Status: DC | PRN
  Administered 2020-04-30: 4 mg via INTRAVENOUS

## 2020-04-30 MED ORDER — FENTANYL CITRATE (PF) 100 MCG/2ML IJ SOLN
INTRAMUSCULAR | Status: DC | PRN
  Administered 2020-04-30 (×2): 50 ug via INTRAVENOUS
  Administered 2020-04-30 (×3): 25 ug via INTRAVENOUS
  Administered 2020-04-30: 50 ug via INTRAVENOUS
  Administered 2020-04-30: 25 ug via INTRAVENOUS

## 2020-04-30 MED ORDER — PROPOFOL 10 MG/ML IV BOLUS
INTRAVENOUS | Status: AC
  Filled 2020-04-30: qty 20

## 2020-04-30 MED ORDER — PROPOFOL 10 MG/ML IV BOLUS
INTRAVENOUS | Status: DC | PRN
  Administered 2020-04-30: 200 mg via INTRAVENOUS

## 2020-04-30 MED ORDER — ONDANSETRON HCL 4 MG/2ML IJ SOLN
INTRAMUSCULAR | Status: AC
  Filled 2020-04-30: qty 2

## 2020-04-30 MED ORDER — EPINEPHRINE PF 1 MG/ML IJ SOLN
INTRAMUSCULAR | Status: AC
  Filled 2020-04-30: qty 2

## 2020-04-30 MED ORDER — LACTATED RINGERS IV SOLN: Freq: Once | INTRAVENOUS | Status: AC

## 2020-04-30 MED ORDER — MIDAZOLAM HCL 5 MG/5ML IJ SOLN
INTRAMUSCULAR | Status: DC | PRN
  Administered 2020-04-30: 2 mg via INTRAVENOUS

## 2020-04-30 SURGICAL SUPPLY — 53 items
ABLATOR ASPIRATE 50D MULTI-PRT (SURGICAL WAND) ×2 IMPLANT
APL PRP STRL LF DISP 70% ISPRP (MISCELLANEOUS) ×1
BAG HAMPER (MISCELLANEOUS) ×2 IMPLANT
BLADE SHAVER TORPEDO 4X13 (MISCELLANEOUS) ×2 IMPLANT
BLADE SURG SZ11 CARB STEEL (BLADE) ×2 IMPLANT
BNDG CMPR MED 10X6 ELC LF (GAUZE/BANDAGES/DRESSINGS) ×1
BNDG CMPR STD VLCR NS LF 5.8X6 (GAUZE/BANDAGES/DRESSINGS) ×1
BNDG ELASTIC 6X10 VLCR STRL LF (GAUZE/BANDAGES/DRESSINGS) ×2 IMPLANT
BNDG ELASTIC 6X5.8 VLCR NS LF (GAUZE/BANDAGES/DRESSINGS) ×2 IMPLANT
CHLORAPREP W/TINT 26 (MISCELLANEOUS) ×2 IMPLANT
CLOTH BEACON ORANGE TIMEOUT ST (SAFETY) ×2 IMPLANT
COOLER ICEMAN CLASSIC (MISCELLANEOUS) ×2 IMPLANT
COVER WAND RF STERILE (DRAPES) ×4 IMPLANT
CUFF TOURN SGL QUICK 34 (TOURNIQUET CUFF)
CUFF TRNQT CYL 34X4.125X (TOURNIQUET CUFF) IMPLANT
DECANTER SPIKE VIAL GLASS SM (MISCELLANEOUS) ×4 IMPLANT
DRAPE HALF SHEET 40X57 (DRAPES) ×2 IMPLANT
DRESSING XEROFORM 5X9 (GAUZE/BANDAGES/DRESSINGS) ×2 IMPLANT
GAUZE 4X4 16PLY RFD (DISPOSABLE) ×2 IMPLANT
GAUZE SPONGE 4X4 12PLY STRL (GAUZE/BANDAGES/DRESSINGS) ×2 IMPLANT
GAUZE SPONGE 4X4 16PLY XRAY LF (GAUZE/BANDAGES/DRESSINGS) ×2 IMPLANT
GAUZE XEROFORM 5X9 LF (GAUZE/BANDAGES/DRESSINGS) ×2 IMPLANT
GLOVE BIOGEL PI IND STRL 7.0 (GLOVE) ×2 IMPLANT
GLOVE BIOGEL PI INDICATOR 7.0 (GLOVE) ×2
GLOVE SKINSENSE NS SZ8.0 LF (GLOVE) ×1
GLOVE SKINSENSE STRL SZ8.0 LF (GLOVE) ×1 IMPLANT
GLOVE SS N UNI LF 8.5 STRL (GLOVE) ×2 IMPLANT
GOWN STRL REUS W/TWL LRG LVL3 (GOWN DISPOSABLE) ×2 IMPLANT
GOWN STRL REUS W/TWL XL LVL3 (GOWN DISPOSABLE) ×2 IMPLANT
IV NS IRRIG 3000ML ARTHROMATIC (IV SOLUTION) ×6 IMPLANT
KIT BLADEGUARD II DBL (SET/KITS/TRAYS/PACK) ×2 IMPLANT
KIT TURNOVER CYSTO (KITS) ×2 IMPLANT
MANIFOLD NEPTUNE II (INSTRUMENTS) ×2 IMPLANT
MARKER SKIN DUAL TIP RULER LAB (MISCELLANEOUS) ×2 IMPLANT
NDL HYPO 21X1.5 SAFETY (NEEDLE) ×1 IMPLANT
NEEDLE HYPO 18GX1.5 BLUNT FILL (NEEDLE) ×2 IMPLANT
NEEDLE HYPO 21X1.5 SAFETY (NEEDLE) ×2 IMPLANT
NEEDLE SPNL 18GX3.5 QUINCKE PK (NEEDLE) ×2 IMPLANT
NS IRRIG 1000ML POUR BTL (IV SOLUTION) ×2 IMPLANT
PACK ARTHRO LIMB DRAPE STRL (MISCELLANEOUS) ×2 IMPLANT
PAD ABD 5X9 TENDERSORB (GAUZE/BANDAGES/DRESSINGS) ×2 IMPLANT
PAD ARMBOARD 7.5X6 YLW CONV (MISCELLANEOUS) ×2 IMPLANT
PAD COLD SHLDR WRAP-ON (PAD) ×2 IMPLANT
PADDING CAST COTTON 6X4 STRL (CAST SUPPLIES) ×2 IMPLANT
PADDING WEBRIL 6 STERILE (GAUZE/BANDAGES/DRESSINGS) ×2 IMPLANT
PORT APPOLLO RF 90DEGREE MULTI (SURGICAL WAND) IMPLANT
SET ARTHROSCOPY INST (INSTRUMENTS) ×2 IMPLANT
SET BASIN LINEN APH (SET/KITS/TRAYS/PACK) ×2 IMPLANT
SUT ETHILON 3 0 FSL (SUTURE) ×2 IMPLANT
SYR 10ML LL (SYRINGE) ×2 IMPLANT
SYR 30ML LL (SYRINGE) ×2 IMPLANT
TUBE CONNECTING 12X1/4 (SUCTIONS) ×4 IMPLANT
TUBING IN/OUT FLOW W/MAIN PUMP (TUBING) ×2 IMPLANT

## 2020-04-30 NOTE — Anesthesia Procedure Notes (Signed)
Procedure Name: LMA Insertion Performed by: Tashari Schoenfelder L, CRNA Pre-anesthesia Checklist: Patient identified, Emergency Drugs available, Suction available, Patient being monitored and Timeout performed Patient Re-evaluated:Patient Re-evaluated prior to induction Oxygen Delivery Method: Circle system utilized Preoxygenation: Pre-oxygenation with 100% oxygen Induction Type: IV induction LMA: LMA inserted LMA Size: 4.0 Placement Confirmation: positive ETCO2,  CO2 detector and breath sounds checked- equal and bilateral Tube secured with: Tape Dental Injury: Teeth and Oropharynx as per pre-operative assessment        

## 2020-04-30 NOTE — Anesthesia Preprocedure Evaluation (Signed)
Anesthesia Evaluation  Patient identified by MRN, date of birth, ID band Patient awake    Reviewed: Allergy & Precautions, NPO status , Patient's Chart, lab work & pertinent test results  History of Anesthesia Complications Negative for: history of anesthetic complications  Airway Mallampati: II  TM Distance: >3 FB Neck ROM: Full    Dental  (+) Upper Dentures, Dental Advisory Given   Pulmonary neg pulmonary ROS,    Pulmonary exam normal breath sounds clear to auscultation       Cardiovascular Exercise Tolerance: Good hypertension, Pt. on medications Normal cardiovascular exam Rhythm:Regular Rate:Normal     Neuro/Psych negative neurological ROS  negative psych ROS   GI/Hepatic negative GI ROS, Neg liver ROS,   Endo/Other  Morbid obesity  Renal/GU negative Renal ROS     Musculoskeletal  (+) Arthritis , Osteoarthritis,    Abdominal   Peds  Hematology   Anesthesia Other Findings   Reproductive/Obstetrics                            Anesthesia Physical Anesthesia Plan  ASA: III  Anesthesia Plan: General   Post-op Pain Management:    Induction: Intravenous  PONV Risk Score and Plan: Ondansetron, Dexamethasone and Midazolam  Airway Management Planned: LMA  Additional Equipment:   Intra-op Plan:   Post-operative Plan: Extubation in OR  Informed Consent: I have reviewed the patients History and Physical, chart, labs and discussed the procedure including the risks, benefits and alternatives for the proposed anesthesia with the patient or authorized representative who has indicated his/her understanding and acceptance.     Dental advisory given  Plan Discussed with: CRNA and Surgeon  Anesthesia Plan Comments:        Anesthesia Quick Evaluation

## 2020-04-30 NOTE — Progress Notes (Signed)
RN explained and demonstrated the use of incentive spirometer. Pt demonstrated proper teach back and technique. Instructed to do 5-10 x every hour while awake.

## 2020-04-30 NOTE — Discharge Instructions (Signed)
General Anesthesia, Adult, Care After This sheet gives you information about how to care for yourself after your procedure. Your health care provider may also give you more specific instructions. If you have problems or questions, contact your health care provider. What can I expect after the procedure? After the procedure, the following side effects are common:  Pain or discomfort at the IV site.  Nausea.  Vomiting.  Sore throat.  Trouble concentrating.  Feeling cold or chills.  Weak or tired.  Sleepiness and fatigue.  Soreness and body aches. These side effects can affect parts of the body that were not involved in surgery. Follow these instructions at home:  For at least 24 hours after the procedure:  Have a responsible adult stay with you. It is important to have someone help care for you until you are awake and alert.  Rest as needed.  Do not: ? Participate in activities in which you could fall or become injured. ? Drive. ? Use heavy machinery. ? Drink alcohol. ? Take sleeping pills or medicines that cause drowsiness. ? Make important decisions or sign legal documents. ? Take care of children on your own. Eating and drinking  Follow any instructions from your health care provider about eating or drinking restrictions.  When you feel hungry, start by eating small amounts of foods that are soft and easy to digest (bland), such as toast. Gradually return to your regular diet.  Drink enough fluid to keep your urine pale yellow.  If you vomit, rehydrate by drinking water, juice, or clear broth. General instructions  If you have sleep apnea, surgery and certain medicines can increase your risk for breathing problems. Follow instructions from your health care provider about wearing your sleep device: ? Anytime you are sleeping, including during daytime naps. ? While taking prescription pain medicines, sleeping medicines, or medicines that make you drowsy.  Return to  your normal activities as told by your health care provider. Ask your health care provider what activities are safe for you.  Take over-the-counter and prescription medicines only as told by your health care provider.  If you smoke, do not smoke without supervision.  Keep all follow-up visits as told by your health care provider. This is important. Contact a health care provider if:  You have nausea or vomiting that does not get better with medicine.  You cannot eat or drink without vomiting.  You have pain that does not get better with medicine.  You are unable to pass urine.  You develop a skin rash.  You have a fever.  You have redness around your IV site that gets worse. Get help right away if:  You have difficulty breathing.  You have chest pain.  You have blood in your urine or stool, or you vomit blood. Summary  After the procedure, it is common to have a sore throat or nausea. It is also common to feel tired.  Have a responsible adult stay with you for the first 24 hours after general anesthesia. It is important to have someone help care for you until you are awake and alert.  When you feel hungry, start by eating small amounts of foods that are soft and easy to digest (bland), such as toast. Gradually return to your regular diet.  Drink enough fluid to keep your urine pale yellow.  Return to your normal activities as told by your health care provider. Ask your health care provider what activities are safe for you. This information is not   intended to replace advice given to you by your health care provider. Make sure you discuss any questions you have with your health care provider. Document Revised: 09/22/2017 Document Reviewed: 05/05/2017 Elsevier Patient Education  2020 Elsevier Inc.  

## 2020-04-30 NOTE — Anesthesia Postprocedure Evaluation (Signed)
Anesthesia Post Note  Patient: Megan Morales  Procedure(s) Performed: KNEE ARTHROSCOPY WITH MEDIAL MENISECTOMY (Left Knee)  Patient location during evaluation: PACU Anesthesia Type: General Level of consciousness: awake, awake and alert, oriented and patient cooperative Pain management: pain level controlled Vital Signs Assessment: post-procedure vital signs reviewed and stable Respiratory status: spontaneous breathing, respiratory function stable, nonlabored ventilation and patient connected to nasal cannula oxygen Cardiovascular status: blood pressure returned to baseline and stable Postop Assessment: no headache and no backache Anesthetic complications: no   No complications documented.   Last Vitals:  Vitals:   04/30/20 0715 04/30/20 0746  BP: (!) 136/93 (!) 132/87  Pulse: 86   Resp: 23   Temp: 36.8 C   SpO2: 98%     Last Pain:  Vitals:   04/30/20 0715  TempSrc: Oral  PainSc: 0-No pain                 Tacy Learn

## 2020-04-30 NOTE — Op Note (Signed)
04/30/2020  10:04 AM  PATIENT:  Megan Morales  64 y.o. female  PRE-OPERATIVE DIAGNOSIS:  left knee medial meniscus tear  POST-OPERATIVE DIAGNOSIS:  left knee medial meniscus tear  PROCEDURE:  Procedure(s): KNEE ARTHROSCOPY WITH MEDIAL MENISECTOMY (Left)   We found the patient had a complex tear of the medial meniscus that involved the posterior horn and the body.  She had mild arthritis on the femoral condyle.  The remaining surfaces of the joint were normal with the exception of free edge tearing of the lateral meniscus which was debrided  SURGEON:  Surgeon(s) and Role:    * Carole Civil, MD - Primary  PHYSICIAN ASSISTANT:   ASSISTANTS: none   ANESTHESIA:   general  EBL:  0  BLOOD ADMINISTERED:none  DRAINS: none   LOCAL MEDICATIONS USED:  MARCAINE     SPECIMEN:  No Specimen  DISPOSITION OF SPECIMEN:  N/A  COUNTS:  YES  TOURNIQUET:  * Missing tourniquet times found for documented tourniquets in log: [id number] *  DICTATION: .Dragon Dictation  PLAN OF CARE: Discharge to home after PACU  PATIENT DISPOSITION:  PACU - hemodynamically stable.   Delay start of Pharmacological VTE agent (>24hrs) due to surgical blood loss or risk of bleeding: not applicable   The patient was identified in the preoperative holding area using 2 approved identification mechanisms. The chart was reviewed and updated. The surgical site was confirmed as LEFT knee and marked with an indelible marker.  The patient was taken to the operating room for anesthesia. After successful  GEN anesthesia, ANCEF was used as IV antibiotics.  The patient was placed in the supine position with the (LEFT) the operative extremity in an arthroscopic leg holder and the opposite extremity in a padded leg holder.  The timeout was executed.  A lateral portal was established with an 11 blade and the scope was introduced into the joint. A diagnostic arthroscopy was performed in circumferential manner examining  the entire knee joint. A medial portal was established and the diagnostic arthroscopy was repeated using a probe to palpate intra-articular structures as they were encountered.    The MEDIAL meniscus was resected using a straight torpedo shaver and ArthroCare wand.  The remaining meniscus was confirmed stable by probing  We then used the straight torpedo to debride the free edge tearing of the lateral meniscus  The arthroscopic pump was placed on the wash mode and any excess debris was removed from the joint using suction.  60 cc of Marcaine with epinephrine was injected through the arthroscope.  The portals were closed with 3-0 nylon suture.  A sterile bandage, Ace wrap and Cryo/Cuff was placed and the Cryo/Cuff was activated. The patient was taken to the recovery room in stable condition.  [place]

## 2020-04-30 NOTE — Brief Op Note (Signed)
04/30/2020  10:04 AM  PATIENT:  Megan Morales  64 y.o. female  PRE-OPERATIVE DIAGNOSIS:  left knee medial meniscus tear  POST-OPERATIVE DIAGNOSIS:  left knee medial meniscus tear  PROCEDURE:  Procedure(s): KNEE ARTHROSCOPY WITH MEDIAL MENISECTOMY (Left)   We found the patient had a complex tear of the medial meniscus that involved the posterior horn and the body.  She had mild arthritis on the femoral condyle.  The remaining surfaces of the joint were normal with the exception of free edge tearing of the lateral meniscus which was debrided  SURGEON:  Surgeon(s) and Role:    * Carole Civil, MD - Primary  PHYSICIAN ASSISTANT:   ASSISTANTS: none   ANESTHESIA:   general  EBL:  0  BLOOD ADMINISTERED:none  DRAINS: none   LOCAL MEDICATIONS USED:  MARCAINE     SPECIMEN:  No Specimen  DISPOSITION OF SPECIMEN:  N/A  COUNTS:  YES  TOURNIQUET:  * Missing tourniquet times found for documented tourniquets in log: [id number] *  DICTATION: .Dragon Dictation  PLAN OF CARE: Discharge to home after PACU  PATIENT DISPOSITION:  PACU - hemodynamically stable.   Delay start of Pharmacological VTE agent (>24hrs) due to surgical blood loss or risk of bleeding: not applicable   The patient was identified in the preoperative holding area using 2 approved identification mechanisms. The chart was reviewed and updated. The surgical site was confirmed as LEFT knee and marked with an indelible marker.  The patient was taken to the operating room for anesthesia. After successful  GEN anesthesia, ANCEF was used as IV antibiotics.  The patient was placed in the supine position with the (LEFT) the operative extremity in an arthroscopic leg holder and the opposite extremity in a padded leg holder.  The timeout was executed.  A lateral portal was established with an 11 blade and the scope was introduced into the joint. A diagnostic arthroscopy was performed in circumferential manner examining  the entire knee joint. A medial portal was established and the diagnostic arthroscopy was repeated using a probe to palpate intra-articular structures as they were encountered.    The MEDIAL meniscus was resected using a straight torpedo shaver and ArthroCare wand.  The remaining meniscus was confirmed stable by probing  We then used the straight torpedo to debride the free edge tearing of the lateral meniscus  The arthroscopic pump was placed on the wash mode and any excess debris was removed from the joint using suction.  60 cc of Marcaine with epinephrine was injected through the arthroscope.  The portals were closed with 3-0 nylon suture.  A sterile bandage, Ace wrap and Cryo/Cuff was placed and the Cryo/Cuff was activated. The patient was taken to the recovery room in stable condition.  [place]

## 2020-04-30 NOTE — Transfer of Care (Signed)
Immediate Anesthesia Transfer of Care Note  Patient: Megan Morales  Procedure(s) Performed: KNEE ARTHROSCOPY WITH MEDIAL MENISECTOMY (Left Knee)  Patient Location: PACU  Anesthesia Type:General  Level of Consciousness: awake, alert , oriented and patient cooperative  Airway & Oxygen Therapy: Patient Spontanous Breathing and Patient connected to nasal cannula oxygen  Post-op Assessment: Report given to RN, Post -op Vital signs reviewed and stable and Patient moving all extremities  Post vital signs: Reviewed and stable  Last Vitals:  Vitals Value Taken Time  BP 120/73 04/30/20 1004  Temp    Pulse 80 04/30/20 1007  Resp 10 04/30/20 1007  SpO2 92 % 04/30/20 1007  Vitals shown include unvalidated device data.  Last Pain:  Vitals:   04/30/20 0715  TempSrc: Oral  PainSc: 0-No pain      Patients Stated Pain Goal: 9 (81/82/99 3716)  Complications: No complications documented.

## 2020-04-30 NOTE — Interval H&P Note (Signed)
History and Physical Interval Note:  04/30/2020 7:23 AM  BP (!) 136/93   Pulse 86   Temp 98.3 F (36.8 C) (Oral)   Resp 23   Ht 5\' 1"  (1.549 m)   Wt (!) 99.8 kg   SpO2 98%   BMI 41.57 kg/m     Megan Morales  has presented today for surgery, with the diagnosis of left knee medial meniscus tear.  The various methods of treatment have been discussed with the patient and family. After consideration of risks, benefits and other options for treatment, the patient has consented to  Procedure(s): KNEE ARTHROSCOPY WITH MEDIAL MENISECTOMY (Left) as a surgical intervention.  The patient's history has been reviewed, patient examined, no change in status, stable for surgery.  I have reviewed the patient's chart and labs.  Questions were answered to the patient's satisfaction.     Arther Abbott

## 2020-05-01 ENCOUNTER — Encounter (HOSPITAL_COMMUNITY): Payer: Self-pay | Admitting: Orthopedic Surgery

## 2020-05-04 DIAGNOSIS — Z9889 Other specified postprocedural states: Secondary | ICD-10-CM | POA: Insufficient documentation

## 2020-05-08 ENCOUNTER — Ambulatory Visit (INDEPENDENT_AMBULATORY_CARE_PROVIDER_SITE_OTHER): Payer: BLUE CROSS/BLUE SHIELD | Admitting: Orthopedic Surgery

## 2020-05-08 ENCOUNTER — Encounter: Payer: Self-pay | Admitting: Orthopedic Surgery

## 2020-05-08 ENCOUNTER — Other Ambulatory Visit: Payer: Self-pay

## 2020-05-08 VITALS — Ht 61.0 in | Wt 220.0 lb

## 2020-05-08 DIAGNOSIS — Z9889 Other specified postprocedural states: Secondary | ICD-10-CM

## 2020-05-08 NOTE — Progress Notes (Signed)
Chief Complaint  Patient presents with  . Follow-up    Recheck on left knee, DOS 04-30-20.    Postop status post left knee arthroscopy doing well sutures were removed. She only has a little bit of swelling she is bending your knee 90 degrees  Recommend home exercises with quadriceps strengthening  Out of work 3 weeks  Return in 3 weeks  Encounter Diagnosis  Name Primary?  . S/P left knee arthroscopy 04/30/20 Yes

## 2020-05-08 NOTE — Patient Instructions (Signed)
OOW 3 weeks send note directly to her job

## 2020-05-12 ENCOUNTER — Encounter: Payer: Self-pay | Admitting: Orthopedic Surgery

## 2020-05-25 ENCOUNTER — Ambulatory Visit: Payer: BLUE CROSS/BLUE SHIELD | Admitting: Family Medicine

## 2020-05-25 ENCOUNTER — Encounter: Payer: Self-pay | Admitting: Family Medicine

## 2020-05-25 ENCOUNTER — Other Ambulatory Visit: Payer: Self-pay

## 2020-05-25 VITALS — BP 143/90 | HR 88 | Temp 98.1°F | Ht 61.0 in | Wt 221.1 lb

## 2020-05-25 DIAGNOSIS — Z13220 Encounter for screening for lipoid disorders: Secondary | ICD-10-CM | POA: Diagnosis not present

## 2020-05-25 DIAGNOSIS — I10 Essential (primary) hypertension: Secondary | ICD-10-CM

## 2020-05-25 DIAGNOSIS — Z23 Encounter for immunization: Secondary | ICD-10-CM | POA: Insufficient documentation

## 2020-05-25 DIAGNOSIS — E559 Vitamin D deficiency, unspecified: Secondary | ICD-10-CM

## 2020-05-25 DIAGNOSIS — Z1231 Encounter for screening mammogram for malignant neoplasm of breast: Secondary | ICD-10-CM

## 2020-05-25 DIAGNOSIS — R7303 Prediabetes: Secondary | ICD-10-CM | POA: Diagnosis not present

## 2020-05-25 MED ORDER — BENAZEPRIL-HYDROCHLOROTHIAZIDE 20-12.5 MG PO TABS: ORAL_TABLET | ORAL | 1 refills | Status: DC

## 2020-05-25 NOTE — Patient Instructions (Signed)
Annual physical exam and pap end January or early feb, 2022  Flu vaccine today and note to be provided for your job  Lipid, [place] and vit D today  Please schedule mammogram at checkout  Increase in medication dose for blood pressure as this is still high  New is benazepril /hctz 20/12.5, take TWO daily  STOP benazepril 40 mg and HCTZ 12.5 mg  Thankful that your knee surgery went well!!  It is important that you exercise regularly at least 30 minutes 5 times a week. If you develop chest pain, have severe difficulty breathing, or feel very tired, stop exercising immediately and seek medical attention  Think about what you will eat, plan ahead. Choose " clean, green, fresh or frozen" over canned, processed or packaged foods which are more sugary, salty and fatty. 70 to 75% of food eaten should be vegetables and fruit. Three meals at set times with snacks allowed between meals, but they must be fruit or vegetables. Aim to eat over a 12 hour period , example 7 am to 7 pm, and STOP after  your last meal of the day. Drink water,generally about 64 ounces per day, no other drink is as healthy. Fruit juice is best enjoyed in a healthy way, by EATING the fruit. Thanks for choosing [facility], we consider it a privelige to serve you.

## 2020-05-25 NOTE — Progress Notes (Signed)
Megan Morales     MRN: [id number]      DOB: 1956/02/06   HPI [name] is here for follow up and re-evaluation of chronic medical conditions, medication management and review of any available recent lab and radiology data.  Preventive health is updated, specifically  Cancer screening and Immunization.   Had left knee arthroscopy 07/29 and is recovering well, still out of work Right 4th toe pain after direct trauma, thinks she broke it 3 weeks ago, does not want an xray  The PT denies any adverse reactions to current medications since the last visit.  There are no new concerns.  There are no specific complaints   ROS Denies recent fever or chills. Denies sinus pressure, nasal congestion, ear pain or sore throat. Denies chest congestion, productive cough or wheezing. Denies chest pains, palpitations and leg swelling Denies abdominal pain, nausea, vomiting,diarrhea or constipation.   Denies dysuria, frequency, hesitancy or incontinence. Denies uncontrolled  joint pain, swelling and limitation in mobility. Denies headaches, seizures, numbness, or tingling. Denies depression, anxiety or insomnia. Denies skin break down or rash.   PE  BP (!) 143/90 (BP Location: Left Arm, Patient Position: Sitting, Cuff Size: Normal)   Pulse 88   Temp 98.1 F (36.7 C) (Oral)   Ht 5\' 1"  (1.549 m)   Wt 221 lb 1.3 oz (100.3 kg)   SpO2 96%   BMI 41.77 kg/m   Patient alert and oriented and in no cardiopulmonary distress.  HEENT: No facial asymmetry, EOMI,     Neck supple .  Chest: Clear to auscultation bilaterally.  CVS: S1, S2 no murmurs, no S3.Regular rate.  ABD: Soft non tender.   Ext: No edema  MS: Adequate ROM spine, shoulders, hips and knees.  Skin: Intact, no ulcerations or rash noted.  Psych: Good eye contact, normal affect. Memory intact not anxious or depressed appearing.  CNS: CN 2-12 intact, power,  normal throughout.no focal deficits noted.   Assessment &  Plan  ESSENTIAL HYPERTENSION, BENIGN Uncontrolled , inc HCTZ dose and change to combiuntion pill DASH diet and commitment to daily physical activity for a minimum of 30 minutes discussed and encouraged, as a part of hypertension management. The importance of attaining a healthy weight is also discussed.  BP/Weight 05/25/2020 05/08/2020 04/30/2020 04/28/2020 03/13/2020 02/12/2020 2/99/2426  Systolic BP 834 - 196 222 979 892 119  Diastolic BP 90 - 90 89 83 76 86  Wt. (Lbs) 221.08 220 220.02 220 220 220 225  BMI 41.77 41.57 41.57 41.57 41.57 41.57 42.51       Morbid obesity (HCC)  Patient re-educated about  the importance of commitment to a  minimum of 150 minutes of exercise per week as able.  The importance of healthy food choices with portion control discussed, as well as eating regularly and within a 12 hour window most days. The need to choose "clean , green" food 50 to 75% of the time is discussed, as well as to make water the primary drink and set a goal of 64 ounces water daily.    Weight /BMI 05/25/2020 05/08/2020 04/30/2020  WEIGHT 221 lb 1.3 oz 220 lb 220 lb 0.3 oz  HEIGHT 5\' 1"  5\' 1"  5\' 1"   BMI 41.74 kg/m2 41.57 kg/m2 41.57 kg/m2      Prediabetes Patient educated about the importance of limiting  Carbohydrate intake , the need to commit to daily physical activity for a minimum of 30 minutes , and to commit weight loss. The  fact that changes in all these areas will reduce or eliminate all together the development of diabetes is stressed.  Updated lab needed at/ before next visit.   Diabetic Labs Latest Ref Rng & Units 04/28/2020 05/07/2019 10/25/2018 09/28/2018 09/07/2017  HbA1c <5.7 % of total Hgb - 5.8(H) - 5.8(H) 5.8(H)  Chol <200 mg/dL - 187 - 180 -  HDL > OR = 50 mg/dL - 51 - 49(L) -  Calc LDL mg/dL (calc) - 120(H) - 116(H) -  Triglycerides <150 mg/dL - 66 - 60 -  Creatinine 0.44 - 1.00 mg/dL 0.83 0.98 0.85 1.10(H) 0.83   BP/Weight 05/25/2020 05/08/2020 04/30/2020 04/28/2020  03/13/2020 02/12/2020 3/61/2244  Systolic BP 975 - 300 511 021 117 356  Diastolic BP 90 - 90 89 83 76 86  Wt. (Lbs) 221.08 220 220.02 220 220 220 225  BMI 41.77 41.57 41.57 41.57 41.57 41.57 42.51   No flowsheet data found.    Need for influenza vaccination After obtaining informed consent, the vaccine is  administered , with no adverse effect noted at the time of administration.

## 2020-05-25 NOTE — Assessment & Plan Note (Signed)
After obtaining informed consent, the vaccine is  administered , with no adverse effect noted at the time of administration.  

## 2020-05-25 NOTE — Assessment & Plan Note (Signed)
  Patient re-educated about  the importance of commitment to a  minimum of 150 minutes of exercise per week as able.  The importance of healthy food choices with portion control discussed, as well as eating regularly and within a 12 hour window most days. The need to choose "clean , green" food 50 to 75% of the time is discussed, as well as to make water the primary drink and set a goal of 64 ounces water daily.    Weight /BMI 05/25/2020 05/08/2020 04/30/2020  WEIGHT 221 lb 1.3 oz 220 lb 220 lb 0.3 oz  HEIGHT 5\' 1"  5\' 1"  5\' 1"   BMI 41.77 kg/m2 41.57 kg/m2 41.57 kg/m2

## 2020-05-25 NOTE — Assessment & Plan Note (Signed)
Uncontrolled , inc HCTZ dose and change to combiuntion pill DASH diet and commitment to daily physical activity for a minimum of 30 minutes discussed and encouraged, as a part of hypertension management. The importance of attaining a healthy weight is also discussed.  BP/Weight 05/25/2020 05/08/2020 04/30/2020 04/28/2020 03/13/2020 02/12/2020 01/24/5360  Systolic BP 443 - 154 008 676 195 093  Diastolic BP 90 - 90 89 83 76 86  Wt. (Lbs) 221.08 220 220.02 220 220 220 225  BMI 41.77 41.57 41.57 41.57 41.57 41.57 42.51

## 2020-05-25 NOTE — Assessment & Plan Note (Signed)
Patient educated about the importance of limiting  Carbohydrate intake , the need to commit to daily physical activity for a minimum of 30 minutes , and to commit weight loss. The fact that changes in all these areas will reduce or eliminate all together the development of diabetes is stressed.  Updated lab needed at/ before next visit.   Diabetic Labs Latest Ref Rng & Units 04/28/2020 05/07/2019 10/25/2018 09/28/2018 09/07/2017  HbA1c <5.7 % of total Hgb - 5.8(H) - 5.8(H) 5.8(H)  Chol <200 mg/dL - 187 - 180 -  HDL > OR = 50 mg/dL - 51 - 49(L) -  Calc LDL mg/dL (calc) - 120(H) - 116(H) -  Triglycerides <150 mg/dL - 66 - 60 -  Creatinine 0.44 - 1.00 mg/dL 0.83 0.98 0.85 1.10(H) 0.83   BP/Weight 05/25/2020 05/08/2020 04/30/2020 04/28/2020 03/13/2020 02/12/2020 12/08/6576  Systolic BP 469 - 629 528 413 244 010  Diastolic BP 90 - 90 89 83 76 86  Wt. (Lbs) 221.08 220 220.02 220 220 220 225  BMI 41.77 41.57 41.57 41.57 41.57 41.57 42.51   No flowsheet data found.

## 2020-05-27 ENCOUNTER — Other Ambulatory Visit: Payer: Self-pay | Admitting: Family Medicine

## 2020-05-27 LAB — LIPID PANEL
Chol/HDL Ratio: 3.6 ratio (ref 0.0–4.4)
Cholesterol, Total: 204 mg/dL — ABNORMAL HIGH (ref 100–199)
HDL: 56 mg/dL (ref >39)
LDL Chol Calc (NIH): 135 mg/dL — ABNORMAL HIGH (ref 0–99)
Triglycerides: 73 mg/dL (ref 0–149)
VLDL Cholesterol Cal: 13 mg/dL (ref 5–40)

## 2020-05-27 LAB — VITAMIN D 25 HYDROXY (VIT D DEFICIENCY, FRACTURES): Vit D, 25-Hydroxy: 20.6 ng/mL — ABNORMAL LOW (ref 30.0–100.0)

## 2020-05-27 LAB — TSH: TSH: 1.65 u[IU]/mL (ref 0.450–4.500)

## 2020-05-27 MED ORDER — ERGOCALCIFEROL 1.25 MG (50000 UT) PO CAPS: 50000.0000 [IU] | ORAL_CAPSULE | ORAL | 1 refills | Status: DC

## 2020-06-01 ENCOUNTER — Ambulatory Visit (INDEPENDENT_AMBULATORY_CARE_PROVIDER_SITE_OTHER): Payer: BLUE CROSS/BLUE SHIELD | Admitting: Orthopedic Surgery

## 2020-06-01 ENCOUNTER — Other Ambulatory Visit: Payer: Self-pay | Admitting: Family Medicine

## 2020-06-01 ENCOUNTER — Other Ambulatory Visit: Payer: Self-pay

## 2020-06-01 ENCOUNTER — Encounter: Payer: Self-pay | Admitting: Orthopedic Surgery

## 2020-06-01 ENCOUNTER — Telehealth: Payer: Self-pay | Admitting: Orthopedic Surgery

## 2020-06-01 VITALS — Ht 61.0 in | Wt 218.0 lb

## 2020-06-01 DIAGNOSIS — Z9889 Other specified postprocedural states: Secondary | ICD-10-CM

## 2020-06-01 NOTE — Progress Notes (Signed)
Chief Complaint  Patient presents with  . Knee Pain    left knee ARthroscpoy 04/30/2020. Knee hurts sometimes on inner side of knee. 3/10.     Post op salk mm  Sore medial side   Normal rom  No swelling   Needs alittle strengthening   HEP   Ret to [facility] 25 sep   F/u prn

## 2020-06-01 NOTE — Telephone Encounter (Signed)
Per pt's request and signed release, her work note has been faxed to her work place, [place] [employer]

## 2020-06-01 NOTE — Patient Instructions (Signed)
OOW until sept 25, may return on that day

## 2020-11-02 ENCOUNTER — Telehealth: Payer: BLUE CROSS/BLUE SHIELD | Admitting: Family Medicine

## 2020-11-06 ENCOUNTER — Other Ambulatory Visit: Payer: Self-pay

## 2020-11-06 ENCOUNTER — Encounter: Payer: Self-pay | Admitting: Nurse Practitioner

## 2020-11-06 ENCOUNTER — Ambulatory Visit (INDEPENDENT_AMBULATORY_CARE_PROVIDER_SITE_OTHER): Payer: BLUE CROSS/BLUE SHIELD | Admitting: Nurse Practitioner

## 2020-11-06 VITALS — BP 155/88 | HR 87 | Temp 98.5°F | Resp 20 | Ht 61.0 in | Wt 221.0 lb

## 2020-11-06 DIAGNOSIS — E559 Vitamin D deficiency, unspecified: Secondary | ICD-10-CM | POA: Diagnosis not present

## 2020-11-06 DIAGNOSIS — I10 Essential (primary) hypertension: Secondary | ICD-10-CM | POA: Diagnosis not present

## 2020-11-06 DIAGNOSIS — R7303 Prediabetes: Secondary | ICD-10-CM | POA: Diagnosis not present

## 2020-11-06 DIAGNOSIS — Z13220 Encounter for screening for lipoid disorders: Secondary | ICD-10-CM | POA: Diagnosis not present

## 2020-11-06 DIAGNOSIS — Z0000 Encounter for general adult medical examination without abnormal findings: Secondary | ICD-10-CM

## 2020-11-06 DIAGNOSIS — H6121 Impacted cerumen, right ear: Secondary | ICD-10-CM

## 2020-11-06 NOTE — Assessment & Plan Note (Signed)
-  cerumen blocking visualization of TM in her right ear -she does not want irrigation today -we discussed using hydrogen peroxide ear washes to help get rid of cerumen

## 2020-11-06 NOTE — Assessment & Plan Note (Signed)
-  was prescribed Vit D at last ppt -will recheck Vit.D with next set of labs

## 2020-11-06 NOTE — Assessment & Plan Note (Signed)
-  we discussed PAP, but she would like to wait for a female provider -will set this up in 6 months

## 2020-11-06 NOTE — Assessment & Plan Note (Signed)
-  her BP is elevated this AM -she states her home readings are around 128/84 -no adverse medication effects -she states her work has been stressing her this AM, and she is not interested in increasing her BP meds

## 2020-11-06 NOTE — Patient Instructions (Addendum)
It was great meeting you today.  We will collect fasting labs today.  As long as your labs are great, you will meet back up with Dr. Moshe Morales in 6 months for another set of routine labs and a PAP exam.   [email or web address]">  DASH Eating Plan DASH stands for Dietary Approaches to Stop Hypertension. The DASH eating plan is a healthy eating plan that has been shown to:  Reduce high blood pressure (hypertension).  Reduce your risk for type 2 diabetes, heart disease, and stroke.  Help with weight loss. What are tips for following this plan? Reading food labels  Check food labels for the amount of salt (sodium) per serving. Choose foods with less than 5 percent of the Daily Value of sodium. Generally, foods with less than 300 milligrams (mg) of sodium per serving fit into this eating plan.  To find whole grains, look for the word "whole" as the first word in the ingredient list. Shopping  Buy products labeled as "low-sodium" or "no salt added."  Buy fresh foods. Avoid canned foods and pre-made or frozen meals. Cooking  Avoid adding salt when cooking. Use salt-free seasonings or herbs instead of table salt or sea salt. Check with your health care provider or pharmacist before using salt substitutes.  Do not fry foods. Cook foods using healthy methods such as baking, boiling, grilling, roasting, and broiling instead.  Cook with heart-healthy oils, such as olive, canola, avocado, soybean, or sunflower oil. Meal planning  Eat a balanced diet that includes: ? 4 or more servings of fruits and 4 or more servings of vegetables each day. Try to fill one-half of your plate with fruits and vegetables. ? 6-8 servings of whole grains each day. ? Less than 6 oz (170 g) of lean meat, poultry, or fish each day. A 3-oz (85-g) serving of meat is about the same size as a deck of cards. One egg equals 1 oz (28 g). ? 2-3 servings of low-fat dairy each day. One  serving is 1 cup (237 mL). ? 1 serving of nuts, seeds, or beans 5 times each week. ? 2-3 servings of heart-healthy fats. Healthy fats called omega-3 fatty acids are found in foods such as walnuts, flaxseeds, fortified milks, and eggs. These fats are also found in cold-water fish, such as sardines, salmon, and mackerel.  Limit how much you eat of: ? Canned or prepackaged foods. ? Food that is high in trans fat, such as some fried foods. ? Food that is high in saturated fat, such as fatty meat. ? Desserts and other sweets, sugary drinks, and other foods with added sugar. ? Full-fat dairy products.  Do not salt foods before eating.  Do not eat more than 4 egg yolks a week.  Try to eat at least 2 vegetarian meals a week.  Eat more home-cooked food and less restaurant, buffet, and fast food.   Lifestyle  When eating at a restaurant, ask that your food be prepared with less salt or no salt, if possible.  If you drink alcohol: ? Limit how much you use to:  0-1 drink a day for women who are not pregnant.  0-2 drinks a day for men. ? Be aware of how much alcohol is in your drink. In the [place] one drink equals one 12 oz bottle of beer (355 mL), one 5 oz glass of wine (148 mL), or one 1 oz glass of hard liquor (44 mL). General information  Avoid eating more than 2,300 mg of  salt a day. If you have hypertension, you may need to reduce your sodium intake to 1,500 mg a day.  Work with your health care provider to maintain a healthy body weight or to lose weight. Ask what an ideal weight is for you.  Get at least 30 minutes of exercise that causes your heart to beat faster (aerobic exercise) most days of the week. Activities may include walking, swimming, or biking.  Work with your health care provider or dietitian to adjust your eating plan to your individual calorie needs. What foods should I eat? Fruits All fresh, dried, or frozen fruit. Canned fruit in natural juice (without added  sugar). Vegetables Fresh or frozen vegetables (raw, steamed, roasted, or grilled). Low-sodium or reduced-sodium tomato and vegetable juice. Low-sodium or reduced-sodium tomato sauce and tomato paste. Low-sodium or reduced-sodium canned vegetables. Grains Whole-grain or whole-wheat bread. Whole-grain or whole-wheat pasta. Brown rice. Megan Morales. Bulgur. Whole-grain and low-sodium cereals. Pita bread. Low-fat, low-sodium crackers. Whole-wheat flour tortillas. Meats and other proteins Skinless chicken or [place]. Ground chicken or [place]. Pork with fat trimmed off. Fish and seafood. Egg whites. Dried beans, peas, or lentils. Unsalted nuts, nut butters, and seeds. Unsalted canned beans. Lean cuts of beef with fat trimmed off. Low-sodium, lean precooked or cured meat, such as sausages or meat loaves. Dairy Low-fat (1%) or fat-free (skim) milk. Reduced-fat, low-fat, or fat-free cheeses. Nonfat, low-sodium ricotta or cottage cheese. Low-fat or nonfat yogurt. Low-fat, low-sodium cheese. Fats and oils Soft margarine without trans fats. Vegetable oil. Reduced-fat, low-fat, or light mayonnaise and salad dressings (reduced-sodium). Canola, safflower, olive, avocado, soybean, and sunflower oils. Avocado. Seasonings and condiments Herbs. Spices. Seasoning mixes without salt. Other foods Unsalted popcorn and pretzels. Fat-free sweets. The items listed above may not be a complete list of foods and beverages you can eat. Contact a dietitian for more information. What foods should I avoid? Fruits Canned fruit in a light or heavy syrup. Fried fruit. Fruit in cream or butter sauce. Vegetables Creamed or fried vegetables. Vegetables in a cheese sauce. Regular canned vegetables (not low-sodium or reduced-sodium). Regular canned tomato sauce and paste (not low-sodium or reduced-sodium). Regular tomato and vegetable juice (not low-sodium or reduced-sodium). Megan Morales. Olives. Grains Baked goods made with fat, such  as croissants, muffins, or some breads. Dry pasta or rice meal packs. Meats and other proteins Fatty cuts of meat. Ribs. Fried meat. Megan Morales. Bologna, salami, and other precooked or cured meats, such as sausages or meat loaves. Fat from the back of a pig (fatback). Bratwurst. Salted nuts and seeds. Canned beans with added salt. Canned or smoked fish. Whole eggs or egg yolks. Chicken or [place] with skin. Dairy Whole or 2% milk, cream, and half-and-half. Whole or full-fat cream cheese. Whole-fat or sweetened yogurt. Full-fat cheese. Nondairy creamers. Whipped toppings. Processed cheese and cheese spreads. Fats and oils Butter. Stick margarine. Lard. Shortening. Ghee. Bacon fat. Tropical oils, such as coconut, palm kernel, or palm oil. Seasonings and condiments Onion salt, garlic salt, seasoned salt, table salt, and sea salt. Worcestershire sauce. Tartar sauce. Barbecue sauce. Teriyaki sauce. Soy sauce, including reduced-sodium. Steak sauce. Canned and packaged gravies. Fish sauce. Oyster sauce. Cocktail sauce. Store-bought horseradish. Ketchup. Mustard. Meat flavorings and tenderizers. Bouillon cubes. Hot sauces. Pre-made or packaged marinades. Pre-made or packaged taco seasonings. Relishes. Regular salad dressings. Other foods Salted popcorn and pretzels. The items listed above may not be a complete list of foods and beverages you should avoid. Contact a dietitian for more information. Where to  find more information  National Heart, Lung, and Blood Institute: https://wilson-eaton.com/  American Heart Association: www.heart.org  Academy of Nutrition and Dietetics: www.eatright.[place]: www.kidney.org Summary  The DASH eating plan is a healthy eating plan that has been shown to reduce high blood pressure (hypertension). It may also reduce your risk for type 2 diabetes, heart disease, and stroke.  When on the DASH eating plan, aim to eat more fresh fruits and vegetables, whole  grains, lean proteins, low-fat dairy, and heart-healthy fats.  With the DASH eating plan, you should limit salt (sodium) intake to 2,300 mg a day. If you have hypertension, you may need to reduce your sodium intake to 1,500 mg a day.  Work with your health care provider or dietitian to adjust your eating plan to your individual calorie needs. This information is not intended to replace advice given to you by your health care provider. Make sure you discuss any questions you have with your health care provider. Document Revised: 08/23/2019 Document Reviewed: 08/23/2019 Elsevier Patient Education  2021 [employer].

## 2020-11-06 NOTE — Progress Notes (Addendum)
Established Patient Office Visit  Subjective:  Patient ID: Megan Morales, female    DOB: [birth year]  Age: 65 y.o. MRN: [id number]  CC:  Chief Complaint  Patient presents with  . Annual Exam    With pap     HPI Megan Morales presents for annual physical exam.  Past Medical History:  Diagnosis Date  . Hypertension     Past Surgical History:  Procedure Laterality Date  . BREAST BIOPSY    . KNEE ARTHROSCOPY WITH MEDIAL MENISECTOMY Left 04/30/2020   Procedure: KNEE ARTHROSCOPY WITH MEDIAL MENISECTOMY;  Surgeon: Carole Civil, MD;  Location: [facility];  Service: Orthopedics;  Laterality: Left;  . MYOMECTOMY  2010  . TUBAL LIGATION  1992    Family History  Problem Relation Age of Onset  . Cancer Mother        breast  . Breast cancer Mother 19  . Diabetes Father   . Stroke Father   . Hypertension Father   . Cancer Brother        liver  . Hypertension Brother   . Colon cancer Neg Hx   . Esophageal cancer Neg Hx   . Pancreatic cancer Neg Hx   . Rectal cancer Neg Hx   . Stomach cancer Neg Hx     Social History   Socioeconomic History  . Marital status: Married    Spouse name: Not on file  . Number of children: Not on file  . Years of education: Not on file  . Highest education level: Not on file  Occupational History  . Not on file  Tobacco Use  . Smoking status: Never Smoker  . Smokeless tobacco: Never Used  Vaping Use  . Vaping Use: Never used  Substance and Sexual Activity  . Alcohol use: No    Alcohol/week: 0.0 standard drinks  . Drug use: No  . Sexual activity: Not on file  Other Topics Concern  . Not on file  Social History Narrative  . Not on file   Social Determinants of Health   Financial Resource Strain: Not on file  Food Insecurity: Not on file  Transportation Needs: Not on file  Physical Activity: Not on file  Stress: Not on file  Social Connections: Not on file  Intimate Partner Violence: Not on file    Outpatient  Medications Prior to Visit  Medication Sig Dispense Refill  . amLODipine (NORVASC) 10 MG tablet TAKE 1 TABLET(10 MG) BY MOUTH DAILY 90 tablet 1  . benazepril-hydrochlorthiazide (LOTENSIN HCT) 20-12.5 MG tablet Take two tablets by mouth once daily every morning for blood pressure 180 tablet 1  . Calcium Carbonate-Vitamin D 600-400 MG-UNIT tablet Take 1 tablet by mouth daily.    . ergocalciferol (VITAMIN D2) 1.25 MG (50000 UT) capsule Take 1 capsule (50,000 Units total) by mouth once a week. One capsule once weekly 12 capsule 1  . Multiple Vitamin (MULTIVITAMIN) capsule Take 1 capsule by mouth daily.    . potassium chloride (KLOR-CON) 10 MEQ tablet TAKE 1 TABLET(10 MEQ) BY MOUTH DAILY 90 tablet 1   No facility-administered medications prior to visit.    Allergies  Allergen Reactions  . Naproxen Swelling    Face and eyes   . Codeine Swelling  . Tramadol     Weak and lightheaded    ROS Review of Systems  Constitutional: Negative.   HENT: Negative.   Eyes: Negative.   Respiratory: Negative.   Cardiovascular: Negative.   Gastrointestinal: Negative.  Endocrine: Negative.   Genitourinary: Negative.   Musculoskeletal: Negative.   Skin: Negative.   Allergic/Immunologic: Negative.   Neurological: Negative.   Hematological: Negative.   Psychiatric/Behavioral: Negative.       Objective:    Physical Exam Constitutional:      Appearance: She is obese.  HENT:     Head: Normocephalic and atraumatic.     Right Ear: There is impacted cerumen.     Left Ear: Tympanic membrane, ear canal and external ear normal.     Nose: Nose normal.     Mouth/Throat:     Mouth: Mucous membranes are moist.     Pharynx: Oropharynx is clear.  Eyes:     Extraocular Movements: Extraocular movements intact.     Conjunctiva/sclera: Conjunctivae normal.     Pupils: Pupils are equal, round, and reactive to light.  Cardiovascular:     Rate and Rhythm: Normal rate and regular rhythm.     Pulses: Normal  pulses.     Heart sounds: Normal heart sounds.  Pulmonary:     Effort: Pulmonary effort is normal.     Breath sounds: Normal breath sounds.  Abdominal:     General: Bowel sounds are normal.     Palpations: Abdomen is soft.  Musculoskeletal:        General: Normal range of motion.     Cervical back: Normal range of motion and neck supple.  Skin:    General: Skin is warm.     Capillary Refill: Capillary refill takes less than 2 seconds.  Neurological:     General: No focal deficit present.     Mental Status: She is alert and oriented to person, place, and time.     Cranial Nerves: No cranial nerve deficit.     Sensory: No sensory deficit.     Motor: No weakness.     Coordination: Coordination normal.     Gait: Gait normal.  Psychiatric:        Mood and Affect: Mood normal.        Behavior: Behavior normal.        Thought Content: Thought content normal.        Judgment: Judgment normal.     BP (!) 155/88   Pulse 87   Temp 98.5 F (36.9 C)   Resp 20   Ht $R'5\' 1"'Cu$  (1.549 m)   Wt 221 lb (100.2 kg)   SpO2 94%   BMI 41.76 kg/m  Wt Readings from Last 3 Encounters:  11/06/20 221 lb (100.2 kg)  06/01/20 218 lb (98.9 kg)  05/25/20 221 lb 1.3 oz (100.3 kg)     There are no preventive care reminders to display for this patient.  There are no preventive care reminders to display for this patient.  Lab Results  Component Value Date   TSH 1.650 05/25/2020   Lab Results  Component Value Date   WBC 5.2 04/28/2020   HGB 12.8 04/28/2020   HCT 38.8 04/28/2020   MCV 91.9 04/28/2020   PLT 208 04/28/2020   Lab Results  Component Value Date   NA 136 04/28/2020   K 3.6 04/28/2020   CO2 25 04/28/2020   GLUCOSE 93 04/28/2020   BUN 16 04/28/2020   CREATININE 0.83 04/28/2020   BILITOT 0.3 10/25/2018   ALKPHOS 104 10/25/2018   AST 19 10/25/2018   ALT 19 10/25/2018   PROT 7.4 10/25/2018   ALBUMIN 3.6 10/25/2018   CALCIUM 9.1 04/28/2020   ANIONGAP 8 04/28/2020  Lab  Results  Component Value Date   CHOL 204 (H) 05/25/2020   Lab Results  Component Value Date   HDL 56 05/25/2020   Lab Results  Component Value Date   LDLCALC 135 (H) 05/25/2020   Lab Results  Component Value Date   TRIG 73 05/25/2020   Lab Results  Component Value Date   CHOLHDL 3.6 05/25/2020   Lab Results  Component Value Date   HGBA1C 5.8 (H) 05/07/2019      Assessment & Plan:   Problem List Items Addressed This Visit      Cardiovascular and Mediastinum   ESSENTIAL HYPERTENSION, BENIGN    -her BP is elevated this AM -she states her home readings are around 128/84 -no adverse medication effects -she states her work has been stressing her this AM, and she is not interested in increasing her BP meds        Nervous and Auditory   Cerumen debris on tympanic membrane of right ear    -cerumen blocking visualization of TM in her right ear -she does not want irrigation today -we discussed using hydrogen peroxide ear washes to help get rid of cerumen        Other   Prediabetes    -Last A1c 5.8 -will draw A1c with labs today      Relevant Orders   Hemoglobin A1c   Vitamin D deficiency    -was prescribed Vit D at last ppt -will recheck Vit.D with next set of labs      Relevant Orders   Vitamin D (25 hydroxy)   Annual physical exam - Primary    -we discussed PAP, but she would like to wait for a female provider -will set this up in 6 months      Relevant Orders   CBC with Differential/Platelet   CMP14+EGFR   Lipid Panel With LDL/HDL Ratio   Vitamin D (25 hydroxy)   Hemoglobin A1c      No orders of the defined types were placed in this encounter.   Follow-up: Return in about 6 months (around 05/06/2021) for Lab follow-up with PAP with Dr. Moshe Cipro.    Noreene Larsson, NP

## 2020-11-06 NOTE — Assessment & Plan Note (Signed)
-  Last A1c 5.8 -will draw A1c with labs today

## 2020-11-07 LAB — CMP14+EGFR
ALT: 16 IU/L (ref 0–32)
AST: 17 IU/L (ref 0–40)
Albumin/Globulin Ratio: 1.4 (ref 1.2–2.2)
Albumin: 4.3 g/dL (ref 3.8–4.8)
Alkaline Phosphatase: 118 IU/L (ref 44–121)
BUN/Creatinine Ratio: 21 (ref 12–28)
BUN: 20 mg/dL (ref 8–27)
Bilirubin Total: 0.3 mg/dL (ref 0.0–1.2)
CO2: 24 mmol/L (ref 20–29)
Calcium: 9.2 mg/dL (ref 8.7–10.3)
Chloride: 103 mmol/L (ref 96–106)
Creatinine, Ser: 0.94 mg/dL (ref 0.57–1.00)
GFR calc Af Amer: 75 mL/min/{1.73_m2} (ref >59)
GFR calc non Af Amer: 65 mL/min/{1.73_m2} (ref >59)
Globulin, Total: 3.1 g/dL (ref 1.5–4.5)
Glucose: 86 mg/dL (ref 65–99)
Potassium: 4 mmol/L (ref 3.5–5.2)
Sodium: 141 mmol/L (ref 134–144)
Total Protein: 7.4 g/dL (ref 6.0–8.5)

## 2020-11-07 LAB — CBC WITH DIFFERENTIAL/PLATELET
Basophils Absolute: 0 10*3/uL (ref 0.0–0.2)
Basos: 1 %
EOS (ABSOLUTE): 0.1 10*3/uL (ref 0.0–0.4)
Eos: 3 %
Hematocrit: 38.6 % (ref 34.0–46.6)
Hemoglobin: 13 g/dL (ref 11.1–15.9)
Immature Grans (Abs): 0 10*3/uL (ref 0.0–0.1)
Immature Granulocytes: 0 %
Lymphocytes Absolute: 1.5 10*3/uL (ref 0.7–3.1)
Lymphs: 32 %
MCH: 30.4 pg (ref 26.6–33.0)
MCHC: 33.7 g/dL (ref 31.5–35.7)
MCV: 90 fL (ref 79–97)
Monocytes Absolute: 0.3 10*3/uL (ref 0.1–0.9)
Monocytes: 7 %
Neutrophils Absolute: 2.7 10*3/uL (ref 1.4–7.0)
Neutrophils: 57 %
Platelets: 196 10*3/uL (ref 150–450)
RBC: 4.28 x10E6/uL (ref 3.77–5.28)
RDW: 13.1 % (ref 11.7–15.4)
WBC: 4.6 10*3/uL (ref 3.4–10.8)

## 2020-11-07 LAB — HEMOGLOBIN A1C
Est. average glucose Bld gHb Est-mCnc: 117 mg/dL
Hgb A1c MFr Bld: 5.7 % — ABNORMAL HIGH (ref 4.8–5.6)

## 2020-11-07 LAB — LIPID PANEL WITH LDL/HDL RATIO
Cholesterol, Total: 172 mg/dL (ref 100–199)
HDL: 51 mg/dL (ref >39)
LDL Chol Calc (NIH): 111 mg/dL — ABNORMAL HIGH (ref 0–99)
LDL/HDL Ratio: 2.2 ratio (ref 0.0–3.2)
Triglycerides: 51 mg/dL (ref 0–149)
VLDL Cholesterol Cal: 10 mg/dL (ref 5–40)

## 2020-11-07 LAB — VITAMIN D 25 HYDROXY (VIT D DEFICIENCY, FRACTURES): Vit D, 25-Hydroxy: 34.7 ng/mL (ref 30.0–100.0)

## 2020-11-09 NOTE — Progress Notes (Signed)
Her A1c is looking good at 5.7%, which is down form her previous at 5.8%.  Her LDL, or bad cholesterol, is still a little elevated at 111, but that is down from the previous 135.  Good work!  Also, your Vit. D was back in the normal range at 34.7. The low end of normal is 30, so you can continue an OTC oral Vit D supplement of 1000 IU per day or 10,000 IU per week.

## 2020-11-12 ENCOUNTER — Ambulatory Visit
Admission: RE | Admit: 2020-11-12 | Discharge: 2020-11-12 | Disposition: A | Discharge status: Home / Self Care | Payer: BLUE CROSS/BLUE SHIELD | Source: Ambulatory Visit | Attending: Family Medicine | Admitting: Family Medicine

## 2020-11-12 ENCOUNTER — Other Ambulatory Visit: Payer: Self-pay

## 2020-11-12 DIAGNOSIS — Z1231 Encounter for screening mammogram for malignant neoplasm of breast: Secondary | ICD-10-CM | POA: Diagnosis not present

## 2020-11-17 ENCOUNTER — Other Ambulatory Visit: Payer: Self-pay | Admitting: Family Medicine

## 2020-11-17 DIAGNOSIS — R928 Other abnormal and inconclusive findings on diagnostic imaging of breast: Secondary | ICD-10-CM

## 2020-11-26 ENCOUNTER — Other Ambulatory Visit: Payer: Self-pay

## 2020-11-26 ENCOUNTER — Ambulatory Visit: Payer: BLUE CROSS/BLUE SHIELD | Admitting: Podiatry

## 2020-11-26 ENCOUNTER — Ambulatory Visit (INDEPENDENT_AMBULATORY_CARE_PROVIDER_SITE_OTHER): Payer: BLUE CROSS/BLUE SHIELD

## 2020-11-26 DIAGNOSIS — M79672 Pain in left foot: Secondary | ICD-10-CM

## 2020-11-26 DIAGNOSIS — M722 Plantar fascial fibromatosis: Secondary | ICD-10-CM

## 2020-11-26 MED ORDER — MELOXICAM 15 MG PO TABS
15.0000 mg | ORAL_TABLET | Freq: Every day | ORAL | 0 refills | Status: DC
Start: 2020-11-26 — End: 2021-05-06

## 2020-11-26 MED ORDER — TRIAMCINOLONE ACETONIDE 10 MG/ML IJ SUSP
10.0000 mg | Freq: Once | INTRAMUSCULAR | Status: AC
  Administered 2020-11-26: 10 mg

## 2020-11-30 ENCOUNTER — Other Ambulatory Visit: Payer: Self-pay | Admitting: Family Medicine

## 2020-11-30 ENCOUNTER — Other Ambulatory Visit: Payer: Self-pay

## 2020-11-30 ENCOUNTER — Ambulatory Visit
Admission: RE | Admit: 2020-11-30 | Discharge: 2020-11-30 | Disposition: A | Discharge status: Home / Self Care | Payer: BLUE CROSS/BLUE SHIELD | Source: Ambulatory Visit | Attending: Family Medicine | Admitting: Family Medicine

## 2020-11-30 DIAGNOSIS — N6489 Other specified disorders of breast: Secondary | ICD-10-CM | POA: Diagnosis not present

## 2020-11-30 DIAGNOSIS — R922 Inconclusive mammogram: Secondary | ICD-10-CM | POA: Diagnosis not present

## 2020-11-30 DIAGNOSIS — R928 Other abnormal and inconclusive findings on diagnostic imaging of breast: Secondary | ICD-10-CM

## 2020-11-30 NOTE — Progress Notes (Signed)
Subjective:   Patient ID: Megan Morales, female   DOB: 65 y.o.   MRN: [id number]   HPI 65 year old female presents the office today for concerns of bilateral foot pain.  She previously had symptoms in her right side but she states that started in her left side as well and currently left side worse than the right.  Denies any recent injury or trauma.  She has had no recent treatment.  She has pain in the morning when she first gets up after periods of rest.  She had a meniscus tear in her knee on July 2021.  She has no other concerns today.   Review of Systems  All other systems reviewed and are negative.  Past Medical History:  Diagnosis Date  . Hypertension     Past Surgical History:  Procedure Laterality Date  . BREAST BIOPSY    . KNEE ARTHROSCOPY WITH MEDIAL MENISECTOMY Left 04/30/2020   Procedure: KNEE ARTHROSCOPY WITH MEDIAL MENISECTOMY;  Surgeon: Carole Civil, MD;  Location: [facility];  Service: Orthopedics;  Laterality: Left;  . MYOMECTOMY  2010  . TUBAL LIGATION  1992     Current Outpatient Medications:  .  meloxicam (MOBIC) 15 MG tablet, Take 1 tablet (15 mg total) by mouth daily., Disp: 30 tablet, Rfl: 0 .  amLODipine (NORVASC) 10 MG tablet, TAKE 1 TABLET(10 MG) BY MOUTH DAILY, Disp: 90 tablet, Rfl: 1 .  benazepril-hydrochlorthiazide (LOTENSIN HCT) 20-12.5 MG tablet, Take two tablets by mouth once daily every morning for blood pressure, Disp: 180 tablet, Rfl: 1 .  Calcium Carbonate-Vitamin D 600-400 MG-UNIT tablet, Take 1 tablet by mouth daily., Disp: , Rfl:  .  ergocalciferol (VITAMIN D2) 1.25 MG (50000 UT) capsule, Take 1 capsule (50,000 Units total) by mouth once a week. One capsule once weekly, Disp: 12 capsule, Rfl: 1 .  Multiple Vitamin (MULTIVITAMIN) capsule, Take 1 capsule by mouth daily., Disp: , Rfl:  .  potassium chloride (KLOR-CON) 10 MEQ tablet, TAKE 1 TABLET(10 MEQ) BY MOUTH DAILY, Disp: 90 tablet, Rfl: 1  Allergies  Allergen Reactions  . Naproxen  Swelling    Face and eyes   . Codeine Swelling  . Tramadol     Weak and lightheaded        Objective:  Physical Exam  General: AAO x3, NAD  Dermatological: Skin is warm, dry and supple bilateral.  There are no open sores, no preulcerative lesions, no rash or signs of infection present.  Vascular: Dorsalis Pedis artery and Posterior Tibial artery pedal pulses are 2/4 bilateral with immedate capillary fill time. There is no pain with calf compression, swelling, warmth, erythema.   Neruologic: Grossly intact via light touch bilateral.  Negative Tinel sign.  Musculoskeletal: Tenderness to palpation along the plantar medial tubercle of the calcaneus at the insertion of plantar fascia on the left >> right foot. There is no pain along the course of the plantar fascia within the arch of the foot. Plantar fascia appears to be intact. There is no pain with lateral compression of the calcaneus or pain with vibratory sensation. There is no pain along the course or insertion of the achilles tendon. No other areas of tenderness to bilateral lower extremities. Muscular strength 5/5 in all groups tested bilateral.  Gait: Unassisted, Nonantalgic.       Assessment:   Bilateral heel pain, plantar fasciitis left side worse than right     Plan:  -Treatment options discussed including all alternatives, risks, and complications -Etiology of symptoms were  discussed -X-rays were obtained and reviewed with the patient.  There is no evidence of acute fracture or stress fracture identified today. -Steroid injection performed in the left side.  See procedure note below. -Plantar fascial brace dispensed -Discussed stretching, icing daily.  Discussed shoe modifications and orthotics.  Procedure: Injection Tendon/Ligament Discussed alternatives, risks, complications and verbal consent was obtained.  Location: Left plantar fascia at the glabrous junction; medial approach. Skin Prep: Alcohol. Injectate:  0.5cc 0.5% marcaine plain, 0.5 cc 2% lidocaine plain and, 1 cc kenalog 10. Disposition: Patient tolerated procedure well. Injection site dressed with a band-aid.  Post-injection care was discussed and return precautions discussed.   Return if symptoms worsen or fail to improve.  Trula Slade DPM

## 2020-12-23 IMAGING — MG DIGITAL SCREENING BILAT W/ TOMO W/ CAD
6 series · 6 of 14 positions shown · non-contrast
Comparison: Previous exam(s).

CLINICAL DATA: Screening.

EXAM:
DIGITAL SCREENING BILATERAL MAMMOGRAM WITH TOMO AND CAD

[L MLO synth-2D]
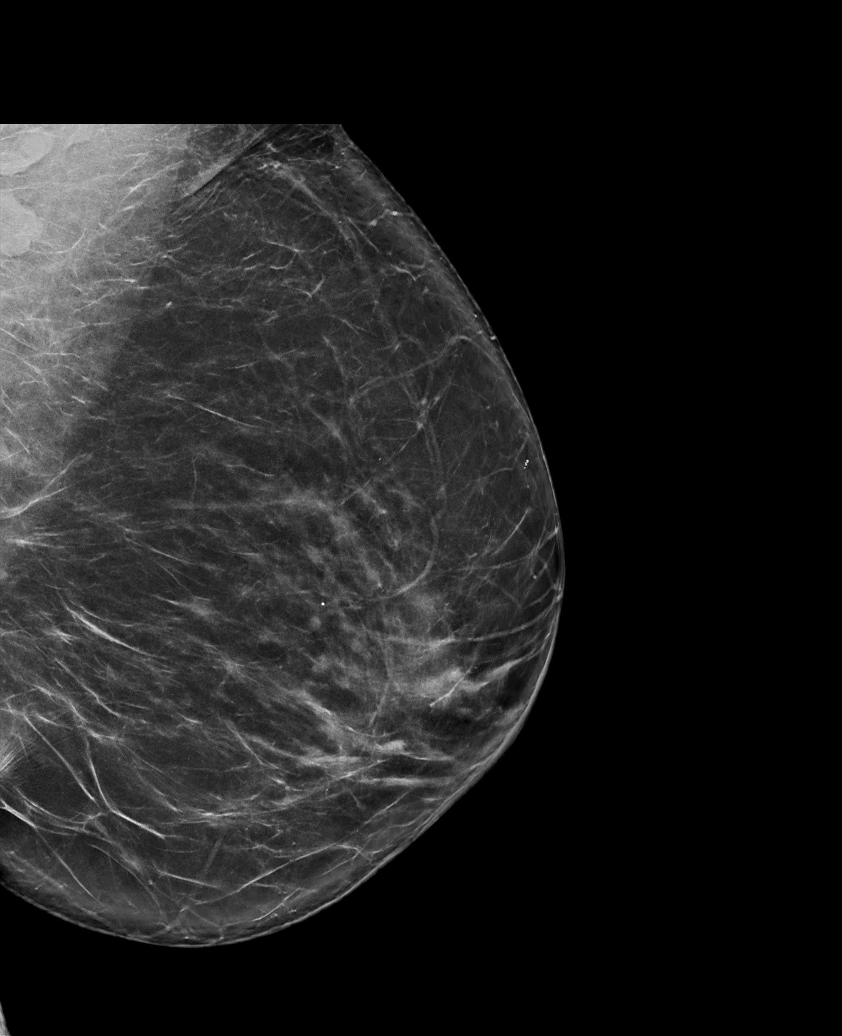

[R MLO synth-2D]
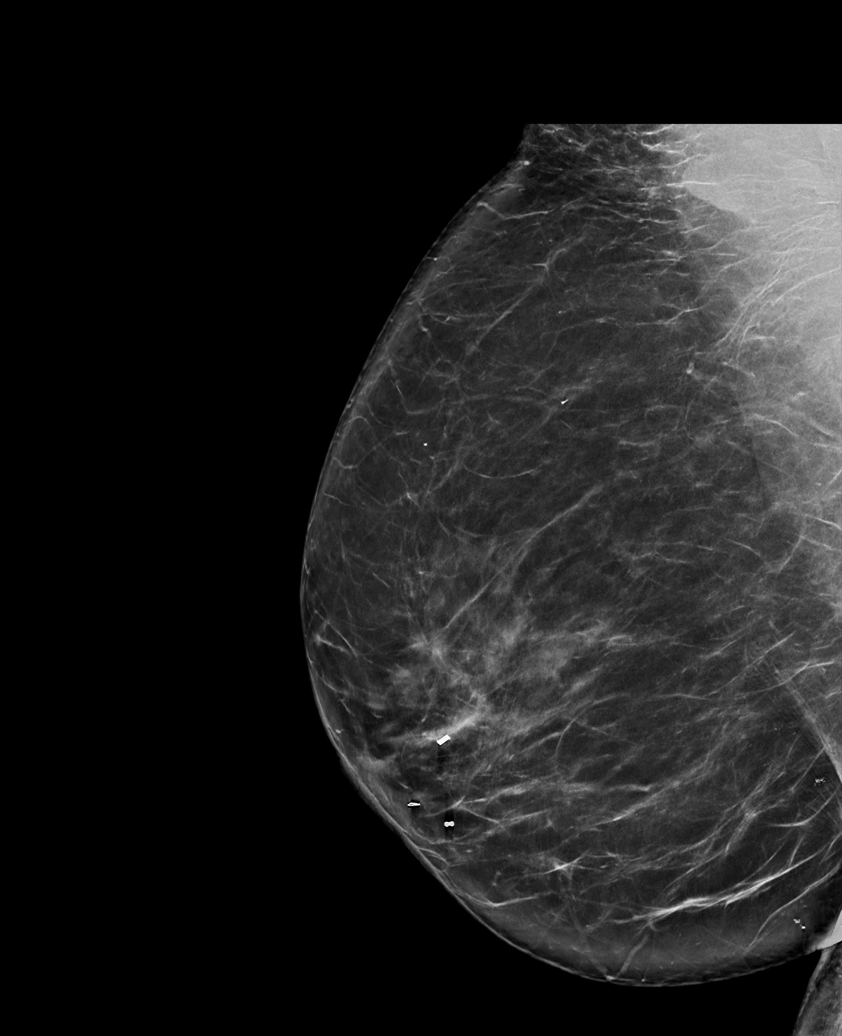

[R CC synth-2D]
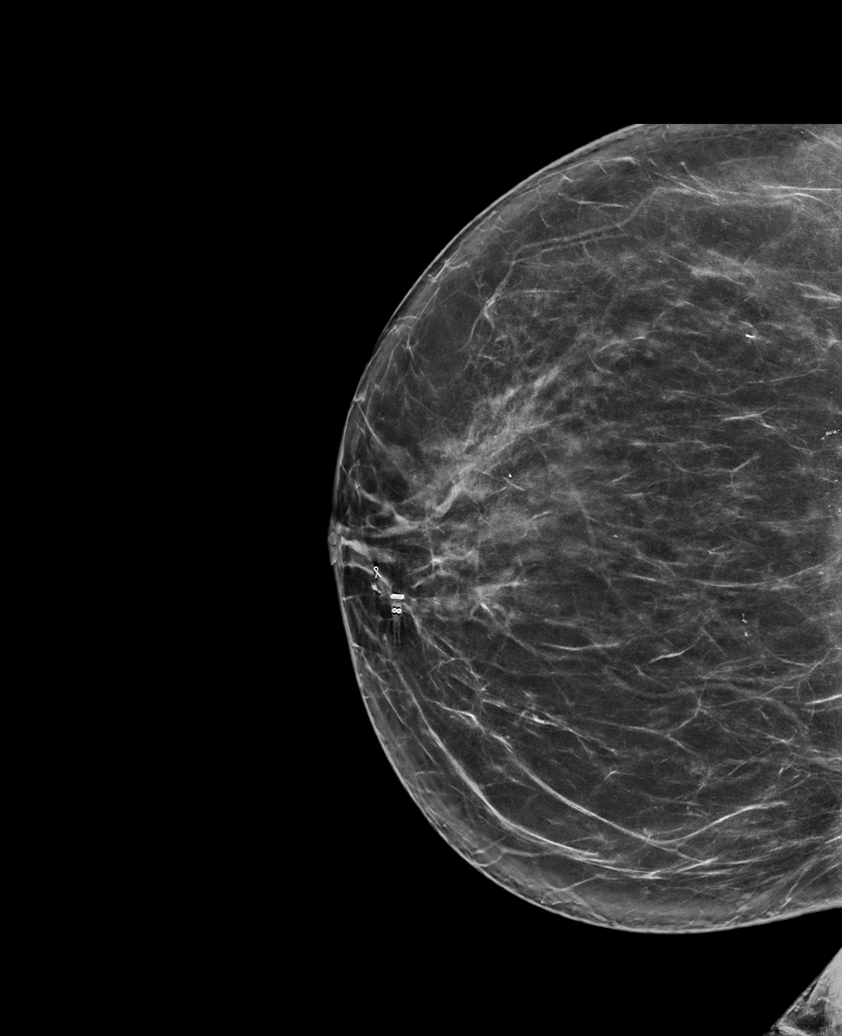

[L CC synth-2D]
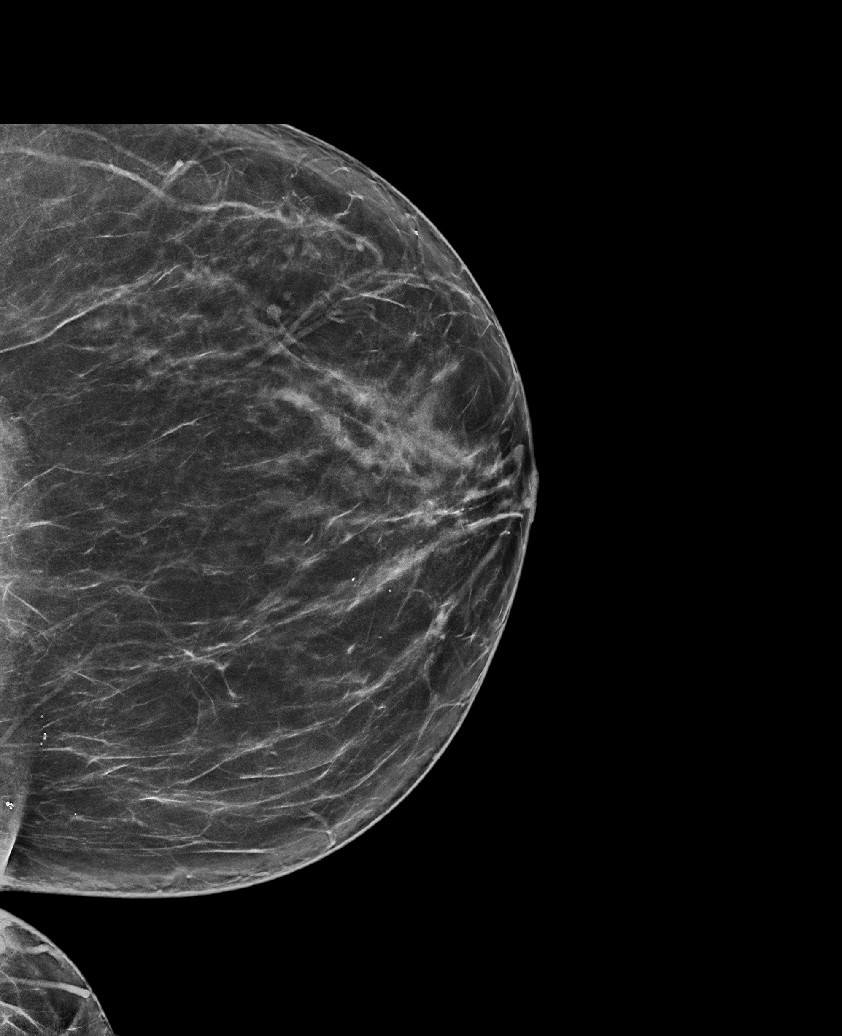

[R CC tomo · tomo slice 41/81.0]
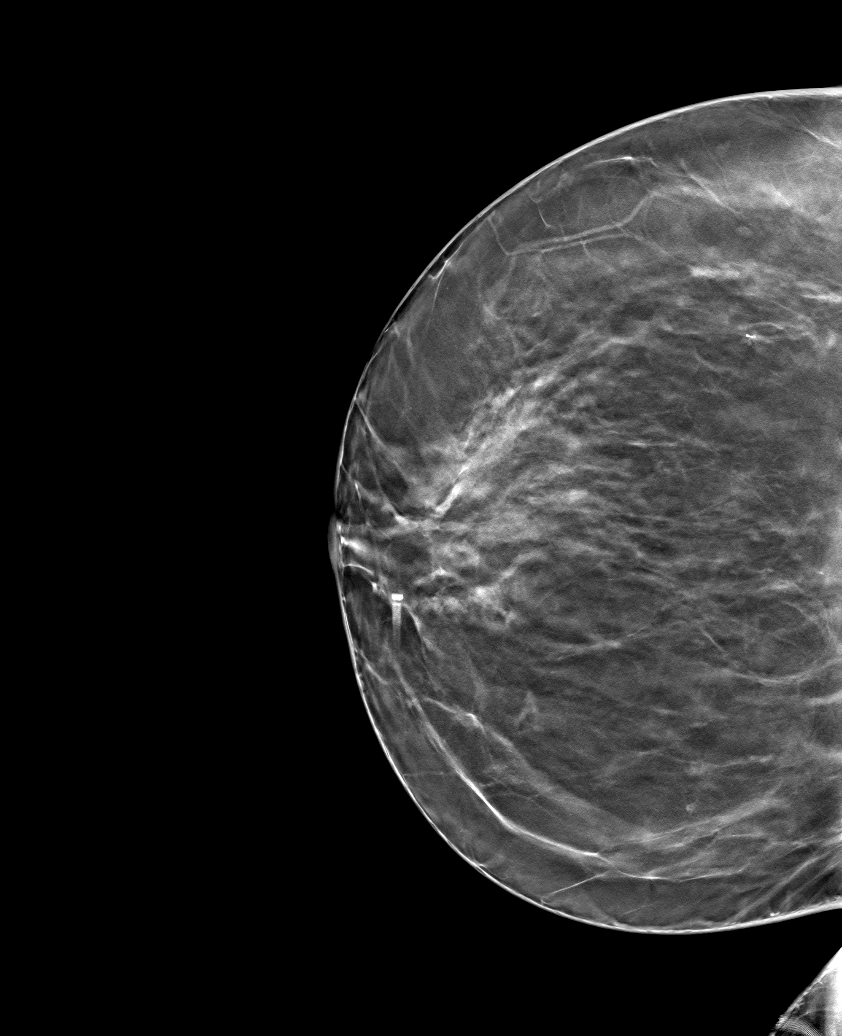

[L CC tomo · tomo slice 42/83.0]
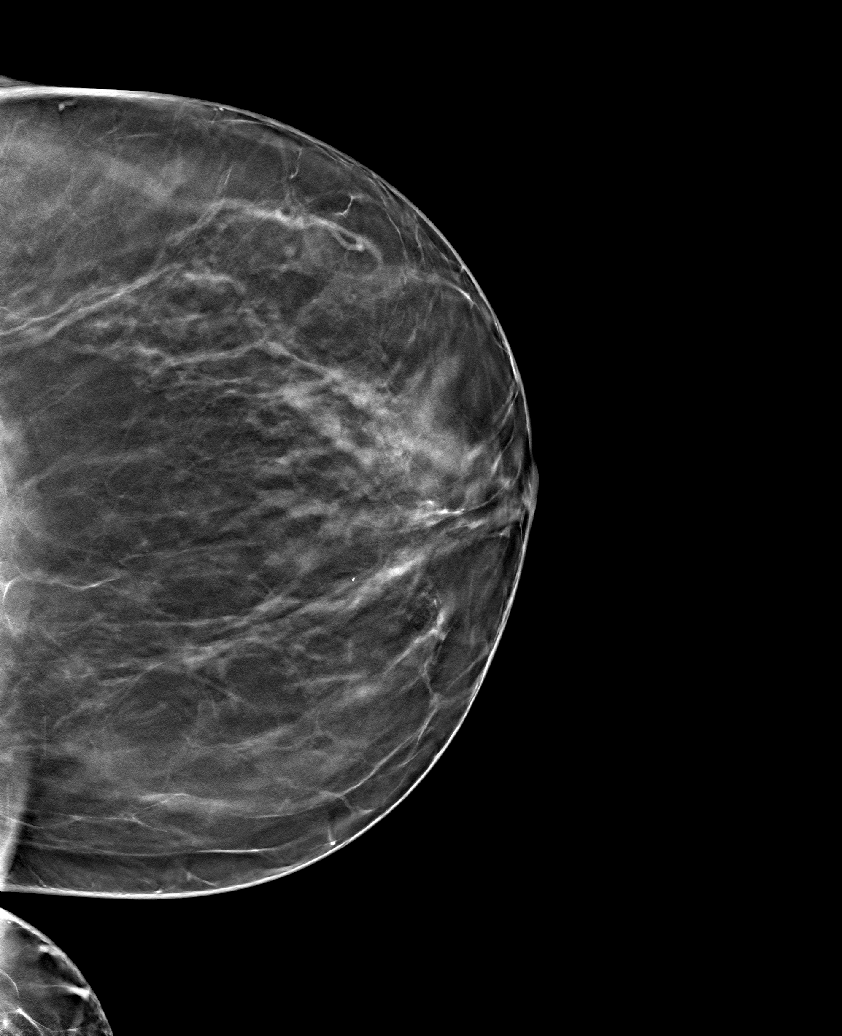

[6 of 14 positions shown; findings below may reference images not displayed]

ACR Breast Density Category b: There are scattered areas of
fibroglandular density.
FINDINGS: There are no findings suspicious for malignancy. Images were
processed with CAD.
IMPRESSION: No mammographic evidence of malignancy. A result letter of this
screening mammogram will be mailed directly to the patient.

RECOMMENDATION:
Screening mammogram in one year. (Code:[id number]-775)

BI-RADS CATEGORY  1: Negative.

## 2021-03-02 ENCOUNTER — Other Ambulatory Visit: Payer: Self-pay

## 2021-03-02 ENCOUNTER — Ambulatory Visit
Admission: RE | Admit: 2021-03-02 | Discharge: 2021-03-02 | Disposition: A | Discharge status: Home / Self Care | Payer: BLUE CROSS/BLUE SHIELD | Source: Ambulatory Visit | Attending: Family Medicine | Admitting: Family Medicine

## 2021-03-02 DIAGNOSIS — R928 Other abnormal and inconclusive findings on diagnostic imaging of breast: Secondary | ICD-10-CM

## 2021-03-02 DIAGNOSIS — R922 Inconclusive mammogram: Secondary | ICD-10-CM | POA: Diagnosis not present

## 2021-03-02 DIAGNOSIS — N641 Fat necrosis of breast: Secondary | ICD-10-CM | POA: Diagnosis not present

## 2021-05-06 ENCOUNTER — Ambulatory Visit: Payer: BC Managed Care – PPO | Admitting: Family Medicine

## 2021-05-06 ENCOUNTER — Other Ambulatory Visit (HOSPITAL_COMMUNITY)
Admission: RE | Admit: 2021-05-06 | Discharge: 2021-05-06 | Disposition: A | Discharge status: Home / Self Care | Payer: BC Managed Care – PPO | Source: Ambulatory Visit | Attending: Family Medicine | Admitting: Family Medicine

## 2021-05-06 ENCOUNTER — Other Ambulatory Visit: Payer: Self-pay

## 2021-05-06 ENCOUNTER — Encounter: Payer: Self-pay | Admitting: Family Medicine

## 2021-05-06 VITALS — BP 140/90 | HR 87 | Ht 62.0 in | Wt 227.0 lb

## 2021-05-06 DIAGNOSIS — Z124 Encounter for screening for malignant neoplasm of cervix: Secondary | ICD-10-CM | POA: Insufficient documentation

## 2021-05-06 DIAGNOSIS — Z0000 Encounter for general adult medical examination without abnormal findings: Secondary | ICD-10-CM

## 2021-05-06 DIAGNOSIS — I10 Essential (primary) hypertension: Secondary | ICD-10-CM | POA: Diagnosis not present

## 2021-05-06 DIAGNOSIS — Z1211 Encounter for screening for malignant neoplasm of colon: Secondary | ICD-10-CM | POA: Diagnosis not present

## 2021-05-06 DIAGNOSIS — R5383 Other fatigue: Secondary | ICD-10-CM

## 2021-05-06 DIAGNOSIS — R0683 Snoring: Secondary | ICD-10-CM

## 2021-05-06 NOTE — Progress Notes (Signed)
      Megan Morales     MRN: [id number]      DOB: [birth year]  HPI: Patient is in for annual physical exam. No other health concerns are expressed or addressed at the visit. Recent labs,  are reviewed. Immunization is reviewed , and  updated if needed.   PE: BP 140/90   Pulse 87   Ht '5\' 2"'$  (1.575 m)   Wt 227 lb (103 kg)   SpO2 96%   BMI 41.52 kg/m   Pleasant  female, alert and oriented x 3, in no cardio-pulmonary distress. Afebrile. HEENT No facial trauma or asymetry. Sinuses non tender.  Extra occullar muscles intact.. External ears normal, . Neck: supple, no adenopathy,JVD or thyromegaly.No bruits.  Chest: Clear to ascultation bilaterally.No crackles or wheezes. Non tender to palpation  Breast: No asymetry,no masses or lumps. No tenderness. No nipple discharge or inversion. No axillary or supraclavicular adenopathy  Cardiovascular system; Heart sounds normal,  S1 and  S2 ,no S3.  No murmur, or thrill. Apical beat not displaced Peripheral pulses normal.  Abdomen: Soft, non tender, no organomegaly or masses. No bruits. Bowel sounds normal. No guarding, tenderness or rebound.   GU: External genitalia normal female genitalia , normal female distribution of hair. No lesions. Urethral meatus normal in size, no  Prolapse, no lesions visibly  Present. Bladder non tender. Vagina pink and moist , with no visible lesions , discharge present . Adequate pelvic support no  cystocele or rectocele noted Cervix pink and appears healthy, no lesions or ulcerations noted, no discharge noted from os Uterus normal size, no adnexal masses, no cervical motion or adnexal tenderness.   Musculoskeletal exam: Full ROM of spine, hips , shoulders and knees. No deformity ,swelling or crepitus noted. No muscle wasting or atrophy.   Neurologic: Cranial nerves 2 to 12 intact. Power, tone ,sensation and reflexes normal throughout. No disturbance in gait. No  tremor.  Skin: Intact, no ulceration, erythema , scaling or rash noted. Pigmentation normal throughout  Psych; Normal mood and affect. Judgement and concentration normal   Assessment & Plan:   Annual physical exam Annual exam as documented. Counseling done  re healthy lifestyle involving commitment to 150 minutes exercise per week, heart healthy diet, and attaining healthy weight.The importance of adequate sleep also discussed. Regular seat belt use and home safety, is also discussed. Changes in health habits are decided on by the patient with goals and time frames  set for achieving them. Immunization and cancer screening needs are specifically addressed at this visit.   Snoring Snoring, obesity, uncontrolled HTn and fatigue , needs sleep eval

## 2021-05-06 NOTE — Patient Instructions (Addendum)
F/U in office , re evaluate blood pressure in 6 to 8 weeks, call if you need me sooner  Work excuse 8/4 to return 05/08/2021  You are referred for sleep study  TSH, chem 7 and EGFr today  Nurse please order cologuard  Blood pressure is high, please work on weight loss through smaller portion size and less sugar, also reduce salt in diet for blood pressure  Please get your covid booster  It is important that you exercise regularly at least 30 minutes 5 times a week. If you develop chest pain, have severe difficulty breathing, or feel very tired, stop exercising immediately and seek medical attention

## 2021-05-07 LAB — BMP8+EGFR
BUN/Creatinine Ratio: 17 (ref 12–28)
BUN: 16 mg/dL (ref 8–27)
CO2: 26 mmol/L (ref 20–29)
Calcium: 9.5 mg/dL (ref 8.7–10.3)
Chloride: 100 mmol/L (ref 96–106)
Creatinine, Ser: 0.94 mg/dL (ref 0.57–1.00)
Glucose: 86 mg/dL (ref 65–99)
Potassium: 3.9 mmol/L (ref 3.5–5.2)
Sodium: 141 mmol/L (ref 134–144)
eGFR: 68 mL/min/{1.73_m2} (ref >59)

## 2021-05-07 LAB — TSH: TSH: 1.2 u[IU]/mL (ref 0.450–4.500)

## 2021-05-10 DIAGNOSIS — R5383 Other fatigue: Secondary | ICD-10-CM | POA: Insufficient documentation

## 2021-05-10 DIAGNOSIS — R0683 Snoring: Secondary | ICD-10-CM | POA: Insufficient documentation

## 2021-05-10 LAB — CYTOLOGY - PAP
Adequacy: ABSENT
Comment: NEGATIVE
Diagnosis: NEGATIVE
High risk HPV: NEGATIVE

## 2021-05-10 NOTE — Assessment & Plan Note (Signed)

## 2021-05-10 NOTE — Assessment & Plan Note (Signed)
Snoring, obesity, uncontrolled HTn and fatigue , needs sleep eval

## 2021-05-10 NOTE — Assessment & Plan Note (Signed)
Reports significant fatigue and excessive work load with long hours. BP elevated at visit. ork excuse to return 05/08/2021

## 2021-05-19 NOTE — Progress Notes (Signed)
05/19/21-  18 yoF never smoker for sleep evaluation courtesy of Dr Tula Nakayama with concern of snoring. Medical problem list includes HTN, Morbid Obesity,  Epworth score-11 Body weight today-228 lbs Covid vax-3 Moderna BP 142/ 100 "had not taken BP med today". No sleep meds, 1 cup coffee  No ENT surgery, heart or lung problems; 2 brothers use CPAP. Complains of staying tired, loud snoring. Admits occasional wheeze and some DOE.   Prior to Admission medications   Medication Sig Start Date End Date Taking? Authorizing Provider  amLODipine (NORVASC) 10 MG tablet TAKE 1 TABLET(10 MG) BY MOUTH DAILY 06/01/20  Yes Fayrene Helper, MD  benazepril-hydrochlorthiazide (LOTENSIN HCT) 20-12.5 MG tablet Take two tablets by mouth once daily every morning for blood pressure 05/25/20  Yes Fayrene Helper, MD  ergocalciferol (VITAMIN D2) 1.25 MG (50000 UT) capsule Take 1 capsule (50,000 Units total) by mouth once a week. One capsule once weekly 05/27/20  Yes Fayrene Helper, MD  Multiple Vitamin (MULTIVITAMIN) capsule Take 1 capsule by mouth daily.   Yes [provider]  potassium chloride (KLOR-CON) 10 MEQ tablet TAKE 1 TABLET(10 MEQ) BY MOUTH DAILY 06/01/20  Yes Fayrene Helper, MD  Calcium Carbonate-Vitamin D 600-400 MG-UNIT tablet Take 1 tablet by mouth daily. Patient not taking: Reported on 05/20/2021    [provider]   Past Medical History:  Diagnosis Date   Hypertension    Past Surgical History:  Procedure Laterality Date   BREAST BIOPSY Right 03/26/2018   x2   BREAST BIOPSY Right 10/10/2018   KNEE ARTHROSCOPY WITH MEDIAL MENISECTOMY Left 04/30/2020   Procedure: KNEE ARTHROSCOPY WITH MEDIAL MENISECTOMY;  Surgeon: Carole Civil, MD;  Location: [facility];  Service: Orthopedics;  Laterality: Left;   MYOMECTOMY  2010   TUBAL LIGATION  1992   Family History  Problem Relation Age of Onset   Breast cancer Mother 14   Diabetes Father    Stroke Father     Hypertension Father    Cancer Brother        liver   Hypertension Brother    Colon cancer Neg Hx    Esophageal cancer Neg Hx    Pancreatic cancer Neg Hx    Rectal cancer Neg Hx    Stomach cancer Neg Hx    Social History   Socioeconomic History   Marital status: Married    Spouse name: Not on file   Number of children: Not on file   Years of education: Not on file   Highest education level: Not on file  Occupational History   Not on file  Tobacco Use   Smoking status: Never   Smokeless tobacco: Never  Vaping Use   Vaping Use: Never used  Substance and Sexual Activity   Alcohol use: No    Alcohol/week: 0.0 standard drinks   Drug use: No   Sexual activity: Not on file  Other Topics Concern   Not on file  Social History Narrative   Not on file   Social Determinants of Health   Financial Resource Strain: Not on file  Food Insecurity: Not on file  Transportation Needs: Not on file  Physical Activity: Not on file  Stress: Not on file  Social Connections: Not on file  Intimate Partner Violence: Not on file    ROS-see HPI   + = positive Constitutional:    weight loss, night sweats, fevers, chills, fatigue, lassitude. HEENT:    +headaches, difficulty swallowing, tooth/dental problems, sore throat,  sneezing, itching, ear ache, nasal congestion, post nasal drip, snoring CV:    chest pain, orthopnea, PND, swelling in lower extremities, anasarca,                                   dizziness, palpitations Resp:   shortness of breath with exertion or at rest.                productive cough,   non-productive cough, coughing up of blood.              change in color of mucus.  wheezing.   Skin:    rash or lesions. GI:  No-   heartburn, indigestion, abdominal pain, nausea, vomiting, diarrhea,                 change in bowel habits, loss of appetite GU: dysuria, change in color of urine, no urgency or frequency.   flank pain. MS:   joint pain, +stiffness, decreased range  of motion, back pain. Neuro-     nothing unusual Psych:  change in mood or affect.  depression or anxiety.   memory loss.   OBJ- Physical Exam General- Alert, Oriented, Affect-appropriate, Distress- none acute, + obesse Skin- rash-none, lesions- none, excoriation- none Lymphadenopathy- none Head- atraumatic            Eyes- Gross vision intact, PERRLA, conjunctivae and secretions clear            Ears- Hearing, canals-normal            Nose- Clear, no-Septal dev, mucus, polyps, erosion, perforation             Throat- Mallampati II I-IV, mucosa clear , drainage- none, tonsils- atrophic, + dentures Neck- flexible , trachea midline, no stridor , thyroid nl, carotid no bruit Chest - symmetrical excursion , unlabored           Heart/CV- RRR , no murmur , no gallop  , no rub, nl s1 s2                           - JVD- none , edema- none, stasis changes- none, varices- none           Lung- clear to P&A, wheeze-+mild, cough- none , dullness-none, rub- none           Chest wall-  Abd-  Br/ Gen/ Rectal- Not done, not indicated Extrem- cyanosis- none, clubbing, none, atrophy- none, strength- nl Neuro- grossly intact to observation

## 2021-05-20 ENCOUNTER — Other Ambulatory Visit: Payer: Self-pay

## 2021-05-20 ENCOUNTER — Ambulatory Visit (INDEPENDENT_AMBULATORY_CARE_PROVIDER_SITE_OTHER): Payer: BC Managed Care – PPO | Admitting: Internal Medicine

## 2021-05-20 ENCOUNTER — Encounter: Payer: Self-pay | Admitting: Internal Medicine

## 2021-05-20 VITALS — BP 142/100 | HR 93 | Temp 97.9°F | Ht 61.0 in | Wt 228.4 lb

## 2021-05-20 DIAGNOSIS — I10 Essential (primary) hypertension: Secondary | ICD-10-CM | POA: Diagnosis not present

## 2021-05-20 DIAGNOSIS — R0683 Snoring: Secondary | ICD-10-CM | POA: Diagnosis not present

## 2021-05-20 NOTE — Patient Instructions (Signed)
Order- schedule split night sleep study at sleep disorders center [facility]) on [facility] Dx Snoring  Please call us for results and recommendations about 2 weeks after your sleep study

## 2021-05-23 NOTE — Assessment & Plan Note (Signed)
Complicating wheeze, obesity and hypertension increase concern Plan- schedule sleep study

## 2021-05-23 NOTE — Assessment & Plan Note (Addendum)
Emphasized importance of compliance with BP meds- she had not taken by time of this appointment.

## 2021-05-26 ENCOUNTER — Telehealth: Payer: Self-pay | Admitting: Internal Medicine

## 2021-05-27 NOTE — Telephone Encounter (Signed)
I have called the patient and she stated that someone had called her to schedule her sleep study.  She did get this scheduled so nothing further is needed.

## 2021-06-17 ENCOUNTER — Telehealth: Payer: Self-pay

## 2021-06-17 DIAGNOSIS — R0683 Snoring: Secondary | ICD-10-CM

## 2021-06-17 NOTE — Telephone Encounter (Signed)
Deneise Lever, MD  P [facility] Triage Pool Please change order from split night sleep study to home test per insurance requirement   Order for HST has been placed.

## 2021-06-17 NOTE — Telephone Encounter (Signed)
-----   Message from Joellen Jersey sent at 06/17/2021  9:19 AM EDT ----- Regarding: split night Insurance denied the split night do you want a home study if so I need an order thanks [facility]

## 2021-06-17 NOTE — Telephone Encounter (Signed)
Dr. Annamaria Boots Insurance denied the split night would you like me to order home sleep study?  Please advise

## 2021-06-24 ENCOUNTER — Other Ambulatory Visit: Payer: Self-pay

## 2021-06-24 ENCOUNTER — Ambulatory Visit: Payer: BC Managed Care – PPO | Admitting: Family Medicine

## 2021-06-24 ENCOUNTER — Encounter: Payer: Self-pay | Admitting: Family Medicine

## 2021-06-24 VITALS — BP 132/88 | HR 77 | Temp 98.1°F | Resp 20 | Ht 61.0 in | Wt 225.0 lb

## 2021-06-24 DIAGNOSIS — Z23 Encounter for immunization: Secondary | ICD-10-CM

## 2021-06-24 DIAGNOSIS — R7303 Prediabetes: Secondary | ICD-10-CM

## 2021-06-24 DIAGNOSIS — E876 Hypokalemia: Secondary | ICD-10-CM

## 2021-06-24 DIAGNOSIS — Z13220 Encounter for screening for lipoid disorders: Secondary | ICD-10-CM

## 2021-06-24 DIAGNOSIS — I10 Essential (primary) hypertension: Secondary | ICD-10-CM

## 2021-06-24 DIAGNOSIS — R5383 Other fatigue: Secondary | ICD-10-CM

## 2021-06-24 DIAGNOSIS — E559 Vitamin D deficiency, unspecified: Secondary | ICD-10-CM

## 2021-06-24 MED ORDER — POTASSIUM CHLORIDE ER 10 MEQ PO TBCR: EXTENDED_RELEASE_TABLET | ORAL | 2 refills | Status: DC

## 2021-06-24 MED ORDER — AMLODIPINE BESYLATE 10 MG PO TABS
ORAL_TABLET | ORAL | 2 refills | Status: DC
Start: 2021-06-24 — End: 2023-04-18

## 2021-06-24 MED ORDER — BENAZEPRIL-HYDROCHLOROTHIAZIDE 20-12.5 MG PO TABS
ORAL_TABLET | ORAL | 2 refills | Status: DC
Start: 2021-06-24 — End: 2022-11-21

## 2021-06-24 NOTE — Patient Instructions (Addendum)
F/U in 4 months, call if you need me before  No medication changes  Please work on lifestyle and weight loss  Flu vaccine today  Nurse please refill foer 90 days x 2 all of listed meds    Please send in your cologuard test  Fasting lipid, cmp and EGFr, hBa1C, CBc and vit D  Thanks for choosing [facility], we consider it a privelige to serve you.

## 2021-06-28 ENCOUNTER — Encounter: Payer: Self-pay | Admitting: Family Medicine

## 2021-06-28 NOTE — Progress Notes (Signed)
Megan Morales     MRN: [id number]      DOB: 01/29/1956   HPI Megan Morales is here for follow up and re-evaluation of chronic medical conditions, medication management and review of any available recent lab and radiology data.  Preventive health is updated, specifically  Cancer screening and Immunization.   Questions or concerns regarding consultations or procedures which the PT has had in the interim are  addressed. The PT denies any adverse reactions to current medications since the last visit.  There are no new concerns.  There are no specific complaints   ROS Denies recent fever or chills. Denies sinus pressure, nasal congestion, ear pain or sore throat. Denies chest congestion, productive cough or wheezing. Denies chest pains, palpitations and leg swelling Denies abdominal pain, nausea, vomiting,diarrhea or constipation.   Denies dysuria, frequency, hesitancy or incontinence. Denies joint pain, swelling and limitation in mobility. Denies headaches, seizures, numbness, or tingling. Denies depression, anxiety or insomnia. Denies skin break down or rash.   PE  BP 132/88   Pulse 77   Temp 98.1 F (36.7 C)   Resp 20   Ht 5\' 1"  (1.549 m)   Wt 225 lb (102.1 kg)   SpO2 98%   BMI 42.51 kg/m   Patient alert and oriented and in no cardiopulmonary distress.  HEENT: No facial asymmetry, EOMI,     Neck supple .  Chest: Clear to auscultation bilaterally.  CVS: S1, S2 no murmurs, no S3.Regular rate.  ABD: Soft non tender.   Ext: No edema  MS: Adequate ROM spine, shoulders, hips and knees.  Skin: Intact, no ulcerations or rash noted.  Psych: Good eye contact, normal affect. Memory intact not anxious or depressed appearing.  CNS: CN 2-12 intact, power,  normal throughout.no focal deficits noted.   Assessment & Plan ESSENTIAL HYPERTENSION, BENIGN Controlled, no change in medication DASH diet and commitment to daily physical activity for a minimum of 30 minutes  discussed and encouraged, as a part of hypertension management. The importance of attaining a healthy weight is also discussed.  BP/Weight 06/24/2021 05/20/2021 05/06/2021 11/06/2020 06/01/2020 9/47/6546 5/0/3546  Systolic BP 568 127 517 001 - 749 -  Diastolic BP 88 449 90 88 - 90 -  Wt. (Lbs) 225 228.4 227 221 218 221.08 220  BMI 42.51 43.16 41.52 41.76 41.19 41.77 41.57       Need for influenza vaccination After obtaining informed consent, the vaccine is  administered , with no adverse effect noted at the time of administration.   Morbid obesity ([place])  Patient re-educated about  the importance of commitment to a  minimum of 150 minutes of exercise per week as able.  The importance of healthy food choices with portion control discussed, as well as eating regularly and within a 12 hour window most days. The need to choose "clean , green" food 50 to 75% of the time is discussed, as well as to make water the primary drink and set a goal of 64 ounces water daily.    Weight /BMI 06/24/2021 05/20/2021 05/06/2021  WEIGHT 225 lb 228 lb 6.4 oz 227 lb  HEIGHT 5\' 1"  5\' 1"  5\' 2"   BMI 42.51 kg/m2 43.16 kg/m2 41.52 kg/m2      Prediabetes Patient educated about the importance of limiting  Carbohydrate intake , the need to commit to daily physical activity for a minimum of 30 minutes , and to commit weight loss. The fact that changes in all these areas will reduce  or eliminate all together the development of diabetes is stressed.  Updated lab needed at/ before next visit.  Diabetic Labs Latest Ref Rng & Units 05/06/2021 11/06/2020 05/25/2020 04/28/2020 05/07/2019  HbA1c 4.8 - 5.6 % - 5.7(H) - - 5.8(H)  Chol 100 - 199 mg/dL - 172 204(H) - 187  HDL >39 mg/dL - 51 56 - 51  Calc LDL 0 - 99 mg/dL - 111(H) 135(H) - 120(H)  Triglycerides 0 - 149 mg/dL - 51 73 - 66  Creatinine 0.57 - 1.00 mg/dL 0.94 0.94 - 0.83 0.98   BP/Weight 06/24/2021 05/20/2021 05/06/2021 11/06/2020 06/01/2020 03/30/3661 06/06/7653  Systolic BP 650  354 656 812 - 751 -  Diastolic BP 88 700 90 88 - 90 -  Wt. (Lbs) 225 228.4 227 221 218 221.08 220  BMI 42.51 43.16 41.52 41.76 41.19 41.77 41.57   No flowsheet data found.

## 2021-06-28 NOTE — Assessment & Plan Note (Signed)
Patient educated about the importance of limiting  Carbohydrate intake , the need to commit to daily physical activity for a minimum of 30 minutes , and to commit weight loss. The fact that changes in all these areas will reduce or eliminate all together the development of diabetes is stressed.  Updated lab needed at/ before next visit.  Diabetic Labs Latest Ref Rng & Units 05/06/2021 11/06/2020 05/25/2020 04/28/2020 05/07/2019  HbA1c 4.8 - 5.6 % - 5.7(H) - - 5.8(H)  Chol 100 - 199 mg/dL - 172 204(H) - 187  HDL >39 mg/dL - 51 56 - 51  Calc LDL 0 - 99 mg/dL - 111(H) 135(H) - 120(H)  Triglycerides 0 - 149 mg/dL - 51 73 - 66  Creatinine 0.57 - 1.00 mg/dL 0.94 0.94 - 0.83 0.98   BP/Weight 06/24/2021 05/20/2021 05/06/2021 11/06/2020 06/01/2020 0/73/7106 11/09/9483  Systolic BP 462 703 500 938 - 182 -  Diastolic BP 88 993 90 88 - 90 -  Wt. (Lbs) 225 228.4 227 221 218 221.08 220  BMI 42.51 43.16 41.52 41.76 41.19 41.77 41.57   No flowsheet data found.

## 2021-06-28 NOTE — Assessment & Plan Note (Signed)
After obtaining informed consent, the vaccine is  administered , with no adverse effect noted at the time of administration.  

## 2021-06-28 NOTE — Assessment & Plan Note (Signed)
Controlled, no change in medication DASH diet and commitment to daily physical activity for a minimum of 30 minutes discussed and encouraged, as a part of hypertension management. The importance of attaining a healthy weight is also discussed.  BP/Weight 06/24/2021 05/20/2021 05/06/2021 11/06/2020 06/01/2020 3/74/8270 04/09/6753  Systolic BP 492 010 071 219 - 758 -  Diastolic BP 88 832 90 88 - 90 -  Wt. (Lbs) 225 228.4 227 221 218 221.08 220  BMI 42.51 43.16 41.52 41.76 41.19 41.77 41.57

## 2021-06-28 NOTE — Assessment & Plan Note (Signed)
  Patient re-educated about  the importance of commitment to a  minimum of 150 minutes of exercise per week as able.  The importance of healthy food choices with portion control discussed, as well as eating regularly and within a 12 hour window most days. The need to choose "clean , green" food 50 to 75% of the time is discussed, as well as to make water the primary drink and set a goal of 64 ounces water daily.    Weight /BMI 06/24/2021 05/20/2021 05/06/2021  WEIGHT 225 lb 228 lb 6.4 oz 227 lb  HEIGHT 5\' 1"  5\' 1"  5\' 2"   BMI 42.51 kg/m2 43.16 kg/m2 41.52 kg/m2

## 2021-07-15 ENCOUNTER — Encounter (HOSPITAL_BASED_OUTPATIENT_CLINIC_OR_DEPARTMENT_OTHER): Payer: BC Managed Care – PPO | Admitting: Internal Medicine

## 2021-08-30 ENCOUNTER — Ambulatory Visit: Payer: BC Managed Care – PPO | Admitting: Nurse Practitioner

## 2021-08-30 ENCOUNTER — Other Ambulatory Visit: Payer: Self-pay

## 2021-08-30 ENCOUNTER — Encounter: Payer: Self-pay | Admitting: Nurse Practitioner

## 2021-08-30 ENCOUNTER — Ambulatory Visit: Payer: BC Managed Care – PPO

## 2021-08-30 DIAGNOSIS — J029 Acute pharyngitis, unspecified: Secondary | ICD-10-CM | POA: Diagnosis not present

## 2021-08-30 DIAGNOSIS — R051 Acute cough: Secondary | ICD-10-CM | POA: Diagnosis not present

## 2021-08-30 LAB — POCT INFLUENZA A/B
Influenza A, POC: NEGATIVE
Influenza B, POC: NEGATIVE

## 2021-08-30 LAB — POCT RAPID STREP A (OFFICE): Rapid Strep A Screen: NEGATIVE

## 2021-08-30 MED ORDER — BENZONATATE 100 MG PO CAPS: 100.0000 mg | ORAL_CAPSULE | Freq: Two times a day (BID) | ORAL | 0 refills | Status: DC | PRN

## 2021-08-30 NOTE — Assessment & Plan Note (Signed)
Check for flu. Tessalon 100mg  BID as needed for cough.  Drink plenty of warm water to stay hydrated

## 2021-08-30 NOTE — Progress Notes (Addendum)
Virtual Visit via Telephone Note  I connected with Anders Simmonds on 08/30/21 at  3:40 PM EST by telephone and verified that I am speaking with the correct person using two identifiers.I spent 9 minutes talking to the pt and reviewing her chart.   Location: Patient: home Provider: office   I discussed the limitations, risks, security and privacy concerns of performing an evaluation and management service by telephone and the availability of in person appointments. I also discussed with the patient that there may be a patient responsible charge related to this service. The patient expressed understanding and agreed to proceed.   History of Present Illness: PT c/o cough with yellowish sputum, sore throat  that started on 11/25. She denise trouble swallowing, fever, chills, aches, sob, wheezing , cp. She stated that her head hurts when she coughs , mucinex helps her cough.  She has not been around anyone sick, except her grandson has cold.  She has had her COVID vaccines and Flu shot.   Observations/Objective:   Assessment and Plan: Swab for flu and strep A   Follow Up Instructions:  Flu and strep negative Tessalon 100mg  twice daily as needed for cough. Drink plenty of warm fluids. Use tylenol as needed for head ache.    I discussed the assessment and treatment plan with the patient. The patient was provided an opportunity to ask questions and all were answered. The patient agreed with the plan and demonstrated an understanding of the instructions.   The patient was advised to call back or seek an in-person evaluation if the symptoms worsen or if the condition fails to improve as anticipated.

## 2021-08-30 NOTE — Assessment & Plan Note (Signed)
Rapid strep ordered.

## 2021-09-17 ENCOUNTER — Telehealth: Payer: Self-pay | Admitting: Internal Medicine

## 2021-09-17 NOTE — Telephone Encounter (Signed)
ATC patient about her appt on Monday, unable to leave message due to VM being full. She has not had her sleep study done yet and needs to done first before being seen.   If patient calls back please let [facility]'s know and also reschedule her appt.

## 2021-09-20 ENCOUNTER — Ambulatory Visit: Payer: BC Managed Care – PPO

## 2021-09-20 ENCOUNTER — Ambulatory Visit: Payer: BC Managed Care – PPO | Admitting: Internal Medicine

## 2021-09-20 ENCOUNTER — Other Ambulatory Visit: Payer: Self-pay

## 2021-09-20 DIAGNOSIS — R0683 Snoring: Secondary | ICD-10-CM

## 2021-10-06 ENCOUNTER — Telehealth: Payer: Self-pay | Admitting: Internal Medicine

## 2021-10-06 NOTE — Telephone Encounter (Signed)
Spoke to patient, who is requesting sleep study results.    PCC's sleep study has not been scanned into chart.

## 2021-10-06 NOTE — Telephone Encounter (Signed)
This was downloaded on 09/21/21 and put into Dr Janee Morn cabinet I dont have any to scan in here

## 2021-10-07 DIAGNOSIS — R0683 Snoring: Secondary | ICD-10-CM | POA: Diagnosis not present

## 2021-10-07 NOTE — Telephone Encounter (Signed)
Results have been loaded into Epic

## 2021-10-07 NOTE — Telephone Encounter (Signed)
Dr. Young, please advise. Thanks!  

## 2021-10-08 NOTE — Telephone Encounter (Signed)
Patient checking on sleep test results. Patient phone number is [phone].

## 2021-10-08 NOTE — Telephone Encounter (Signed)
Sleep study- she has some apnea events, right at the upper limit of normal. We usually manage this by suggesting she try to sleep off the flat of her back and try to keep her weight down. I can see her again if she would like to discuss, otherwise can cancel f/u appointment.

## 2021-10-08 NOTE — Telephone Encounter (Signed)
Dr. Annamaria Boots, please advise on the results of sleep study as pt has called back again checking on them.

## 2021-10-11 NOTE — Telephone Encounter (Signed)
Attempted to contact patient. Voicemail is full.

## 2021-10-13 NOTE — Telephone Encounter (Signed)
Spoke with patient regarding sleep study results. They verbalized understanding. No further questions.  °Nothing further needed at this time.  °

## 2021-10-28 ENCOUNTER — Ambulatory Visit: Payer: BC Managed Care – PPO | Admitting: Family Medicine

## 2021-10-28 ENCOUNTER — Other Ambulatory Visit: Payer: Self-pay

## 2021-10-28 ENCOUNTER — Encounter: Payer: Self-pay | Admitting: Family Medicine

## 2021-10-28 VITALS — BP 130/82 | HR 94 | Ht 61.0 in | Wt 230.0 lb

## 2021-10-28 DIAGNOSIS — R7303 Prediabetes: Secondary | ICD-10-CM | POA: Diagnosis not present

## 2021-10-28 DIAGNOSIS — Z1231 Encounter for screening mammogram for malignant neoplasm of breast: Secondary | ICD-10-CM

## 2021-10-28 DIAGNOSIS — Z1211 Encounter for screening for malignant neoplasm of colon: Secondary | ICD-10-CM

## 2021-10-28 DIAGNOSIS — I10 Essential (primary) hypertension: Secondary | ICD-10-CM | POA: Diagnosis not present

## 2021-10-28 DIAGNOSIS — E559 Vitamin D deficiency, unspecified: Secondary | ICD-10-CM | POA: Diagnosis not present

## 2021-10-28 DIAGNOSIS — Z13220 Encounter for screening for lipoid disorders: Secondary | ICD-10-CM | POA: Diagnosis not present

## 2021-10-28 MED ORDER — WEGOVY 0.5 MG/0.5ML ~~LOC~~ SOAJ: 0.5000 mg | SUBCUTANEOUS | 1 refills | Status: DC

## 2021-10-28 MED ORDER — WEGOVY 0.25 MG/0.5ML ~~LOC~~ SOAJ: 0.2500 mg | SUBCUTANEOUS | 0 refills | Status: DC

## 2021-10-28 NOTE — Assessment & Plan Note (Signed)
DASH diet and commitment to daily physical activity for a minimum of 30 minutes discussed and encouraged, as a part of hypertension management. The importance of attaining a healthy weight is also discussed.  BP/Weight 10/28/2021 06/24/2021 05/20/2021 05/06/2021 11/06/2020 06/01/2020 02/27/7823  Systolic BP 235 361 443 154 008 - 676  Diastolic BP 82 88 195 90 88 - 90  Wt. (Lbs) 230 225 228.4 227 221 218 221.08  BMI 43.46 42.51 43.16 41.52 41.76 41.19 41.77     Controlled, no change in medication

## 2021-10-28 NOTE — Progress Notes (Signed)
Megan Morales     MRN: [id number]      DOB: Nov 05, 1955   HPI Megan Morales is here for follow up and re-evaluation of chronic medical conditions, medication management and review of any available recent lab and radiology data.  Preventive health is updated, specifically  Cancer screening and Immunization.  Mammogram to be scheduled and cologuard to be returned  The PT denies any adverse reactions to current medications since the last visit.  C/o ongoing weight gain and poor control of diet with weight gain , wants help   ROS Denies recent fever or chills. Denies sinus pressure, nasal congestion, ear pain or sore throat. Denies chest congestion, productive cough or wheezing. Denies chest pains, palpitations and leg swelling Denies abdominal pain, nausea, vomiting,diarrhea or constipation.   Denies dysuria, frequency, hesitancy or incontinence. Denies joint pain, swelling and limitation in mobility. Denies headaches, seizures, numbness, or tingling. Denies depression, anxiety or insomnia. Denies skin break down or rash.   PE  BP 130/82    Pulse 94    Ht 5\' 1"  (1.549 m)    Wt 230 lb (104.3 kg)    SpO2 94%    BMI 43.46 kg/m   Patient alert and oriented and in no cardiopulmonary distress.  HEENT: No facial asymmetry, EOMI,     Neck supple .  Chest: Clear to auscultation bilaterally.  CVS: S1, S2 no murmurs, no S3.Regular rate.  ABD: Soft non tender.   Ext: No edema  MS: Adequate ROM spine, shoulders, hips and knees.  Skin: Intact, no ulcerations or rash noted.  Psych: Good eye contact, normal affect. Memory intact not anxious or depressed appearing.  CNS: CN 2-12 intact, power,  normal throughout.no focal deficits noted.   Assessment & Plan  ESSENTIAL HYPERTENSION, BENIGN DASH diet and commitment to daily physical activity for a minimum of 30 minutes discussed and encouraged, as a part of hypertension management. The importance of attaining a healthy weight is also  discussed.  BP/Weight 10/28/2021 06/24/2021 05/20/2021 05/06/2021 11/06/2020 06/01/2020 1/44/8185  Systolic BP 631 497 026 378 588 - 502  Diastolic BP 82 88 774 90 88 - 90  Wt. (Lbs) 230 225 228.4 227 221 218 221.08  BMI 43.46 42.51 43.16 41.52 41.76 41.19 41.77     Controlled, no change in medication   Morbid obesity ([place])  Patient re-educated about  the importance of commitment to a  minimum of 150 minutes of exercise per week as able.  The importance of healthy food choices with portion control discussed, as well as eating regularly and within a 12 hour window most days. The need to choose "clean , green" food 50 to 75% of the time is discussed, as well as to make water the primary drink and set a goal of 64 ounces water daily.    Weight /BMI 10/28/2021 06/24/2021 05/20/2021  WEIGHT 230 lb 225 lb 228 lb 6.4 oz  HEIGHT 5\' 1"  5\' 1"  5\' 1"   BMI 43.46 kg/m2 42.51 kg/m2 43.16 kg/m2      Prediabetes Patient educated about the importance of limiting  Carbohydrate intake , the need to commit to daily physical activity for a minimum of 30 minutes , and to commit weight loss. The fact that changes in all these areas will reduce or eliminate all together the development of diabetes is stressed.  Updated lab needed at/ before next visit.   Diabetic Labs Latest Ref Rng & Units 05/06/2021 11/06/2020 05/25/2020 04/28/2020 05/07/2019  HbA1c 4.8 -  5.6 % - 5.7(H) - - 5.8(H)  Chol 100 - 199 mg/dL - 172 204(H) - 187  HDL >39 mg/dL - 51 56 - 51  Calc LDL 0 - 99 mg/dL - 111(H) 135(H) - 120(H)  Triglycerides 0 - 149 mg/dL - 51 73 - 66  Creatinine 0.57 - 1.00 mg/dL 0.94 0.94 - 0.83 0.98   BP/Weight 10/28/2021 06/24/2021 05/20/2021 05/06/2021 11/06/2020 06/01/2020 3/43/5686  Systolic BP 168 372 902 111 552 - 080  Diastolic BP 82 88 223 90 88 - 90  Wt. (Lbs) 230 225 228.4 227 221 218 221.08  BMI 43.46 42.51 43.16 41.52 41.76 41.19 41.77   No flowsheet data found.    Vitamin D deficiency Updated lab needed at/  before next visit.

## 2021-10-28 NOTE — Assessment & Plan Note (Signed)
Patient educated about the importance of limiting  Carbohydrate intake , the need to commit to daily physical activity for a minimum of 30 minutes , and to commit weight loss. The fact that changes in all these areas will reduce or eliminate all together the development of diabetes is stressed.  Updated lab needed at/ before next visit.   Diabetic Labs Latest Ref Rng & Units 05/06/2021 11/06/2020 05/25/2020 04/28/2020 05/07/2019  HbA1c 4.8 - 5.6 % - 5.7(H) - - 5.8(H)  Chol 100 - 199 mg/dL - 172 204(H) - 187  HDL >39 mg/dL - 51 56 - 51  Calc LDL 0 - 99 mg/dL - 111(H) 135(H) - 120(H)  Triglycerides 0 - 149 mg/dL - 51 73 - 66  Creatinine 0.57 - 1.00 mg/dL 0.94 0.94 - 0.83 0.98   BP/Weight 10/28/2021 06/24/2021 05/20/2021 05/06/2021 11/06/2020 06/01/2020 11/08/154  Systolic BP 153 794 327 614 709 - 295  Diastolic BP 82 88 747 90 88 - 90  Wt. (Lbs) 230 225 228.4 227 221 218 221.08  BMI 43.46 42.51 43.16 41.52 41.76 41.19 41.77   No flowsheet data found.

## 2021-10-28 NOTE — Patient Instructions (Addendum)
F/U in 3 months, cal if you need me before  Labs today lipid, cmp and EGFr, TSH, hBA1C, CBC and Vit D  Please send in cologuard test kit, overdue  Please sched mammogram  at checkout , [facility], morning appt.  New med for weight loss is prescribed, if not covered will send in phentermine half tab  It is important that you exercise regularly at least 30 minutes 5 times a week. If you develop chest pain, have severe difficulty breathing, or feel very tired, stop exercising immediately and seek medical attention   Think about what you will eat, plan ahead. Choose " clean, green, fresh or frozen" over canned, processed or packaged foods which are more sugary, salty and fatty. 70 to 75% of food eaten should be vegetables and fruit. Three meals at set times with snacks allowed between meals, but they must be fruit or vegetables. Aim to eat over a 12 hour period , example 7 am to 7 pm, and STOP after  your last meal of the day. Drink water,generally about 64 ounces per day, no other drink is as healthy. Fruit juice is best enjoyed in a healthy way, by EATING the fruit.

## 2021-10-28 NOTE — Assessment & Plan Note (Signed)
Updated lab needed at/ before next visit.   

## 2021-10-28 NOTE — Assessment & Plan Note (Signed)
°  Patient re-educated about  the importance of commitment to a  minimum of 150 minutes of exercise per week as able.  The importance of healthy food choices with portion control discussed, as well as eating regularly and within a 12 hour window most days. The need to choose "clean , green" food 50 to 75% of the time is discussed, as well as to make water the primary drink and set a goal of 64 ounces water daily.    Weight /BMI 10/28/2021 06/24/2021 05/20/2021  WEIGHT 230 lb 225 lb 228 lb 6.4 oz  HEIGHT 5\' 1"  5\' 1"  5\' 1"   BMI 43.46 kg/m2 42.51 kg/m2 43.16 kg/m2

## 2021-10-29 LAB — CBC
Hematocrit: 42.2 % (ref 34.0–46.6)
Hemoglobin: 13.7 g/dL (ref 11.1–15.9)
MCH: 29.8 pg (ref 26.6–33.0)
MCHC: 32.5 g/dL (ref 31.5–35.7)
MCV: 92 fL (ref 79–97)
Platelets: 221 10*3/uL (ref 150–450)
RBC: 4.6 x10E6/uL (ref 3.77–5.28)
RDW: 13.5 % (ref 11.7–15.4)
WBC: 5.4 10*3/uL (ref 3.4–10.8)

## 2021-10-29 LAB — LIPID PANEL
Chol/HDL Ratio: 3.4 ratio (ref 0.0–4.4)
Cholesterol, Total: 195 mg/dL (ref 100–199)
HDL: 57 mg/dL (ref >39)
LDL Chol Calc (NIH): 126 mg/dL — ABNORMAL HIGH (ref 0–99)
Triglycerides: 64 mg/dL (ref 0–149)
VLDL Cholesterol Cal: 12 mg/dL (ref 5–40)

## 2021-10-29 LAB — CMP14+EGFR
ALT: 14 IU/L (ref 0–32)
AST: 17 IU/L (ref 0–40)
Albumin/Globulin Ratio: 1.3 (ref 1.2–2.2)
Albumin: 4.4 g/dL (ref 3.8–4.8)
Alkaline Phosphatase: 160 IU/L — ABNORMAL HIGH (ref 44–121)
BUN/Creatinine Ratio: 17 (ref 12–28)
BUN: 17 mg/dL (ref 8–27)
Bilirubin Total: 0.2 mg/dL (ref 0.0–1.2)
CO2: 27 mmol/L (ref 20–29)
Calcium: 9.5 mg/dL (ref 8.7–10.3)
Chloride: 102 mmol/L (ref 96–106)
Creatinine, Ser: 0.98 mg/dL (ref 0.57–1.00)
Globulin, Total: 3.3 g/dL (ref 1.5–4.5)
Glucose: 73 mg/dL (ref 70–99)
Potassium: 3.9 mmol/L (ref 3.5–5.2)
Sodium: 142 mmol/L (ref 134–144)
Total Protein: 7.7 g/dL (ref 6.0–8.5)
eGFR: 64 mL/min/{1.73_m2} (ref >59)

## 2021-10-29 LAB — VITAMIN D 25 HYDROXY (VIT D DEFICIENCY, FRACTURES): Vit D, 25-Hydroxy: 20.4 ng/mL — ABNORMAL LOW (ref 30.0–100.0)

## 2021-10-29 LAB — TSH: TSH: 1.57 u[IU]/mL (ref 0.450–4.500)

## 2021-10-29 LAB — HEMOGLOBIN A1C
Est. average glucose Bld gHb Est-mCnc: 126 mg/dL
Hgb A1c MFr Bld: 6 % — ABNORMAL HIGH (ref 4.8–5.6)

## 2021-10-30 ENCOUNTER — Other Ambulatory Visit: Payer: Self-pay | Admitting: Family Medicine

## 2021-10-31 ENCOUNTER — Other Ambulatory Visit: Payer: Self-pay | Admitting: Family Medicine

## 2021-10-31 MED ORDER — ERGOCALCIFEROL 1.25 MG (50000 UT) PO CAPS: 50000.0000 [IU] | ORAL_CAPSULE | ORAL | 2 refills | Status: DC

## 2021-12-02 ENCOUNTER — Ambulatory Visit
Admission: RE | Admit: 2021-12-02 | Discharge: 2021-12-02 | Disposition: A | Discharge status: Home / Self Care | Payer: BC Managed Care – PPO | Source: Ambulatory Visit | Attending: Family Medicine | Admitting: Family Medicine

## 2021-12-02 DIAGNOSIS — Z1231 Encounter for screening mammogram for malignant neoplasm of breast: Secondary | ICD-10-CM

## 2021-12-02 DIAGNOSIS — R922 Inconclusive mammogram: Secondary | ICD-10-CM | POA: Diagnosis not present

## 2022-01-26 ENCOUNTER — Ambulatory Visit: Payer: BC Managed Care – PPO | Admitting: Family Medicine

## 2022-01-26 ENCOUNTER — Encounter: Payer: Self-pay | Admitting: Family Medicine

## 2022-01-26 VITALS — BP 112/80 | HR 92 | Ht 61.0 in | Wt 224.1 lb

## 2022-01-26 DIAGNOSIS — I10 Essential (primary) hypertension: Secondary | ICD-10-CM | POA: Diagnosis not present

## 2022-01-26 DIAGNOSIS — R7303 Prediabetes: Secondary | ICD-10-CM | POA: Diagnosis not present

## 2022-01-26 DIAGNOSIS — Z13220 Encounter for screening for lipoid disorders: Secondary | ICD-10-CM

## 2022-01-26 NOTE — Patient Instructions (Addendum)
F/U mid to end August, call if you need me sooner ? ?CONGRATS on change in diet with success , keep it up please! ? ?It is important that you exercise regularly at least 30 minutes 5 times a week. If you develop chest pain, have severe difficulty breathing, or feel very tired, stop exercising immediately and seek medical attention  ? ? ? ? ?Fasting lipid, chem7 and EGFr and hBA1C 5 days before visit ( [facility], [place]) ? ?\ ? ?Weight loss goal of 1 pound per week ? ?Thanks for choosing [facility], we consider it a privelige to serve you. ? ? ? ? ? ? ?

## 2022-01-30 ENCOUNTER — Encounter: Payer: Self-pay | Admitting: Family Medicine

## 2022-01-30 ENCOUNTER — Telehealth: Payer: Self-pay | Admitting: Family Medicine

## 2022-01-30 NOTE — Assessment & Plan Note (Signed)
Controlled, no change in medication ?DASH diet and commitment to daily physical activity for a minimum of 30 minutes discussed and encouraged, as a part of hypertension management. ?The importance of attaining a healthy weight is also discussed. ? ? ?  01/26/2022  ?  9:47 AM 10/28/2021  ?  9:37 AM 10/28/2021  ?  9:11 AM 06/24/2021  ?  1:24 PM 06/24/2021  ?  1:07 PM 05/20/2021  ?  1:59 PM 05/06/2021  ? 10:19 AM  ?BP/Weight  ?Systolic BP 483 507 573 225 148 142 140  ?Diastolic BP 80 82 87 88 72 100 90  ?Wt. (Lbs) 224.12  230  225 228.4   ?BMI 42.35 kg/m2  43.46 kg/m2  42.51 kg/m2 43.16 kg/m2   ? ? ? ? ?

## 2022-01-30 NOTE — Progress Notes (Signed)
? ?Megan Morales     MRN: [id number]      DOB: 07-Mar-1956 ? ? ?HPI ?Megan Morales is here for follow up and re-evaluation of chronic medical conditions, medication management and review of any available recent lab and radiology data.  ?Preventive health is updated, specifically  Cancer screening and Immunization.   ?Questions or concerns regarding consultations or procedures which the PT has had in the interim are  addressed. ?The PT denies any adverse reactions to current medications since the last visit.  ?There are no new concerns.  ?There are no specific complaints  ? ?ROS ?Denies recent fever or chills. ?Denies sinus pressure, nasal congestion, ear pain or sore throat. ?Denies chest congestion, productive cough or wheezing. ?Denies chest pains, palpitations and leg swelling ?Denies abdominal pain, nausea, vomiting,diarrhea or constipation.   ?Denies dysuria, frequency, hesitancy or incontinence. ?Denies joint pain, swelling and limitation in mobility. ?Denies headaches, seizures, numbness, or tingling. ?Denies depression, anxiety or insomnia. ?Denies skin break down or rash. ? ? ?PE ? ?BP 112/80   Pulse 92   Ht '5\' 1"'$  (1.549 m)   Wt 224 lb 1.9 oz (101.7 kg)   SpO2 94%   BMI 42.35 kg/m?  ? ?Patient alert and oriented and in no cardiopulmonary distress. ? ?HEENT: No facial asymmetry, EOMI,     Neck supple . ? ?Chest: Clear to auscultation bilaterally. ? ?CVS: S1, S2 no murmurs, no S3.Regular rate. ? ?ABD: Soft non tender.  ? ?Ext: No edema ? ?MS: Adequate ROM spine, shoulders, hips and knees. ? ?Skin: Intact, no ulcerations or rash noted. ? ?Psych: Good eye contact, normal affect. Memory intact not anxious or depressed appearing. ? ?CNS: CN 2-12 intact, power,  normal throughout.no focal deficits noted. ? ? ?Assessment & Plan ? ?ESSENTIAL HYPERTENSION, BENIGN ?Controlled, no change in medication ?DASH diet and commitment to daily physical activity for a minimum of 30 minutes discussed and encouraged, as a part  of hypertension management. ?The importance of attaining a healthy weight is also discussed. ? ? ?  01/26/2022  ?  9:47 AM 10/28/2021  ?  9:37 AM 10/28/2021  ?  9:11 AM 06/24/2021  ?  1:24 PM 06/24/2021  ?  1:07 PM 05/20/2021  ?  1:59 PM 05/06/2021  ? 10:19 AM  ?BP/Weight  ?Systolic BP 132 440 102 725 148 142 140  ?Diastolic BP 80 82 87 88 72 100 90  ?Wt. (Lbs) 224.12  230  225 228.4   ?BMI 42.35 kg/m2  43.46 kg/m2  42.51 kg/m2 43.16 kg/m2   ? ? ? ? ? ?Morbid obesity (Megan Morales) ? ?Patient re-educated about  the importance of commitment to a  minimum of 150 minutes of exercise per week as able. ? ?The importance of healthy food choices with portion control discussed, as well as eating regularly and within a 12 hour window most days. ?The need to choose "clean , green" food 50 to 75% of the time is discussed, as well as to make water the primary drink and set a goal of 64 ounces water daily. ? ?  ? ?  01/26/2022  ?  9:47 AM 10/28/2021  ?  9:11 AM 06/24/2021  ?  1:07 PM  ?Weight /BMI  ?Weight 224 lb 1.9 oz 230 lb 225 lb  ?Height '5\' 1"'$  (1.549 m) '5\' 1"'$  (1.549 m) '5\' 1"'$  (1.549 m)  ?BMI 42.35 kg/m2 43.46 kg/m2 42.51 kg/m2  ? ? ? ? ?Prediabetes ?Patient educated about the importance of limiting  Carbohydrate intake , the need to commit to daily physical activity for a minimum of 30 minutes , and to commit weight loss. ?The fact that changes in all these areas will reduce or eliminate all together the development of diabetes is stressed.  ?Deteriorated ?Updated lab needed at/ before next visit. ? ? ? ?  Latest Ref Rng & Units 10/28/2021  ? 10:00 AM 05/06/2021  ? 10:41 AM 11/06/2020  ?  9:55 AM 11/06/2020  ?  9:15 AM 05/25/2020  ? 10:18 AM  ?Diabetic Labs  ?HbA1c 4.8 - 5.6 % 6.0    5.7      ?Chol 100 - 199 mg/dL 195     172   204    ?HDL >39 mg/dL 57     51   56    ?Calc LDL 0 - 99 mg/dL 126     111   135    ?Triglycerides 0 - 149 mg/dL 64     51   73    ?Creatinine 0.57 - 1.00 mg/dL 0.98   0.94    0.94     ? ? ?  01/26/2022  ?  9:47 AM 10/28/2021  ?   9:37 AM 10/28/2021  ?  9:11 AM 06/24/2021  ?  1:24 PM 06/24/2021  ?  1:07 PM 05/20/2021  ?  1:59 PM 05/06/2021  ? 10:19 AM  ?BP/Weight  ?Systolic BP 275 170 017 494 148 142 140  ?Diastolic BP 80 82 87 88 72 100 90  ?Wt. (Lbs) 224.12  230  225 228.4   ?BMI 42.35 kg/m2  43.46 kg/m2  42.51 kg/m2 43.16 kg/m2   ? ?   ? View : No data to display.  ?  ?  ?  ? ? ? ? ?

## 2022-01-30 NOTE — Assessment & Plan Note (Signed)
Patient educated about the importance of limiting  Carbohydrate intake , the need to commit to daily physical activity for a minimum of 30 minutes , and to commit weight loss. ?The fact that changes in all these areas will reduce or eliminate all together the development of diabetes is stressed.  ?Deteriorated ?Updated lab needed at/ before next visit. ? ? ? ?  Latest Ref Rng & Units 10/28/2021  ? 10:00 AM 05/06/2021  ? 10:41 AM 11/06/2020  ?  9:55 AM 11/06/2020  ?  9:15 AM 05/25/2020  ? 10:18 AM  ?Diabetic Labs  ?HbA1c 4.8 - 5.6 % 6.0    5.7      ?Chol 100 - 199 mg/dL 195     172   204    ?HDL >39 mg/dL 57     51   56    ?Calc LDL 0 - 99 mg/dL 126     111   135    ?Triglycerides 0 - 149 mg/dL 64     51   73    ?Creatinine 0.57 - 1.00 mg/dL 0.98   0.94    0.94     ? ? ?  01/26/2022  ?  9:47 AM 10/28/2021  ?  9:37 AM 10/28/2021  ?  9:11 AM 06/24/2021  ?  1:24 PM 06/24/2021  ?  1:07 PM 05/20/2021  ?  1:59 PM 05/06/2021  ? 10:19 AM  ?BP/Weight  ?Systolic BP 341 937 902 409 148 142 140  ?Diastolic BP 80 82 87 88 72 100 90  ?Wt. (Lbs) 224.12  230  225 228.4   ?BMI 42.35 kg/m2  43.46 kg/m2  42.51 kg/m2 43.16 kg/m2   ? ?   ? View : No data to display.  ?  ?  ?  ? ? ? ?

## 2022-01-30 NOTE — Assessment & Plan Note (Signed)
?  Patient re-educated about  the importance of commitment to a  minimum of 150 minutes of exercise per week as able. ? ?The importance of healthy food choices with portion control discussed, as well as eating regularly and within a 12 hour window most days. ?The need to choose "clean , green" food 50 to 75% of the time is discussed, as well as to make water the primary drink and set a goal of 64 ounces water daily. ? ?  ? ?  01/26/2022  ?  9:47 AM 10/28/2021  ?  9:11 AM 06/24/2021  ?  1:07 PM  ?Weight /BMI  ?Weight 224 lb 1.9 oz 230 lb 225 lb  ?Height '5\' 1"'$  (1.549 m) '5\' 1"'$  (1.549 m) '5\' 1"'$  (1.549 m)  ?BMI 42.35 kg/m2 43.46 kg/m2 42.51 kg/m2  ? ? ? ?

## 2022-01-30 NOTE — Telephone Encounter (Signed)
Pls follow through re wegovy script ,let me know if alternatives recommended if this is rejected, thanks ?

## 2022-02-01 NOTE — Telephone Encounter (Signed)
PA submitted to cover my meds

## 2022-02-01 NOTE — Telephone Encounter (Signed)
Fax sent to [facility] to send back patients rx insurance info since the PA on cover my meds said pt not found  ?

## 2022-02-03 NOTE — Telephone Encounter (Signed)
Called pt no answer °

## 2022-02-03 NOTE — Telephone Encounter (Signed)
Megan Morales denied-  ?Reason: weight loss medication is not a covered benefit. ?

## 2022-02-04 ENCOUNTER — Telehealth: Payer: Self-pay | Admitting: Family Medicine

## 2022-02-04 ENCOUNTER — Other Ambulatory Visit: Payer: Self-pay | Admitting: Family Medicine

## 2022-02-04 MED ORDER — PHENTERMINE HCL 37.5 MG PO TABS: ORAL_TABLET | ORAL | 3 refills | Status: DC

## 2022-02-04 NOTE — Telephone Encounter (Signed)
See duplicate message.  ?

## 2022-02-04 NOTE — Telephone Encounter (Signed)
She agrees to try phentermine.Please advise  ?

## 2022-02-04 NOTE — Telephone Encounter (Signed)
Patient returned call.  ? ?Did inform patient of the phentermine option, and patient said she will go with that . ? ?Can give patient a call back. ?

## 2022-02-04 NOTE — Telephone Encounter (Signed)
Phentermine prescribed and pt is aware ?

## 2022-05-25 ENCOUNTER — Ambulatory Visit: Payer: BC Managed Care – PPO | Admitting: Family Medicine

## 2022-06-14 ENCOUNTER — Ambulatory Visit: Payer: BC Managed Care – PPO | Admitting: Family Medicine

## 2022-06-22 ENCOUNTER — Other Ambulatory Visit: Payer: Self-pay

## 2022-06-22 ENCOUNTER — Ambulatory Visit: Payer: BC Managed Care – PPO | Admitting: Family Medicine

## 2022-06-22 ENCOUNTER — Encounter: Payer: Self-pay | Admitting: Family Medicine

## 2022-06-22 VITALS — BP 160/100 | HR 78 | Ht 61.0 in | Wt 221.1 lb

## 2022-06-22 DIAGNOSIS — I10 Essential (primary) hypertension: Secondary | ICD-10-CM | POA: Diagnosis not present

## 2022-06-22 DIAGNOSIS — B369 Superficial mycosis, unspecified: Secondary | ICD-10-CM

## 2022-06-22 DIAGNOSIS — J209 Acute bronchitis, unspecified: Secondary | ICD-10-CM

## 2022-06-22 DIAGNOSIS — R7303 Prediabetes: Secondary | ICD-10-CM | POA: Diagnosis not present

## 2022-06-22 DIAGNOSIS — Z13220 Encounter for screening for lipoid disorders: Secondary | ICD-10-CM | POA: Diagnosis not present

## 2022-06-22 DIAGNOSIS — J0100 Acute maxillary sinusitis, unspecified: Secondary | ICD-10-CM | POA: Diagnosis not present

## 2022-06-22 MED ORDER — FLUCONAZOLE 100 MG PO TABS: 100.0000 mg | ORAL_TABLET | Freq: Every day | ORAL | 0 refills | Status: DC

## 2022-06-22 MED ORDER — BENZONATATE 100 MG PO CAPS: 100.0000 mg | ORAL_CAPSULE | Freq: Two times a day (BID) | ORAL | 0 refills | Status: DC | PRN

## 2022-06-22 MED ORDER — CLOTRIMAZOLE-BETAMETHASONE 1-0.05 % EX CREA: 1.0000 | TOPICAL_CREAM | Freq: Two times a day (BID) | CUTANEOUS | 1 refills | Status: DC

## 2022-06-22 MED ORDER — AZITHROMYCIN 250 MG PO TABS
ORAL_TABLET | ORAL | 0 refills | Status: AC
Start: 2022-06-22 — End: 2022-06-27

## 2022-06-22 NOTE — Progress Notes (Signed)
Megan Morales     MRN: [id number]      DOB: Feb 24, 1956   HPI Megan Morales is here fhead and chest congestion, green sputum and green post nasal drainage , right posterior chest pain with cough, fatigue, symptoms started on 06/17/2022, has felt too weak and ill to work since symptoms onset.  OTC meds have been of little benefit Rash under breasts, which itches, has had in the past and most often in the warm Winter months  ROS Pertinent positives recorded Denis  cardiovascular, GU  and GI symptoms.   PE  BP (!) 160/100   Pulse 78   Ht '5\' 1"'$  (1.549 m)   Wt 221 lb 1.9 oz (100.3 kg)   SpO2 96%   BMI 41.78 kg/m   Patient alert and oriented and in no cardiopulmonary distress.  HEENT: No facial asymmetry, EOMI,     Neck supple .tender over maxillary sinus  Chest: scattered crackles and few wheezes, decreased air entry in R  no S3.Regular rate.  ABD: Soft non tender.   Ext: No edema  MS: decreased ROM spine, shoulders, hips and knees.  Skin: fungal slightly erythematous macular rash with raised edges under both breasts Psych: Good eye contact, normal affect. Memory intact not anxious or depressed appearing.  CNS: CN 2-12 intact, power,  normal throughout.no focal deficits noted.   Assessment & Plan  Acute bronchitis Z pack and tessalon perles prescribed, no CXR at this time, will order if not fully cleared in 2 weeks , symptomatically , or if her condition worsens   Acute sinusitis Z pack and work excuse to retrurn 9/26/ 2023  Morbid obesity ([place])  Patient re-educated about  the importance of commitment to a  minimum of 150 minutes of exercise per week as able.  The importance of healthy food choices with portion control discussed, as well as eating regularly and within a 12 hour window most days. The need to choose "clean , green" food 50 to 75% of the time is discussed, as well as to make water the primary drink and set a goal of 64 ounces water daily.        06/22/2022   11:33 AM 01/26/2022    9:47 AM 10/28/2021    9:11 AM  Weight /BMI  Weight 221 lb 1.9 oz 224 lb 1.9 oz 230 lb  Height '5\' 1"'$  (1.549 m) '5\' 1"'$  (1.549 m) '5\' 1"'$  (1.549 m)  BMI 41.78 kg/m2 42.35 kg/m2 43.46 kg/m2      ESSENTIAL HYPERTENSION, BENIGN Uncontrolled, reports " missing" morning med on several ties/ week Compkiance importance is sressd, re eval in 6 weeks DASH diet and commitment to daily physical activity for a minimum of 30 minutes discussed and encouraged, as a part of hypertension management. The importance of attaining a healthy weight is also discussed.     06/22/2022   11:55 AM 06/22/2022   11:36 AM 06/22/2022   11:33 AM 01/26/2022    9:47 AM 10/28/2021    9:37 AM 10/28/2021    9:11 AM 06/24/2021    1:24 PM  BP/Weight  Systolic BP 637 858 850 277 412 878 676  Diastolic BP 720 92 947 80 82 87 88  Wt. (Lbs)   221.12 224.12  230   BMI   41.78 kg/m2 42.35 kg/m2  43.46 kg/m2      '  Dermatomycosis Present under both breasts, right greater than left, oral fluconazole and topical clotrimazole/ betameth,cornstarch when rash fully treated to  help maintain low to no moisture

## 2022-06-22 NOTE — Patient Instructions (Addendum)
Annual exam and re evaluate blood pressure in 4 to 6 weeks  Work excuse from 09/15 to 06/27/2022  NEED to take all medication every day as prescribed, blood pressure is high, nurse pls send in  90 days supply with 1 refill of amlodipine,  benazepril/HCTZ and vit D weekly  Medication is prescribed for acute bronchitis, Z pack and tessalon perle  Medication I s prescribed for rash, fluconazole and clotrimazole/ betameth cream  If unable to get labs today fasting please obtain one day in the next 1 week  Thanks for choosing  [facility], we consider it a privelige to serve you.   ,

## 2022-06-23 LAB — BMP8+EGFR
BUN/Creatinine Ratio: 20 (ref 12–28)
BUN: 19 mg/dL (ref 8–27)
CO2: 26 mmol/L (ref 20–29)
Calcium: 8.9 mg/dL (ref 8.7–10.3)
Chloride: 99 mmol/L (ref 96–106)
Creatinine, Ser: 0.96 mg/dL (ref 0.57–1.00)
Glucose: 83 mg/dL (ref 70–99)
Potassium: 3.8 mmol/L (ref 3.5–5.2)
Sodium: 140 mmol/L (ref 134–144)
eGFR: 66 mL/min/{1.73_m2} (ref >59)

## 2022-06-23 LAB — LIPID PANEL
Chol/HDL Ratio: 3.7 ratio (ref 0.0–4.4)
Cholesterol, Total: 181 mg/dL (ref 100–199)
HDL: 49 mg/dL (ref >39)
LDL Chol Calc (NIH): 117 mg/dL — ABNORMAL HIGH (ref 0–99)
Triglycerides: 78 mg/dL (ref 0–149)
VLDL Cholesterol Cal: 15 mg/dL (ref 5–40)

## 2022-06-23 LAB — HEMOGLOBIN A1C
Est. average glucose Bld gHb Est-mCnc: 123 mg/dL
Hgb A1c MFr Bld: 5.9 % — ABNORMAL HIGH (ref 4.8–5.6)

## 2022-06-27 ENCOUNTER — Encounter: Payer: Self-pay | Admitting: Family Medicine

## 2022-06-27 DIAGNOSIS — B369 Superficial mycosis, unspecified: Secondary | ICD-10-CM | POA: Insufficient documentation

## 2022-06-27 DIAGNOSIS — J019 Acute sinusitis, unspecified: Secondary | ICD-10-CM | POA: Insufficient documentation

## 2022-06-27 DIAGNOSIS — J209 Acute bronchitis, unspecified: Secondary | ICD-10-CM | POA: Insufficient documentation

## 2022-06-27 NOTE — Assessment & Plan Note (Signed)
Z pack and work excuse to retrurn 9/26/ 2023

## 2022-06-27 NOTE — Assessment & Plan Note (Signed)
  Patient re-educated about  the importance of commitment to a  minimum of 150 minutes of exercise per week as able.  The importance of healthy food choices with portion control discussed, as well as eating regularly and within a 12 hour window most days. The need to choose "clean , green" food 50 to 75% of the time is discussed, as well as to make water the primary drink and set a goal of 64 ounces water daily.       06/22/2022   11:33 AM 01/26/2022    9:47 AM 10/28/2021    9:11 AM  Weight /BMI  Weight 221 lb 1.9 oz 224 lb 1.9 oz 230 lb  Height '5\' 1"'$  (1.549 m) '5\' 1"'$  (1.549 m) '5\' 1"'$  (1.549 m)  BMI 41.78 kg/m2 42.35 kg/m2 43.46 kg/m2

## 2022-06-27 NOTE — Assessment & Plan Note (Signed)
Uncontrolled, reports " missing" morning med on several ties/ week Compkiance importance is sressd, re eval in 6 weeks DASH diet and commitment to daily physical activity for a minimum of 30 minutes discussed and encouraged, as a part of hypertension management. The importance of attaining a healthy weight is also discussed.     06/22/2022   11:55 AM 06/22/2022   11:36 AM 06/22/2022   11:33 AM 01/26/2022    9:47 AM 10/28/2021    9:37 AM 10/28/2021    9:11 AM 06/24/2021    1:24 PM  BP/Weight  Systolic BP 023 343 568 616 837 290 211  Diastolic BP 155 92 208 80 82 87 88  Wt. (Lbs)   221.12 224.12  230   BMI   41.78 kg/m2 42.35 kg/m2  43.46 kg/m2      '

## 2022-06-27 NOTE — Assessment & Plan Note (Signed)
Z pack and tessalon perles prescribed, no CXR at this time, will order if not fully cleared in 2 weeks , symptomatically , or if her condition worsens

## 2022-06-27 NOTE — Assessment & Plan Note (Signed)
Present under both breasts, right greater than left, oral fluconazole and topical clotrimazole/ betameth,cornstarch when rash fully treated to help maintain low to no moisture

## 2022-08-11 ENCOUNTER — Encounter: Payer: BC Managed Care – PPO | Admitting: Family Medicine

## 2022-09-14 ENCOUNTER — Encounter: Payer: Self-pay | Admitting: Family Medicine

## 2022-09-14 ENCOUNTER — Ambulatory Visit (INDEPENDENT_AMBULATORY_CARE_PROVIDER_SITE_OTHER): Payer: BC Managed Care – PPO | Admitting: Family Medicine

## 2022-09-14 VITALS — BP 148/96 | HR 81 | Ht 61.0 in | Wt 229.1 lb

## 2022-09-14 DIAGNOSIS — Z0001 Encounter for general adult medical examination with abnormal findings: Secondary | ICD-10-CM

## 2022-09-14 DIAGNOSIS — Z780 Asymptomatic menopausal state: Secondary | ICD-10-CM

## 2022-09-14 MED ORDER — CARVEDILOL 3.125 MG PO TABS: 3.1250 mg | ORAL_TABLET | Freq: Two times a day (BID) | ORAL | 3 refills | Status: DC

## 2022-09-14 MED ORDER — SEMAGLUTIDE-WEIGHT MANAGEMENT 0.25 MG/0.5ML ~~LOC~~ SOAJ: 0.2500 mg | SUBCUTANEOUS | 0 refills | Status: AC

## 2022-09-14 NOTE — Patient Instructions (Addendum)
F/U in 2 months, call if  you need me sooner  New ADDITIONAL medicine for blood pressure which is still too high, carvedilol one tab two times daily, please take on a schedule for example at 7 AM and 7 PM.  Megan Morales has been prescribed and sent to your local pharmacy once more.  Please send me a message to let me know if there is a problem with your insurance covering it.  Remember to eat like we discussed primarily vegetables and fruits  with some protein, drink 64 ounces of water daily, stop eating after your last meal by 8:00 at night.  It is important that you exercise regularly at least 30 minutes 5 times a week. If you develop chest pain, have severe difficulty breathing, or feel very tired, stop exercising immediately and seek medical attention   Fasting CBC, lipid, cmp and eGFr, hBA1C , tSH and vit D 3 to 7 days before next appointment  Mammogram and DEXA are to be scheduled at checkout.(Both in [place])  Thanks for choosing [facility], we consider it a privelige to serve you. Best to you and family for 2024!

## 2022-09-14 NOTE — Progress Notes (Signed)
    Megan Morales     MRN: [id number]      DOB: July 22, 1956  HPI: Patient is in for annual physical exam. Uncontrolled BP is also addressed  PE: BP (!) 148/96   Pulse 81   Ht '5\' 1"'$  (1.549 m)   Wt 229 lb 1.9 oz (103.9 kg)   SpO2 97%   BMI 43.29 kg/m   Pleasant  female, alert and oriented x 3, in no cardio-pulmonary distress. Afebrile. HEENT No facial trauma or asymetry. Sinuses non tender.  Extra occullar muscles intact.. External ears normal, . Neck: supple, no adenopathy,JVD or thyromegaly.No bruits.  Chest: Clear to ascultation bilaterally.No crackles or wheezes. Non tender to palpation  Cardiovascular system; Heart sounds normal,  S1 and  S2 ,no S3.  No murmur, or thrill. Apical beat not displaced Peripheral pulses normal.  Abdomen: Soft, non tenderound.   .   Musculoskeletal exam: Full ROM of spine, hips , shoulders and knees. No deformity ,swelling or crepitus noted. No muscle wasting or atrophy.   Neurologic: Cranial nerves 2 to 12 intact. Power, tone ,sensation and reflexes normal throughout. No disturbance in gait. No tremor.  Skin: Intact, no ulceration, erythema , scaling or rash noted. Pigmentation normal throughout  Psych; Normal mood and affect. Judgement and concentration normal   Assessment & Plan:  Annual visit for general adult medical examination with abnormal findings Annual exam as documented. . Immunization and cancer screening needs are specifically addressed at this visit.

## 2022-09-17 ENCOUNTER — Encounter: Payer: Self-pay | Admitting: Family Medicine

## 2022-09-17 DIAGNOSIS — Z0001 Encounter for general adult medical examination with abnormal findings: Secondary | ICD-10-CM | POA: Insufficient documentation

## 2022-09-17 NOTE — Assessment & Plan Note (Signed)
Annual exam as documented. . Immunization and cancer screening needs are specifically addressed at this visit.  

## 2022-10-21 ENCOUNTER — Ambulatory Visit: Payer: BC Managed Care – PPO | Admitting: Podiatry

## 2022-10-21 DIAGNOSIS — M722 Plantar fascial fibromatosis: Secondary | ICD-10-CM

## 2022-10-21 MED ORDER — TRIAMCINOLONE ACETONIDE 10 MG/ML IJ SUSP
10.0000 mg | Freq: Once | INTRAMUSCULAR | Status: AC
  Administered 2022-10-21: 10 mg

## 2022-10-21 NOTE — Progress Notes (Signed)
Subjective: Chief Complaint  Patient presents with   Plantar Fasciitis    LEFT FOOT, HEEL PAIN, TREATMENT INCLUDES ELEVATION OF FOOT    67 year old female presents the office today with above concerns.  The pain went away but came back about 3 weeks ago. It is worse the more she is on her feet. At night once she gets home and relaxed and stand back up it hurts. No treatment. No injuries.  Some numbness to the heel but no radiation.  No burning.  Objective: AAO x3, NAD DP/PT pulses palpable bilaterally, CRT less than 3 seconds Tenderness to palpation along the plantar medial tubercle of the calcaneus at the insertion of plantar fascia on the left foot. There is no pain along the course of the plantar fascia within the arch of the foot. Plantar fascia appears to be intact. There is no pain with lateral compression of the calcaneus or pain with vibratory sensation. There is no pain along the course or insertion of the achilles tendon. No other areas of tenderness to bilateral lower extremities.  Negative Tinel sign. No pain with calf compression, swelling, warmth, erythema  Assessment: Left heel pain, reoccurrence plantar fasciitis  Plan: -All treatment options discussed with the patient including all alternatives, risks, complications.  -Steroid injection performed.  See procedure note below. -Night splint dispensed -Discussed traction, icing on regular basis. -Discussed using good arch supports. -Patient encouraged to call the office with any questions, concerns, change in symptoms.   Procedure: Injection Tendon/Ligament Discussed alternatives, risks, complications and verbal consent was obtained.  Location: Left plantar fascia at the glabrous junction; medial approach. Skin Prep: Alcohol. Injectate: 0.5cc 0.5% marcaine plain, 0.5 cc 2% lidocaine plain and, 1 cc kenalog 10. Disposition: Patient tolerated procedure well. Injection site dressed with a band-aid.  Post-injection care was  discussed and return precautions discussed.   Return if symptoms worsen or fail to improve.  Trula Slade DPM

## 2022-10-21 NOTE — Patient Instructions (Signed)
For instructions on how to put on your Night Splint, please visit [email or web address]  ---   While at your visit today you received a steroid injection in your foot or ankle to help with your pain. Along with having the steroid medication there is some "numbing" medication in the shot that you received. Due to this you may notice some numbness to the area for the next couple of hours.   I would recommend limiting activity for the next few days to help the steroid injection take affect.    The actually benefit from the steroid injection may take up to 2-7 days to see a difference. You may actually experience a small (as in 10%) INCREASE in pain in the first 24 hours---that is common. It would be best if you can ice the area today and take anti-inflammatory medications (such as Ibuprofen, Motrin, or Aleve) if you are able to take these medications. If you were prescribed another medication to help with the pain go ahead and start that medication today    Things to watch out for that you should contact us or a health care provider urgently would include: 1. Unusual (as in more than 10%) increase in pain 2. New fever > 101.5 3. New swelling or redness of the injected area.  4. Streaking of red lines around the area injected.  If you have any questions or concerns about this, please give our office a call at [phone].   ---  Plantar Fasciitis (Heel Spur Syndrome) with Rehab The plantar fascia is a fibrous, ligament-like, soft-tissue structure that spans the bottom of the foot. Plantar fasciitis is a condition that causes pain in the foot due to inflammation of the tissue. SYMPTOMS  Pain and tenderness on the underneath side of the foot. Pain that worsens with standing or walking. CAUSES  Plantar fasciitis is caused by irritation and injury to the plantar fascia on the underneath side of the foot. Common mechanisms of injury include: Direct trauma to bottom of the foot. Damage to a  small nerve that runs under the foot where the main fascia attaches to the heel bone. Stress placed on the plantar fascia due to bone spurs. RISK INCREASES WITH:  Activities that place stress on the plantar fascia (running, jumping, pivoting, or cutting). Poor strength and flexibility. Improperly fitted shoes. Tight calf muscles. Flat feet. Failure to warm-up properly before activity. Obesity. PREVENTION Warm up and stretch properly before activity. Allow for adequate recovery between workouts. Maintain physical fitness: Strength, flexibility, and endurance. Cardiovascular fitness. Maintain a health body weight. Avoid stress on the plantar fascia. Wear properly fitted shoes, including arch supports for individuals who have flat feet.  PROGNOSIS  If treated properly, then the symptoms of plantar fasciitis usually resolve without surgery. However, occasionally surgery is necessary.  RELATED COMPLICATIONS  Recurrent symptoms that may result in a chronic condition. Problems of the lower back that are caused by compensating for the injury, such as limping. Pain or weakness of the foot during push-off following surgery. Chronic inflammation, scarring, and partial or complete fascia tear, occurring more often from repeated injections.  TREATMENT  Treatment initially involves the use of ice and medication to help reduce pain and inflammation. The use of strengthening and stretching exercises may help reduce pain with activity, especially stretches of the Achilles tendon. These exercises may be performed at home or with a therapist. Your caregiver may recommend that you use heel cups of arch supports to help reduce stress on the plantar  the plantar fascia. Occasionally, corticosteroid injections are given to reduce inflammation. If symptoms persist for greater than 6 months despite non-surgical (conservative), then surgery may be recommended.   MEDICATION  If pain medication is  necessary, then nonsteroidal anti-inflammatory medications, such as aspirin and ibuprofen, or other minor pain relievers, such as acetaminophen, are often recommended. Do not take pain medication within 7 days before surgery. Prescription pain relievers may be given if deemed necessary by your caregiver. Use only as directed and only as much as you need. Corticosteroid injections may be given by your caregiver. These injections should be reserved for the most serious cases, because they may only be given a certain number of times.  HEAT AND COLD Cold treatment (icing) relieves pain and reduces inflammation. Cold treatment should be applied for 10 to 15 minutes every 2 to 3 hours for inflammation and pain and immediately after any activity that aggravates your symptoms. Use ice packs or massage the area with a piece of ice (ice massage). Heat treatment may be used prior to performing the stretching and strengthening activities prescribed by your caregiver, physical therapist, or athletic trainer. Use a heat pack or soak the injury in warm water.  SEEK IMMEDIATE MEDICAL CARE IF: Treatment seems to offer no benefit, or the condition worsens. Any medications produce adverse side effects.  EXERCISES- RANGE OF MOTION (ROM) AND STRETCHING EXERCISES - Plantar Fasciitis (Heel Spur Syndrome) These exercises may help you when beginning to rehabilitate your injury. Your symptoms may resolve with or without further involvement from your physician, physical therapist or athletic trainer. While completing these exercises, remember:  Restoring tissue flexibility helps normal motion to return to the joints. This allows healthier, less painful movement and activity. An effective stretch should be held for at least 30 seconds. A stretch should never be painful. You should only feel a gentle lengthening or release in the stretched tissue.  RANGE OF MOTION - Toe Extension, Flexion Sit with your right / left leg  crossed over your opposite knee. Grasp your toes and gently pull them back toward the top of your foot. You should feel a stretch on the bottom of your toes and/or foot. Hold this stretch for 10 seconds. Now, gently pull your toes toward the bottom of your foot. You should feel a stretch on the top of your toes and or foot. Hold this stretch for 10 seconds. Repeat  times. Complete this stretch 3 times per day.   RANGE OF MOTION - Ankle Dorsiflexion, Active Assisted Remove shoes and sit on a chair that is preferably not on a carpeted surface. Place right / left foot under knee. Extend your opposite leg for support. Keeping your heel down, slide your right / left foot back toward the chair until you feel a stretch at your ankle or calf. If you do not feel a stretch, slide your bottom forward to the edge of the chair, while still keeping your heel down. Hold this stretch for 10 seconds. Repeat 3 times. Complete this stretch 2 times per day.   STRETCH  Gastroc, Standing Place hands on wall. Extend right / left leg, keeping the front knee somewhat bent. Slightly point your toes inward on your back foot. Keeping your right / left heel on the floor and your knee straight, shift your weight toward the wall, not allowing your back to arch. You should feel a gentle stretch in the right / left calf. Hold this position for 10 seconds. Repeat 3 times. Complete this   stretch 2 times per day.  STRETCH  Soleus, Standing Place hands on wall. Extend right / left leg, keeping the other knee somewhat bent. Slightly point your toes inward on your back foot. Keep your right / left heel on the floor, bend your back knee, and slightly shift your weight over the back leg so that you feel a gentle stretch deep in your back calf. Hold this position for 10 seconds. Repeat 3 times. Complete this stretch 2 times per day.  STRETCH  Gastrocsoleus, Standing  Note: This exercise can place a lot of stress on your foot  and ankle. Please complete this exercise only if specifically instructed by your caregiver.  Place the ball of your right / left foot on a step, keeping your other foot firmly on the same step. Hold on to the wall or a rail for balance. Slowly lift your other foot, allowing your body weight to press your heel down over the edge of the step. You should feel a stretch in your right / left calf. Hold this position for 10 seconds. Repeat this exercise with a slight bend in your right / left knee. Repeat 3 times. Complete this stretch 2 times per day.   STRENGTHENING EXERCISES - Plantar Fasciitis (Heel Spur Syndrome)  These exercises may help you when beginning to rehabilitate your injury. They may resolve your symptoms with or without further involvement from your physician, physical therapist or athletic trainer. While completing these exercises, remember:  Muscles can gain both the endurance and the strength needed for everyday activities through controlled exercises. Complete these exercises as instructed by your physician, physical therapist or athletic trainer. Progress the resistance and repetitions only as guided.  STRENGTH - Towel Curls Sit in a chair positioned on a non-carpeted surface. Place your foot on a towel, keeping your heel on the floor. Pull the towel toward your heel by only curling your toes. Keep your heel on the floor. Repeat 3 times. Complete this exercise 2 times per day.  STRENGTH - Ankle Inversion Secure one end of a rubber exercise band/tubing to a fixed object (table, pole). Loop the other end around your foot just before your toes. Place your fists between your knees. This will focus your strengthening at your ankle. Slowly, pull your big toe up and in, making sure the band/tubing is positioned to resist the entire motion. Hold this position for 10 seconds. Have your muscles resist the band/tubing as it slowly pulls your foot back to the starting position. Repeat 3  times. Complete this exercises 2 times per day.  Document Released: 09/19/2005 Document Revised: 12/12/2011 Document Reviewed: 01/01/2009 [employer] Patient Information 2014 [employer].  

## 2022-11-15 ENCOUNTER — Ambulatory Visit: Payer: BC Managed Care – PPO | Admitting: Family Medicine

## 2022-11-16 ENCOUNTER — Ambulatory Visit: Payer: BC Managed Care – PPO | Admitting: Family Medicine

## 2022-11-21 ENCOUNTER — Other Ambulatory Visit: Payer: Self-pay | Admitting: Family Medicine

## 2022-11-21 DIAGNOSIS — I10 Essential (primary) hypertension: Secondary | ICD-10-CM

## 2022-11-21 DIAGNOSIS — E876 Hypokalemia: Secondary | ICD-10-CM

## 2022-11-24 ENCOUNTER — Ambulatory Visit: Payer: BC Managed Care – PPO | Admitting: Family Medicine

## 2022-12-01 ENCOUNTER — Encounter: Payer: Self-pay | Admitting: Radiology

## 2022-12-21 ENCOUNTER — Encounter: Payer: Self-pay | Admitting: Family Medicine

## 2022-12-21 ENCOUNTER — Telehealth: Payer: BC Managed Care – PPO | Admitting: Family Medicine

## 2022-12-21 VITALS — Temp 97.7°F | Ht 61.0 in | Wt 229.0 lb

## 2022-12-21 DIAGNOSIS — J069 Acute upper respiratory infection, unspecified: Secondary | ICD-10-CM | POA: Diagnosis not present

## 2022-12-21 MED ORDER — ALBUTEROL SULFATE HFA 108 (90 BASE) MCG/ACT IN AERS: 2.0000 | INHALATION_SPRAY | Freq: Four times a day (QID) | RESPIRATORY_TRACT | 2 refills | Status: DC | PRN

## 2022-12-21 MED ORDER — AMOXICILLIN-POT CLAVULANATE 875-125 MG PO TABS: 1.0000 | ORAL_TABLET | Freq: Two times a day (BID) | ORAL | 0 refills | Status: DC

## 2022-12-21 MED ORDER — BENZONATATE 100 MG PO CAPS: 100.0000 mg | ORAL_CAPSULE | Freq: Two times a day (BID) | ORAL | 0 refills | Status: DC | PRN

## 2022-12-21 MED ORDER — PREDNISONE 10 MG PO TABS: 10.0000 mg | ORAL_TABLET | Freq: Two times a day (BID) | ORAL | 0 refills | Status: AC

## 2022-12-21 MED ORDER — AZELASTINE-FLUTICASONE 137-50 MCG/ACT NA SUSP: 1.0000 | Freq: Two times a day (BID) | NASAL | 1 refills | Status: DC

## 2022-12-21 NOTE — Progress Notes (Signed)
Virtual Visit via Video Note  I connected with Megan Morales on 12/21/22 at  3:00 PM EDT by a video enabled telemedicine application and verified that I am speaking with the correct person using two identifiers.  Patient Location: Home Provider Location: Office/Clinic  I discussed the limitations, risks, security, and privacy concerns of performing an evaluation and management service by video and the availability of in person appointments. I also discussed with the patient that there may be a patient responsible charge related to this service. The patient expressed understanding and agreed to proceed.  Subjective: PCP: Fayrene Helper, MD  Chief Complaint  Patient presents with   Cough    Patient complains of congestion, productive cough, headache, sore throat, chills for 9 days.    HPI  Patient complains of cough. Patient describes symptoms of shortness of breath at rest, nasal congestion, chills without rigors, cough, headache, malaise, sore throat, and green sputum production. Symptoms began a 2 weeks ago and are gradually worsening since that time. Patient denies chest pain or nausea and vomiting. Treatment thus far includes OTC analgesics/antipyretics: somewhat effective.   ROS: Per HPI  Current Outpatient Medications:    amLODipine (NORVASC) 10 MG tablet, TAKE 1 TABLET(10 MG) BY MOUTH DAILY, Disp: 90 tablet, Rfl: 2   benazepril-hydrochlorthiazide (LOTENSIN HCT) 20-12.5 MG tablet, TAKE 2 TABLETS BY MOUTH EVERY DAY FOR BLOOD PRESSURE, Disp: 180 tablet, Rfl: 2   Calcium Carbonate-Vitamin D 600-400 MG-UNIT tablet, Take 1 tablet by mouth daily., Disp: , Rfl:    carvedilol (COREG) 3.125 MG tablet, Take 1 tablet (3.125 mg total) by mouth 2 (two) times daily with a meal., Disp: 60 tablet, Rfl: 3   clotrimazole-betamethasone (LOTRISONE) cream, Apply 1 Application topically 2 (two) times daily., Disp: 45 g, Rfl: 1   ergocalciferol (VITAMIN D2) 1.25 MG (50000 UT) capsule, Take 1  capsule (50,000 Units total) by mouth once a week. One capsule once weekly, Disp: 12 capsule, Rfl: 2   Multiple Vitamin (MULTIVITAMIN) capsule, Take 1 capsule by mouth daily., Disp: , Rfl:    potassium chloride (KLOR-CON) 10 MEQ tablet, TAKE 1 TABLET(10 MEQ) BY MOUTH DAILY, Disp: 90 tablet, Rfl: 2  Observations/Objective: Today's Vitals   12/21/22 1449  Temp: 97.7 F (36.5 C)  Weight: 229 lb (103.9 kg)  Height: 5\' 1"  (1.549 m)   Physical Exam HENT:     Nose: Congestion and rhinorrhea present.  Neurological:     Mental Status: She is alert.     Assessment and Plan: Upper respiratory tract infection, unspecified type Assessment & Plan: Prescribed Augmentin 875-125 mg x 7 days, albuterol inhaler, 5 day short course of prednisone 10 mg, azelastine-fluticasone 137-50 mcg nasal spray, benzonatate 100 mg. Discussed symptomatic treatment, rest, increase oral fluid intake. Take OTC tylenol for fever medications. Follow-up for worsening or persistent symptoms. Patient verbalizes understanding regarding plan of care and all questions answered.      Follow Up Instructions: No follow-ups on file.   I discussed the assessment and treatment plan with the patient. The patient was provided an opportunity to ask questions, and all were answered. The patient agreed with the plan and demonstrated an understanding of the instructions.   The patient was advised to call back or seek an in-person evaluation if the symptoms worsen or if the condition fails to improve as anticipated.  The above assessment and management plan was discussed with the patient. The patient verbalized understanding of and has agreed to the management plan.  Renard Hamper Ria Comment, FNP

## 2022-12-21 NOTE — Assessment & Plan Note (Addendum)
Prescribed Augmentin 875-125 mg x 7 days, albuterol inhaler, 5 day short course of prednisone 10 mg, azelastine-fluticasone 137-50 mcg nasal spray, benzonatate 100 mg. Discussed symptomatic treatment, rest, increase oral fluid intake. Take OTC tylenol for fever medications. Follow-up for worsening or persistent symptoms. Patient verbalizes understanding regarding plan of care and all questions answered.

## 2023-01-24 ENCOUNTER — Other Ambulatory Visit: Payer: Self-pay | Admitting: Family Medicine

## 2023-01-24 DIAGNOSIS — Z1231 Encounter for screening mammogram for malignant neoplasm of breast: Secondary | ICD-10-CM

## 2023-02-02 ENCOUNTER — Ambulatory Visit
Admission: RE | Admit: 2023-02-02 | Discharge: 2023-02-02 | Disposition: A | Discharge status: Home / Self Care | Payer: BC Managed Care – PPO | Source: Ambulatory Visit | Attending: Family Medicine | Admitting: Family Medicine

## 2023-02-02 DIAGNOSIS — Z1231 Encounter for screening mammogram for malignant neoplasm of breast: Secondary | ICD-10-CM

## 2023-03-20 ENCOUNTER — Telehealth: Payer: Self-pay | Admitting: Family Medicine

## 2023-03-20 ENCOUNTER — Ambulatory Visit
Admission: RE | Admit: 2023-03-20 | Discharge: 2023-03-20 | Disposition: A | Discharge status: Home / Self Care | Payer: BC Managed Care – PPO | Source: Ambulatory Visit | Attending: Family Medicine | Admitting: Family Medicine

## 2023-03-20 DIAGNOSIS — E349 Endocrine disorder, unspecified: Secondary | ICD-10-CM | POA: Diagnosis not present

## 2023-03-20 DIAGNOSIS — N951 Menopausal and female climacteric states: Secondary | ICD-10-CM | POA: Diagnosis not present

## 2023-03-20 DIAGNOSIS — E559 Vitamin D deficiency, unspecified: Secondary | ICD-10-CM | POA: Diagnosis not present

## 2023-03-20 DIAGNOSIS — Z780 Asymptomatic menopausal state: Secondary | ICD-10-CM

## 2023-03-20 DIAGNOSIS — M150 Primary generalized (osteo)arthritis: Secondary | ICD-10-CM | POA: Diagnosis not present

## 2023-03-20 NOTE — Telephone Encounter (Signed)
Ps contact pt, has not been here since September, I tried calling could not get through,  Pls let her know that I am concerned.blood pressure uncontroled on file, if still pt her ( seen several cancelled appt) needs appt for re eval of chronic conditions, needs to bring meds and will need fasting lipid, cmp and EGFr and hA1C  and vit D done 3 to 5 days before her appt, pls order. If not pt here, all refills need to be discontinued I  just got a report of her bone density test and her bones are thinning, that LAO NEEDS to be addressed . Pls let me know h the response

## 2023-03-21 ENCOUNTER — Other Ambulatory Visit: Payer: Self-pay

## 2023-03-21 DIAGNOSIS — Z13220 Encounter for screening for lipoid disorders: Secondary | ICD-10-CM

## 2023-03-21 DIAGNOSIS — E559 Vitamin D deficiency, unspecified: Secondary | ICD-10-CM

## 2023-03-21 DIAGNOSIS — R7303 Prediabetes: Secondary | ICD-10-CM

## 2023-03-21 DIAGNOSIS — I10 Essential (primary) hypertension: Secondary | ICD-10-CM

## 2023-03-21 NOTE — Telephone Encounter (Signed)
Patient aware of below and appt scheduled for July patient aware of labs to be done before this visit

## 2023-04-18 ENCOUNTER — Other Ambulatory Visit: Payer: Self-pay | Admitting: Family Medicine

## 2023-04-18 DIAGNOSIS — I10 Essential (primary) hypertension: Secondary | ICD-10-CM

## 2023-04-25 DIAGNOSIS — R7303 Prediabetes: Secondary | ICD-10-CM | POA: Diagnosis not present

## 2023-04-25 DIAGNOSIS — I10 Essential (primary) hypertension: Secondary | ICD-10-CM | POA: Diagnosis not present

## 2023-04-25 DIAGNOSIS — Z13220 Encounter for screening for lipoid disorders: Secondary | ICD-10-CM | POA: Diagnosis not present

## 2023-04-25 DIAGNOSIS — E559 Vitamin D deficiency, unspecified: Secondary | ICD-10-CM | POA: Diagnosis not present

## 2023-04-26 LAB — CMP14+EGFR
ALT: 14 IU/L (ref 0–32)
AST: 18 IU/L (ref 0–40)
Albumin: 4.1 g/dL (ref 3.9–4.9)
Alkaline Phosphatase: 139 IU/L — ABNORMAL HIGH (ref 44–121)
BUN/Creatinine Ratio: 17 (ref 12–28)
BUN: 16 mg/dL (ref 8–27)
Bilirubin Total: 0.2 mg/dL (ref 0.0–1.2)
CO2: 25 mmol/L (ref 20–29)
Calcium: 9.2 mg/dL (ref 8.7–10.3)
Chloride: 103 mmol/L (ref 96–106)
Creatinine, Ser: 0.93 mg/dL (ref 0.57–1.00)
Globulin, Total: 3.5 g/dL (ref 1.5–4.5)
Glucose: 85 mg/dL (ref 70–99)
Potassium: 4.1 mmol/L (ref 3.5–5.2)
Sodium: 143 mmol/L (ref 134–144)
Total Protein: 7.6 g/dL (ref 6.0–8.5)
eGFR: 68 mL/min/{1.73_m2} (ref >59)

## 2023-04-26 LAB — HEMOGLOBIN A1C
Est. average glucose Bld gHb Est-mCnc: 126 mg/dL
Hgb A1c MFr Bld: 6 % — ABNORMAL HIGH (ref 4.8–5.6)

## 2023-04-26 LAB — LIPID PANEL
Chol/HDL Ratio: 3.5 ratio (ref 0.0–4.4)
Cholesterol, Total: 192 mg/dL (ref 100–199)
HDL: 55 mg/dL (ref >39)
LDL Chol Calc (NIH): 123 mg/dL — ABNORMAL HIGH (ref 0–99)
Triglycerides: 77 mg/dL (ref 0–149)
VLDL Cholesterol Cal: 14 mg/dL (ref 5–40)

## 2023-04-26 LAB — VITAMIN D 25 HYDROXY (VIT D DEFICIENCY, FRACTURES): Vit D, 25-Hydroxy: 17.8 ng/mL — ABNORMAL LOW (ref 30.0–100.0)

## 2023-04-27 ENCOUNTER — Encounter: Payer: Self-pay | Admitting: Family Medicine

## 2023-04-27 ENCOUNTER — Ambulatory Visit: Payer: BC Managed Care – PPO | Admitting: Family Medicine

## 2023-04-27 VITALS — BP 120/80 | HR 84 | Ht 61.0 in | Wt 231.0 lb

## 2023-04-27 DIAGNOSIS — M8580 Other specified disorders of bone density and structure, unspecified site: Secondary | ICD-10-CM

## 2023-04-27 DIAGNOSIS — I10 Essential (primary) hypertension: Secondary | ICD-10-CM | POA: Diagnosis not present

## 2023-04-27 DIAGNOSIS — E785 Hyperlipidemia, unspecified: Secondary | ICD-10-CM

## 2023-04-27 DIAGNOSIS — R7303 Prediabetes: Secondary | ICD-10-CM

## 2023-04-27 DIAGNOSIS — E8881 Metabolic syndrome: Secondary | ICD-10-CM

## 2023-04-27 MED ORDER — TIRZEPATIDE-WEIGHT MANAGEMENT 2.5 MG/0.5ML ~~LOC~~ SOAJ: 2.5000 mg | SUBCUTANEOUS | 0 refills | Status: DC

## 2023-04-27 MED ORDER — METFORMIN HCL 500 MG PO TABS
500.0000 mg | ORAL_TABLET | Freq: Two times a day (BID) | ORAL | 4 refills | Status: DC
Start: 2023-04-27 — End: 2024-04-16

## 2023-04-27 MED ORDER — ERGOCALCIFEROL 1.25 MG (50000 UT) PO CAPS: 50000.0000 [IU] | ORAL_CAPSULE | ORAL | 2 refills | Status: DC

## 2023-04-27 NOTE — Patient Instructions (Addendum)
Follow-up in 11 weeks, call if you need me sooner.  Pneumonia 20 vaccine today.States she got it through her job and will let us know  New to help with blood sugar and weight loss is metformin 500 mg 1 twice daily.  New to help with weight loss and blood sugar is sent about 1 injection with once weekly.  Please send t message to let me know if you are able to get the injection.  If not I will put you on phentermine.  You are referred for diabetic education to help with the managing food choices and portion control so that you do lose the goal of 4 pounds per month.  Nurse please also provide info on once monthly group classes  Excellent blood pressure, continue current medication.  Reduce fried and fatty foods so that your bad cholesterol gets below 100   Your bones are thinning.  It is vital that you take calcium 1000 to 1200 mg daily, this is available over-the-counter.  You also need to commit to once weekly vitamin D to help to strengthen your muscles and bones.This is prescribed  An exercise commitment of walking for 30 minutes 5 days/week will also strengthen your bones and improve your overall health.  Thanks for choosing [facility] we consider it a privelige to serve you.

## 2023-04-28 ENCOUNTER — Encounter: Payer: Self-pay | Admitting: Family Medicine

## 2023-04-28 DIAGNOSIS — M8580 Other specified disorders of bone density and structure, unspecified site: Secondary | ICD-10-CM | POA: Insufficient documentation

## 2023-04-28 NOTE — Assessment & Plan Note (Signed)
Hyperlipidemia:Low fat diet discussed and encouraged.   Lipid Panel  Lab Results  Component Value Date   CHOL 192 04/25/2023   HDL 55 04/25/2023   LDLCALC 123 (H) 04/25/2023   TRIG 77 04/25/2023   CHOLHDL 3.5 04/25/2023

## 2023-04-28 NOTE — Assessment & Plan Note (Signed)
Controlled, no change in medication DASH diet and commitment to daily physical activity for a minimum of 30 minutes discussed and encouraged, as a part of hypertension management. The importance of attaining a healthy weight is also discussed.     04/27/2023    3:44 PM 04/27/2023    3:14 PM 04/27/2023    3:13 PM 12/21/2022    2:49 PM 09/14/2022    3:02 PM 09/14/2022    2:25 PM 09/14/2022    2:21 PM  BP/Weight  Systolic BP 120 124 144  148 150 153  Diastolic BP 80 81 86  96 88 92  Wt. (Lbs)   231.04 229   229.12  BMI   43.65 kg/m2 43.27 kg/m2   43.29 kg/m2

## 2023-04-28 NOTE — Progress Notes (Signed)
[name]     MRN: [id number]      DOB: [birth year]  Chief Complaint  Patient presents with   Follow-up    Follow up discuss bone density    HPI [name] is here for follow up and re-evaluation of chronic medical conditions, medication management and review of any available recent lab and radiology data.  Preventive health is updated, specifically  Cancer screening and Immunization.   Questions or concerns regarding consultations or procedures which the PT has had in the interim are  addressed. The PT denies any adverse reactions to current medications since the last visit.  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 Denies sinus pressure, nasal congestion, ear pain or sore throat. Denies chest congestion, productive cough or wheezing. Denies chest pains, palpitations and leg swelling Denies abdominal pain, nausea, vomiting,diarrhea or constipation.   Denies dysuria, frequency, hesitancy or incontinence. Mild  joint pain, swelling and no significant  limitation in mobility. Denies headaches, seizures, numbness, or tingling. Denies depression, anxiety or insomnia. Denies skin break down or rash.   PE  BP 120/80   Pulse 84   Ht 5\' 1"  (1.549 m)   Wt 231 lb 0.6 oz (104.8 kg)   SpO2 97%   BMI 43.65 kg/m   Patient alert and oriented and in no cardiopulmonary distress.  HEENT: No facial asymmetry, EOMI,     Neck supple .  Chest: Clear to auscultation bilaterally.  CVS: S1, S2 no murmurs, no S3.Regular rate.  ABD: Soft non tender.   Ext: No edema  MS: Adequate ROM spine, shoulders, hips and reduced in  knees.  Skin: Intact, no ulcerations or rash noted.  Psych: Good eye contact,  normal affect. Memory intact not anxious or depressed appearing.  CNS: CN 2-12 intact, power,  normal throughout.no focal deficits noted.   Assessment & Plan Essential hypertension, benign Controlled, no change in medication DASH diet and commitment to daily physical activity for a minimum of 30 minutes discussed and encouraged, as a part of hypertension management. The importance of attaining a healthy weight is also discussed.     04/27/2023    3:44 PM 04/27/2023    3:14 PM 04/27/2023    3:13 PM 12/21/2022    2:49 PM 09/14/2022    3:02 PM 09/14/2022    2:25 PM 09/14/2022    2:21 PM  BP/Weight  Systolic BP 120 124 144  148 150 153  Diastolic BP 80 81 86  96 88 92  Wt. (Lbs)   231.04 229   229.12  BMI   43.65 kg/m2 43.27 kg/m2   43.29 kg/m2       Morbid obesity (HCC)  Patient re-educated about  the importance of commitment to a  minimum of 150 minutes of exercise per week as able.  The importance of healthy food choices with portion control discussed, as well as eating regularly and within a 12 hour window most days. The need to choose "clean , green" food 50 to 75% of the time is discussed, as well as to make water the primary drink and set a goal of 64 ounces water daily.       04/27/2023  3:13 PM 12/21/2022    2:49 PM 09/14/2022    2:21 PM  Weight /BMI  Weight 231 lb 0.6 oz 229 lb 229 lb 1.9 oz  Height 5\' 1"  (1.549 m) 5\' 1"  (1.549 m) 5\' 1"  (1.549 m)  BMI 43.65 kg/m2 43.27 kg/m2 43.29 kg/m2    Deteriorated start zepbound if can get PA through if not phentermine will be prescribed  Prediabetes Patient educated about the importance of limiting  Carbohydrate intake , the need to commit to daily physical activity for a minimum of 30 minutes , and to commit weight loss. The fact that changes in all these areas will reduce or eliminate all together the development of diabetes is stressed.  Deteriorated , resume metformin wice daily and refer to nutrition ed     Latest  Ref Rng & Units 04/25/2023   10:09 AM 06/22/2022   12:50 PM 10/28/2021   10:00 AM 05/06/2021   10:41 AM 11/06/2020    9:55 AM  Diabetic Labs  HbA1c 4.8 - 5.6 % 6.0  5.9  6.0   5.7   Chol 100 - 199 mg/dL 161  096  045     HDL >40 mg/dL 55  49  57     Calc LDL 0 - 99 mg/dL 981  191  478     Triglycerides 0 - 149 mg/dL 77  78  64     Creatinine 0.57 - 1.00 mg/dL 2.95  6.21  3.08  6.57        04/27/2023    3:44 PM 04/27/2023    3:14 PM 04/27/2023    3:13 PM 12/21/2022    2:49 PM 09/14/2022    3:02 PM 09/14/2022    2:25 PM 09/14/2022    2:21 PM  BP/Weight  Systolic BP 120 124 144  148 150 153  Diastolic BP 80 81 86  96 88 92  Wt. (Lbs)   231.04 229   229.12  BMI   43.65 kg/m2 43.27 kg/m2   43.29 kg/m2       No data to display            Osteopenia Pt educated re disease and importance of commitment to daily weight bearing exercise and calcium with Vit D supplementation  Dyslipidemia Hyperlipidemia:Low fat diet discussed and encouraged.   Lipid Panel  Lab Results  Component Value Date   CHOL 192 04/25/2023   HDL 55 04/25/2023   LDLCALC 123 (H) 04/25/2023   TRIG 77 04/25/2023   CHOLHDL 3.5 04/25/2023       Metabolic syndrome X The increased risk of cardiovascular disease associated with this diagnosis, and the need to consistently work on lifestyle to change this is discussed. Following  a  heart healthy diet ,commitment to 30 minutes of exercise at least 5 days per week, as well as control of blood sugar and cholesterol , and achieving a healthy weight are all the areas to be addressed .

## 2023-04-28 NOTE — Assessment & Plan Note (Signed)
  Patient re-educated about  the importance of commitment to a  minimum of 150 minutes of exercise per week as able.  The importance of healthy food choices with portion control discussed, as well as eating regularly and within a 12 hour window most days. The need to choose "clean , green" food 50 to 75% of the time is discussed, as well as to make water the primary drink and set a goal of 64 ounces water daily.       04/27/2023    3:13 PM 12/21/2022    2:49 PM 09/14/2022    2:21 PM  Weight /BMI  Weight 231 lb 0.6 oz 229 lb 229 lb 1.9 oz  Height 5\' 1"  (1.549 m) 5\' 1"  (1.549 m) 5\' 1"  (1.549 m)  BMI 43.65 kg/m2 43.27 kg/m2 43.29 kg/m2    Deteriorated start zepbound if can get PA through if not phentermine will be prescribed

## 2023-04-28 NOTE — Assessment & Plan Note (Signed)
Pt educated re disease and importance of commitment to daily weight bearing exercise and calcium with Vit D supplementation

## 2023-04-28 NOTE — Assessment & Plan Note (Signed)
Patient educated about the importance of limiting  Carbohydrate intake , the need to commit to daily physical activity for a minimum of 30 minutes , and to commit weight loss. The fact that changes in all these areas will reduce or eliminate all together the development of diabetes is stressed.  Deteriorated , resume metformin wice daily and refer to nutrition ed     Latest Ref Rng & Units 04/25/2023   10:09 AM 06/22/2022   12:50 PM 10/28/2021   10:00 AM 05/06/2021   10:41 AM 11/06/2020    9:55 AM  Diabetic Labs  HbA1c 4.8 - 5.6 % 6.0  5.9  6.0   5.7   Chol 100 - 199 mg/dL 188  416  606     HDL >30 mg/dL 55  49  57     Calc LDL 0 - 99 mg/dL 160  109  323     Triglycerides 0 - 149 mg/dL 77  78  64     Creatinine 0.57 - 1.00 mg/dL 5.57  3.22  0.25  4.27        04/27/2023    3:44 PM 04/27/2023    3:14 PM 04/27/2023    3:13 PM 12/21/2022    2:49 PM 09/14/2022    3:02 PM 09/14/2022    2:25 PM 09/14/2022    2:21 PM  BP/Weight  Systolic BP 120 124 144  148 150 153  Diastolic BP 80 81 86  96 88 92  Wt. (Lbs)   231.04 229   229.12  BMI   43.65 kg/m2 43.27 kg/m2   43.29 kg/m2       No data to display

## 2023-04-28 NOTE — Assessment & Plan Note (Signed)
The increased risk of cardiovascular disease associated with this diagnosis, and the need to consistently work on lifestyle to change this is discussed. Following  a  heart healthy diet ,commitment to 30 minutes of exercise at least 5 days per week, as well as control of blood sugar and cholesterol , and achieving a healthy weight are all the areas to be addressed .  

## 2023-05-16 ENCOUNTER — Telehealth: Payer: Self-pay | Admitting: Family Medicine

## 2023-05-16 ENCOUNTER — Emergency Department (HOSPITAL_COMMUNITY): Payer: BC Managed Care – PPO

## 2023-05-16 ENCOUNTER — Emergency Department (HOSPITAL_COMMUNITY)
Admission: EM | Admit: 2023-05-16 | Discharge: 2023-05-16 | Disposition: A | Discharge status: Home / Self Care | Payer: BC Managed Care – PPO | Attending: Emergency Medicine | Admitting: Emergency Medicine

## 2023-05-16 ENCOUNTER — Other Ambulatory Visit: Payer: Self-pay

## 2023-05-16 DIAGNOSIS — E079 Disorder of thyroid, unspecified: Secondary | ICD-10-CM | POA: Diagnosis not present

## 2023-05-16 DIAGNOSIS — E041 Nontoxic single thyroid nodule: Secondary | ICD-10-CM | POA: Insufficient documentation

## 2023-05-16 DIAGNOSIS — Z6841 Body Mass Index (BMI) 40.0 and over, adult: Secondary | ICD-10-CM | POA: Diagnosis not present

## 2023-05-16 DIAGNOSIS — M47812 Spondylosis without myelopathy or radiculopathy, cervical region: Secondary | ICD-10-CM | POA: Diagnosis not present

## 2023-05-16 DIAGNOSIS — R221 Localized swelling, mass and lump, neck: Secondary | ICD-10-CM | POA: Diagnosis not present

## 2023-05-16 LAB — I-STAT CHEM 8, ED
BUN: 13 mg/dL (ref 8–23)
Calcium, Ion: 1.18 mmol/L (ref 1.15–1.40)
Chloride: 102 mmol/L (ref 98–111)
Creatinine, Ser: 0.9 mg/dL (ref 0.44–1.00)
Glucose, Bld: 96 mg/dL (ref 70–99)
HCT: 41 % (ref 36.0–46.0)
Hemoglobin: 13.9 g/dL (ref 12.0–15.0)
Potassium: 3.4 mmol/L — ABNORMAL LOW (ref 3.5–5.1)
Sodium: 140 mmol/L (ref 135–145)
TCO2: 26 mmol/L (ref 22–32)

## 2023-05-16 LAB — CBC
HCT: 39.8 % (ref 36.0–46.0)
Hemoglobin: 13.2 g/dL (ref 12.0–15.0)
MCH: 29.8 pg (ref 26.0–34.0)
MCHC: 33.2 g/dL (ref 30.0–36.0)
MCV: 89.8 fL (ref 80.0–100.0)
Platelets: 215 10*3/uL (ref 150–400)
RBC: 4.43 MIL/uL (ref 3.87–5.11)
RDW: 12.6 % (ref 11.5–15.5)
WBC: 7.5 10*3/uL (ref 4.0–10.5)
nRBC: 0 % (ref 0.0–0.2)

## 2023-05-16 LAB — BASIC METABOLIC PANEL
Anion gap: 8 (ref 5–15)
BUN: 12 mg/dL (ref 8–23)
CO2: 27 mmol/L (ref 22–32)
Calcium: 9.2 mg/dL (ref 8.9–10.3)
Chloride: 102 mmol/L (ref 98–111)
Creatinine, Ser: 0.93 mg/dL (ref 0.44–1.00)
GFR, Estimated: >60 mL/min (ref >60)
Glucose, Bld: 99 mg/dL (ref 70–99)
Potassium: 3.3 mmol/L — ABNORMAL LOW (ref 3.5–5.1)
Sodium: 137 mmol/L (ref 135–145)

## 2023-05-16 LAB — TSH: TSH: 1.717 u[IU]/mL (ref 0.350–4.500)

## 2023-05-16 MED ORDER — IOHEXOL 350 MG/ML SOLN
75.0000 mL | Freq: Once | INTRAVENOUS | Status: AC | PRN
  Administered 2023-05-16: 75 mL via INTRAVENOUS

## 2023-05-16 NOTE — Telephone Encounter (Signed)
Called patient for an appointment per patient will go to the ER

## 2023-05-16 NOTE — ED Provider Notes (Signed)
[place] EMERGENCY DEPARTMENT AT [facility] Provider Note   CSN: [id number] Arrival date & time: 05/16/23  1552     History  Chief Complaint  Patient presents with   Swelling Lt side Neck    Megan Morales is a 67 y.o. female who arrives for evaluation of swelling of the neck.  Patient states that she first noticed swelling on the left side of her neck 2 days ago.  Since that time she has had progressively worsening swelling on the left anterior neck.  She denies any pain, trouble swallowing, unexplained weight loss, fevers, change in voice.  She is never had anything like this before.  She denies cough or shortness of breath.  HPI     Home Medications Prior to Admission medications   Medication Sig Start Date End Date Taking? Authorizing Provider  albuterol (VENTOLIN HFA) 108 (90 Base) MCG/ACT inhaler Inhale 2 puffs into the lungs every 6 (six) hours as needed for wheezing or shortness of breath. 12/21/22   Del Newman Nip, Tenna Child, FNP  amLODipine (NORVASC) 10 MG tablet TAKE 1 TABLET(10 MG) BY MOUTH DAILY 04/18/23   Kerri Perches, MD  Azelastine-Fluticasone 137-50 MCG/ACT SUSP Place 1 spray into the nose every 12 (twelve) hours. 12/21/22   Del Newman Nip, Tenna Child, FNP  benazepril-hydrochlorthiazide (LOTENSIN HCT) 20-12.5 MG tablet TAKE 2 TABLETS BY MOUTH EVERY DAY FOR BLOOD PRESSURE 11/21/22   Kerri Perches, MD  Calcium Carbonate-Vitamin D 600-400 MG-UNIT tablet Take 1 tablet by mouth daily.    [provider]  carvedilol (COREG) 3.125 MG tablet Take 1 tablet (3.125 mg total) by mouth 2 (two) times daily with a meal. 09/14/22   Kerri Perches, MD  clotrimazole-betamethasone (LOTRISONE) cream Apply 1 Application topically 2 (two) times daily. 06/22/22   Kerri Perches, MD  ergocalciferol (VITAMIN D2) 1.25 MG (50000 UT) capsule Take 1 capsule (50,000 Units total) by mouth once a week. One capsule once weekly 04/27/23   Kerri Perches, MD   metFORMIN (GLUCOPHAGE) 500 MG tablet Take 1 tablet (500 mg total) by mouth 2 (two) times daily with a meal. 04/27/23   Kerri Perches, MD  Multiple Vitamin (MULTIVITAMIN) capsule Take 1 capsule by mouth daily.    [provider]  potassium chloride (KLOR-CON) 10 MEQ tablet TAKE 1 TABLET(10 MEQ) BY MOUTH DAILY 11/21/22   Kerri Perches, MD  tirzepatide [facility]) 2.5 MG/0.5ML Pen Inject 2.5 mg into the skin once a week. 04/27/23   Kerri Perches, MD      Allergies    Naproxen, Codeine, and Tramadol    Review of Systems   Review of Systems  Physical Exam Updated Vital Signs BP (!) 167/97   Pulse 90   Temp 98.3 F (36.8 C) (Oral)   Resp 18   Ht 5\' 1"  (1.549 m)   Wt 103 kg   SpO2 100%   BMI 42.89 kg/m  Physical Exam Vitals and nursing note reviewed.  Constitutional:      General: She is not in acute distress.    Appearance: She is well-developed. She is not diaphoretic.  HENT:     Head: Normocephalic and atraumatic.     Right Ear: External ear normal.     Left Ear: External ear normal.     Nose: Nose normal.     Mouth/Throat:     Mouth: Mucous membranes are moist.  Eyes:     General: No scleral icterus.    Conjunctiva/sclera:  Conjunctivae normal.  Neck:     Comments: Swelling that begins just above the sternum extends over the left SCM and laterally.  It is soft and nontender without erythema.  No sublingual swelling, full range of motion of the neck, no abnormal phonation.  Palate and pharynx are pink without swelling, uvula midline Cardiovascular:     Rate and Rhythm: Normal rate and regular rhythm.     Heart sounds: Normal heart sounds. No murmur heard.    No friction rub. No gallop.  Pulmonary:     Effort: Pulmonary effort is normal. No respiratory distress.     Breath sounds: Normal breath sounds.  Abdominal:     General: Bowel sounds are normal. There is no distension.     Palpations: Abdomen is soft. There is no mass.     Tenderness: There  is no abdominal tenderness. There is no guarding.  Musculoskeletal:     Cervical back: Normal range of motion and neck supple. No tenderness.  Skin:    General: Skin is warm and dry.  Neurological:     Mental Status: She is alert and oriented to person, place, and time.  Psychiatric:        Behavior: Behavior normal.     ED Results / Procedures / Treatments   Labs (all labs ordered are listed, but only abnormal results are displayed) Labs Reviewed - No data to display  EKG None  Radiology No results found.  Procedures Procedures    Medications Ordered in ED Medications - No data to display  ED Course/ Medical Decision Making/ A&P Clinical Course as of 05/18/23 0929  Tue May 16, 2023  1945 CT Soft Tissue Neck W Contrast Visualized and interpreted CT soft tissue of the neck.  I discussed the case via phone call with Dr. Ernestene Kiel and he is going to review the images and discussed the case with me once he has had time to assess the images. [AH]    Clinical Course User Index [AH] Arthor Captain, PA-C                                 Medical Decision Making 67 year old female who presents emergency department chief complaint of anterior neck swelling.  Differential diagnosis includes thyroglossal duct cyst, thyroid nodule, thyroid cancer, less likely lymphadenopathy, deep space infection.  I ordered and interpreted CT soft tissue of the neck.  There is a 7-1/2 cm fluid containing cyst arising from the left side of the thyroid and a agree with radiologic interpretation.  I discussed all findings and reviewed images with Dr. Ernestene Kiel. In shared decision making as patient does not have an elevated white blood cell count is not having any systemic symptoms has normal phonation no problems swallowing, no pain feel that she does not need emergency admission for airway obstruction.  She has no impending compromise.  I also discussed these findings with the patient and she feels very  comfortable returning should she have any new or worsening symptoms.  I ordered and reviewed labs which showed no pertinent findings.  Patient will be discharged to follow closely in clinic with the ENT. I discussed reasons to seek immediate medical care with the patient and provided specific instructions in her discharge paperwork.  Patient appears otherwise appropriate for discharge at this time.  Amount and/or Complexity of Data Reviewed Labs: ordered. Radiology: ordered. Decision-making details documented in ED Course.  Risk Prescription drug  management.           Final Clinical Impression(s) / ED Diagnoses Final diagnoses:  None    Rx / DC Orders ED Discharge Orders     None         Laya, Bache, PA-C 05/18/23 0932    Lorre Nick, MD 05/22/23 1444

## 2023-05-16 NOTE — ED Triage Notes (Signed)
Pt came in via POV d/t swelling on the Lt side of her neck that she noticed 2 days ago, denies difficulty breathing or coming in contact with anything she is allergic to. A/Ox4, denies pain.

## 2023-05-16 NOTE — Discharge Instructions (Signed)
You were seen in the emergency department.  Your labs are reassuring.  Please follow closely with Dr. Jennette Bill in the office this upcoming week for ultrasound-guided assessment of the cyst on your thyroid as well as likely aspiration and biopsy.  Get help right away in the emergency department if you are unable to breathe when sleeping flat, you have change in your voice, trouble swallowing, noisy breathing, increased swelling, rapid or irregular heartbeat, nervousness and shaking, rapid weight loss, chest pain, trouble swallowing, or any difficulty breathing.

## 2023-05-16 NOTE — Telephone Encounter (Signed)
Patient calling says she has two knots in her neck that are getting bigger and is wanting to speak with somebody asap. Please advise Thank you

## 2023-05-17 ENCOUNTER — Telehealth: Payer: Self-pay | Admitting: Family Medicine

## 2023-05-17 DIAGNOSIS — E041 Nontoxic single thyroid nodule: Secondary | ICD-10-CM

## 2023-05-17 NOTE — Telephone Encounter (Signed)
ED visi review, needs thyroid US and f/u with ENT ,  Dr Ernestene Kiel, pt aware and our office to arrange both

## 2023-05-18 DIAGNOSIS — R221 Localized swelling, mass and lump, neck: Secondary | ICD-10-CM | POA: Diagnosis not present

## 2023-05-18 DIAGNOSIS — E041 Nontoxic single thyroid nodule: Secondary | ICD-10-CM | POA: Insufficient documentation

## 2023-05-22 ENCOUNTER — Other Ambulatory Visit (HOSPITAL_COMMUNITY): Payer: Self-pay | Admitting: Otolaryngology

## 2023-05-22 DIAGNOSIS — R221 Localized swelling, mass and lump, neck: Secondary | ICD-10-CM

## 2023-05-22 DIAGNOSIS — E041 Nontoxic single thyroid nodule: Secondary | ICD-10-CM

## 2023-05-23 ENCOUNTER — Ambulatory Visit (HOSPITAL_COMMUNITY)
Admission: RE | Admit: 2023-05-23 | Discharge: 2023-05-23 | Disposition: A | Discharge status: Home / Self Care | Payer: BC Managed Care – PPO | Source: Ambulatory Visit | Attending: Family Medicine | Admitting: Family Medicine

## 2023-05-23 DIAGNOSIS — E041 Nontoxic single thyroid nodule: Secondary | ICD-10-CM | POA: Diagnosis not present

## 2023-05-23 DIAGNOSIS — E042 Nontoxic multinodular goiter: Secondary | ICD-10-CM | POA: Diagnosis not present

## 2023-05-28 NOTE — Telephone Encounter (Signed)
Contact was made with pt re need for [place] of thyroid and evaluation by ENT, having reviewed recent ED visit

## 2023-06-07 DIAGNOSIS — M79672 Pain in left foot: Secondary | ICD-10-CM | POA: Diagnosis not present

## 2023-06-07 DIAGNOSIS — X501XXA Overexertion from prolonged static or awkward postures, initial encounter: Secondary | ICD-10-CM | POA: Diagnosis not present

## 2023-06-07 DIAGNOSIS — M7732 Calcaneal spur, left foot: Secondary | ICD-10-CM | POA: Diagnosis not present

## 2023-06-07 DIAGNOSIS — M79671 Pain in right foot: Secondary | ICD-10-CM | POA: Diagnosis not present

## 2023-06-07 DIAGNOSIS — S93602A Unspecified sprain of left foot, initial encounter: Secondary | ICD-10-CM | POA: Diagnosis not present

## 2023-06-22 ENCOUNTER — Encounter: Payer: BC Managed Care – PPO | Attending: Family Medicine | Admitting: Nutrition

## 2023-06-22 VITALS — Ht 61.0 in | Wt 228.0 lb

## 2023-06-22 DIAGNOSIS — R7303 Prediabetes: Secondary | ICD-10-CM | POA: Insufficient documentation

## 2023-06-22 DIAGNOSIS — I10 Essential (primary) hypertension: Secondary | ICD-10-CM | POA: Insufficient documentation

## 2023-06-22 NOTE — Patient Instructions (Signed)
Goals  Drink 3 bottles of water per day Increase whole plant based foods from a garden Eat meals on time Walk 15 minutes 5 days a week. Lose 1 lb per week Cut out processed foods and fast foods.

## 2023-06-22 NOTE — Progress Notes (Signed)
Medical Nutrition Therapy  Appointment Start time:  [phone]  Appointment End time:  [phone] Primary concerns today: Pre DM and Obesity  Referral diagnosis: E66.01, R73.03 Preferred learning style: No Preference  Learning readiness: Ready   NUTRITION ASSESSMENT  67 yr old bfemale referred for obesity and Pre DM. A1C 6% BMI 43. PCP Dr. Lodema Hong  She is willing to work with [facility] to focus on more whole plant based foods and the 6 pillars of health to provide desired weight loss and reduce medical issues.  Clinical Medical Hx: Past Medical History:  Diagnosis Date   Hypertension     Medications:  Current Outpatient Medications on File Prior to Visit  Medication Sig Dispense Refill   albuterol (VENTOLIN HFA) 108 (90 Base) MCG/ACT inhaler Inhale 2 puffs into the lungs every 6 (six) hours as needed for wheezing or shortness of breath. 8 g 2   amLODipine (NORVASC) 10 MG tablet TAKE 1 TABLET(10 MG) BY MOUTH DAILY 90 tablet 2   Azelastine-Fluticasone 137-50 MCG/ACT SUSP Place 1 spray into the nose every 12 (twelve) hours. 23 g 1   benazepril-hydrochlorthiazide (LOTENSIN HCT) 20-12.5 MG tablet TAKE 2 TABLETS BY MOUTH EVERY DAY FOR BLOOD PRESSURE 180 tablet 2   Calcium Carbonate-Vitamin D 600-400 MG-UNIT tablet Take 1 tablet by mouth daily.     carvedilol (COREG) 3.125 MG tablet Take 1 tablet (3.125 mg total) by mouth 2 (two) times daily with a meal. 60 tablet 3   clotrimazole-betamethasone (LOTRISONE) cream Apply 1 Application topically 2 (two) times daily. 45 g 1   ergocalciferol (VITAMIN D2) 1.25 MG (50000 UT) capsule Take 1 capsule (50,000 Units total) by mouth once a week. One capsule once weekly 12 capsule 2   metFORMIN (GLUCOPHAGE) 500 MG tablet Take 1 tablet (500 mg total) by mouth 2 (two) times daily with a meal. 60 tablet 4   Multiple Vitamin (MULTIVITAMIN) capsule Take 1 capsule by mouth daily.     potassium chloride (KLOR-CON) 10 MEQ tablet TAKE 1 TABLET(10 MEQ) BY MOUTH  DAILY 90 tablet 2   tirzepatide (ZEPBOUND) 2.5 MG/0.5ML Pen Inject 2.5 mg into the skin once a week. 2 mL 0   No current facility-administered medications on file prior to visit.    Labs:     Latest Ref Rng & Units 05/16/2023    6:29 PM 05/16/2023    6:22 PM 04/25/2023   10:09 AM  CMP  Glucose 70 - 99 mg/dL 96  99  85   BUN 8 - 23 mg/dL 13  12  16    Creatinine 0.44 - 1.00 mg/dL 8.11  9.14  7.82   Sodium 135 - 145 mmol/L 140  137  143   Potassium 3.5 - 5.1 mmol/L 3.4  3.3  4.1   Chloride 98 - 111 mmol/L 102  102  103   CO2 22 - 32 mmol/L  27  25   Calcium 8.9 - 10.3 mg/dL  9.2  9.2   Total Protein 6.0 - 8.5 g/dL   7.6   Total Bilirubin 0.0 - 1.2 mg/dL   0.2   Alkaline Phos 44 - 121 IU/L   139   AST 0 - 40 IU/L   18   ALT 0 - 32 IU/L   14    Lipid Panel     Component Value Date/Time   CHOL 192 04/25/2023 1009   TRIG 77 04/25/2023 1009   HDL 55 04/25/2023 1009   CHOLHDL 3.5 04/25/2023 1009  CHOLHDL 3.7 05/07/2019 0935   VLDL 11 07/13/2016 0903   LDLCALC 123 (H) 04/25/2023 1009   LDLCALC 120 (H) 05/07/2019 0935   LABVLDL 14 04/25/2023 1009   Lab Results  Component Value Date   HGBA1C 6.0 (H) 04/25/2023    Notable Signs/Symptoms: Non3  Lifestyle & Dietary Hx Lives with husband Eat at home and eats out  some  Doesn't cook a lot. Estimated daily fluid intake: 16 oz Supplements:  Sleep:  Stress / self-care:  Current average weekly physical activity: ADL  24-Hr Dietary Recall First Meal: Skips or sausage, egg and 1 slice bread Snack:  Second Meal: Leftovers or eats out Snack:  Third Meal: Eats out fast food often; 1/2 hamburger and fries split with husband. water Snack:  Beverages: water, sparkling water   Estimated Energy Needs Calories: 1200 Carbohydrate: 135g Protein: 90g Fat: 33g   NUTRITION DIAGNOSIS  NB-1.1 Food and nutrition-related knowledge deficit As related to excessive calorie intake .  As evidenced by BMI 43 and A1C 6%.   NUTRITION  INTERVENTION  Nutrition education (E-1) on the following topics:  Nutrition and :Pre Diabetes education provided on My Plate, CHO counting, meal planning, portion sizes, timing of meals, avoiding snacks between meals unless having a low blood sugar, target ranges for A1C and blood sugars, signs/symptoms and treatment of hyper/hypoglycemia, monitoring blood sugars, taking medications as prescribed, benefits of exercising 30 minutes per day and prevention of complications of DM.  Lifestyle Medicine  - Whole Food, Plant Predominant Nutrition is highly recommended: Eat Plenty of vegetables, Mushrooms, fruits, Legumes, Whole Grains, Nuts, seeds in lieu of processed meats, processed snacks/pastries red meat, poultry, eggs.    -It is better to avoid simple carbohydrates including: Cakes, Sweet Desserts, Ice Cream, Soda (diet and regular), Sweet Tea, Candies, Chips, Cookies, Store Bought Juices, Alcohol in Excess of  1-2 drinks a day, Lemonade,  Artificial Sweeteners, Doughnuts, Coffee Creamers, "Sugar-free" Products, etc, etc.  This is not a complete list.....  Exercise: If you are able: 30 -60 minutes a day ,4 days a week, or 150 minutes a week.  The longer the better.  Combine stretch, strength, and aerobic activities.  If you were told in the past that you have high risk for cardiovascular diseases, you may seek evaluation by your heart doctor prior to initiating moderate to intense exercise programs.    Handouts Provided Include  Know your numbers Lifestyle Medicine handouts  Learning Style & Readiness for Change Teaching method utilized: Visual & Auditory  Demonstrated degree of understanding via: Teach Back  Barriers to learning/adherence to lifestyle change: none  Goals Established by Pt Goals  Drink 3 bottles of water per day Increase whole plant based foods from a garden Eat meals on time Walk 15 minutes 5 days a week. Lose 1 lb per week Cut out processed foods and fast  foods.   MONITORING & EVALUATION Dietary intake, weekly physical activity, and Weight in 1 month.  Next Steps  Patient is to work on meal planning and foods from garden and cut back on eating out.Marland Kitchen

## 2023-06-27 ENCOUNTER — Encounter: Payer: Self-pay | Admitting: Nutrition

## 2023-07-13 ENCOUNTER — Encounter: Payer: Self-pay | Admitting: Family Medicine

## 2023-07-13 ENCOUNTER — Ambulatory Visit: Payer: BC Managed Care – PPO | Admitting: Family Medicine

## 2023-07-13 VITALS — BP 160/100 | HR 81 | Ht 61.0 in | Wt 222.1 lb

## 2023-07-13 DIAGNOSIS — Z23 Encounter for immunization: Secondary | ICD-10-CM | POA: Diagnosis not present

## 2023-07-13 DIAGNOSIS — R7303 Prediabetes: Secondary | ICD-10-CM | POA: Diagnosis not present

## 2023-07-13 DIAGNOSIS — E042 Nontoxic multinodular goiter: Secondary | ICD-10-CM

## 2023-07-13 DIAGNOSIS — Z13220 Encounter for screening for lipoid disorders: Secondary | ICD-10-CM

## 2023-07-13 DIAGNOSIS — E785 Hyperlipidemia, unspecified: Secondary | ICD-10-CM

## 2023-07-13 DIAGNOSIS — I10 Essential (primary) hypertension: Secondary | ICD-10-CM | POA: Diagnosis not present

## 2023-07-13 MED ORDER — CARVEDILOL 3.125 MG PO TABS: 3.1250 mg | ORAL_TABLET | Freq: Two times a day (BID) | ORAL | 3 refills | Status: DC

## 2023-07-13 MED ORDER — OLMESARTAN MEDOXOMIL-HCTZ 40-25 MG PO TABS: 1.0000 | ORAL_TABLET | Freq: Every day | ORAL | 5 refills | Status: DC

## 2023-07-13 MED ORDER — AMLODIPINE BESYLATE 10 MG PO TABS
10.0000 mg | ORAL_TABLET | Freq: Every day | ORAL | 3 refills | Status: DC
Start: 2023-07-13 — End: 2024-04-16

## 2023-07-13 NOTE — Patient Instructions (Addendum)
F/u 2nd week in December , re evaluate blood pressure, call if you need me sooner  Flu vaccine today  Please bring or fax copy of your pneumoonia vaccine to office   Fasting lipid, cmp and eGFr , and hBA1c 3 to 5 days before next visit  NEED to take bP  meds at 8am and 8 pm every day for them to work  [employer] on change in diet , with great success  Meds for BP  Amlodipine 10 mg oNE daily  Olmesartan/hydrochlorothiazide 40/25 oNE daily ( rplacing benazepril, so stop that)  Carvedilol 3.125 one at 8 am and one at 8 pm  Ok to watch the cyst  in neck  It is important that you exercise regularly at least 30 minutes 5 times a week. If you develop chest pain, have severe difficulty breathing, or feel very tired, stop exercising immediately and seek medical attention    Thanks for choosing [facility], we consider it a privelige to serve you.

## 2023-07-18 DIAGNOSIS — E042 Nontoxic multinodular goiter: Secondary | ICD-10-CM | POA: Insufficient documentation

## 2023-07-18 DIAGNOSIS — Z23 Encounter for immunization: Secondary | ICD-10-CM | POA: Insufficient documentation

## 2023-07-18 NOTE — Assessment & Plan Note (Signed)
Patient educated about the importance of limiting  Carbohydrate intake , the need to commit to daily physical activity for a minimum of 30 minutes , and to commit weight loss. The fact that changes in all these areas will reduce or eliminate all together the development of diabetes is stressed.      Latest Ref Rng & Units 05/16/2023    6:29 PM 05/16/2023    6:22 PM 04/25/2023   10:09 AM 06/22/2022   12:50 PM 10/28/2021   10:00 AM  Diabetic Labs  HbA1c 4.8 - 5.6 %   6.0  5.9  6.0   Chol 100 - 199 mg/dL   409  811  914   HDL >78 mg/dL   55  49  57   Calc LDL 0 - 99 mg/dL   295  621  308   Triglycerides 0 - 149 mg/dL   77  78  64   Creatinine 0.44 - 1.00 mg/dL 6.57  8.46  9.62  9.52  0.98       07/13/2023    9:11 AM 07/13/2023    8:51 AM 07/13/2023    8:49 AM 06/22/2023    8:54 AM 05/16/2023    4:46 PM 05/16/2023    3:58 PM 04/27/2023    3:44 PM  BP/Weight  Systolic BP 160 149 165   167 841  Diastolic BP 100 87 95   97 80  Wt. (Lbs)   222.12 228 227    BMI   41.97 kg/m2 43.08 kg/m2 42.89 kg/m2         No data to display          Updated lab needed at/ before next visit.

## 2023-07-18 NOTE — Progress Notes (Signed)
Megan Morales     MRN: [id number]      DOB: [birth year]  Chief Complaint  Patient presents with   Follow-up    Follow up    HPI Megan Morales is here for follow up and re-evaluation of chronic medical conditions, medication management and review of any available recent lab and radiology data.  Preventive health is updated, specifically  Cancer screening and Immunization.   Questions or concerns regarding consultations or procedures which the PT has had in the interim are  addressed. Was evaluated by ENT re cyst in left neck, watchful waiting is the decsion determined to follow up  on this Has changed food choice with dx of prediabetes and has had weight loss success which is impressive The PT denies any adverse reactions to current medications since the last visit.  There are no new concerns.  There are no specific complaints   ROS Denies recent fever or chills. Denies sinus pressure, nasal congestion, ear pain or sore throat. Denies chest congestion, productive cough or wheezing. Denies chest pains, palpitations and leg swelling Denies abdominal pain, nausea, vomiting,diarrhea or constipation.   Denies dysuria, frequency, hesitancy or incontinence. Denies joint pain, swelling and limitation in mobility. Denies headaches, seizures, numbness, or tingling. Denies depression, anxiety or insomnia. Denies skin break down or rash.   PE  BP (!) 160/100   Pulse 81   Ht 5\' 1"  (1.549 m)   Wt 222 lb 1.9 oz (100.8 kg)   SpO2 95%   BMI 41.97 kg/m   Patient alert and oriented and in no cardiopulmonary distress.  HEENT: No facial asymmetry, EOMI,     Neck supple .Thyromegaly with left thyroid cyst, nodule in right upper lobe  Chest: Clear to auscultation bilaterally.  CVS: S1, S2 no murmurs, no S3.Regular rate.  ABD: Soft non tender.   Ext: No edema  MS: Adequate ROM spine, shoulders, hips and knees.  Skin: Intact, no ulcerations or rash noted.  Psych: Good eye contact,  normal affect. Memory intact not anxious or depressed appearing.  CNS: CN 2-12 intact, power,  normal throughout.no focal deficits noted.   Assessment & Plan  Essential hypertension, benign Uncontrolled , start benicar/hydrochlorothiazide , other meds as before, with compliance stressed DASH diet and commitment to daily physical activity for a minimum of 30 minutes discussed and encouraged, as a part of hypertension management. The importance of attaining a healthy weight is also discussed.     07/13/2023    9:11 AM 07/13/2023    8:51 AM 07/13/2023    8:49 AM 06/22/2023    8:54 AM 05/16/2023    4:46 PM 05/16/2023    3:58 PM 04/27/2023    3:44 PM  BP/Weight  Systolic BP 160 149 165   167 401  Diastolic BP 100 87 95   97 80  Wt. (Lbs)   222.12 228 227    BMI   41.97 kg/m2 43.08 kg/m2 42.89 kg/m2         Prediabetes Patient educated about the importance of limiting  Carbohydrate intake , the need to commit to daily physical activity for a minimum of 30 minutes , and to commit weight loss. The fact that changes in all these areas will reduce or eliminate all together the development of diabetes is stressed.      Latest Ref Rng & Units 05/16/2023    6:29 PM 05/16/2023    6:22 PM 04/25/2023   10:09 AM 06/22/2022   12:50 PM 10/28/2021  10:00 AM  Diabetic Labs  HbA1c 4.8 - 5.6 %   6.0  5.9  6.0   Chol 100 - 199 mg/dL   960  454  098   HDL >11 mg/dL   55  49  57   Calc LDL 0 - 99 mg/dL   914  782  956   Triglycerides 0 - 149 mg/dL   77  78  64   Creatinine 0.44 - 1.00 mg/dL 2.13  0.86  5.78  4.69  0.98       07/13/2023    9:11 AM 07/13/2023    8:51 AM 07/13/2023    8:49 AM 06/22/2023    8:54 AM 05/16/2023    4:46 PM 05/16/2023    3:58 PM 04/27/2023    3:44 PM  BP/Weight  Systolic BP 160 149 165   167 629  Diastolic BP 100 87 95   97 80  Wt. (Lbs)   222.12 228 227    BMI   41.97 kg/m2 43.08 kg/m2 42.89 kg/m2         No data to display          Updated lab needed at/  before next visit.   Morbid obesity (HCC)  Patient re-educated about  the importance of commitment to a  minimum of 150 minutes of exercise per week as able.  The importance of healthy food choices with portion control discussed, as well as eating regularly and within a 12 hour window most days. The need to choose "clean , green" food 50 to 75% of the time is discussed, as well as to make water the primary drink and set a goal of 64 ounces water daily.       07/13/2023    8:49 AM 06/22/2023    8:54 AM 05/16/2023    4:46 PM  Weight /BMI  Weight 222 lb 1.9 oz 228 lb 227 lb  Height 5\' 1"  (1.549 m) 5\' 1"  (1.549 m) 5\' 1"  (1.549 m)  BMI 41.97 kg/m2 43.08 kg/m2 42.89 kg/m2      Dyslipidemia Hyperlipidemia:Low fat diet discussed and encouraged.   Lipid Panel  Lab Results  Component Value Date   CHOL 192 04/25/2023   HDL 55 04/25/2023   LDLCALC 123 (H) 04/25/2023   TRIG 77 04/25/2023   CHOLHDL 3.5 04/25/2023     Updated lab needed at/ before next visit.   Multinodular goiter Asymptomatic, right thyroid nodule and large left thyroid cyst, being followed by ENT

## 2023-07-18 NOTE — Assessment & Plan Note (Signed)
Hyperlipidemia:Low fat diet discussed and encouraged.   Lipid Panel  Lab Results  Component Value Date   CHOL 192 04/25/2023   HDL 55 04/25/2023   LDLCALC 123 (H) 04/25/2023   TRIG 77 04/25/2023   CHOLHDL 3.5 04/25/2023     Updated lab needed at/ before next visit.

## 2023-07-18 NOTE — Assessment & Plan Note (Signed)
Uncontrolled , start benicar/hydrochlorothiazide , other meds as before, with compliance stressed DASH diet and commitment to daily physical activity for a minimum of 30 minutes discussed and encouraged, as a part of hypertension management. The importance of attaining a healthy weight is also discussed.     07/13/2023    9:11 AM 07/13/2023    8:51 AM 07/13/2023    8:49 AM 06/22/2023    8:54 AM 05/16/2023    4:46 PM 05/16/2023    3:58 PM 04/27/2023    3:44 PM  BP/Weight  Systolic BP 160 149 165   167 237  Diastolic BP 100 87 95   97 80  Wt. (Lbs)   222.12 228 227    BMI   41.97 kg/m2 43.08 kg/m2 42.89 kg/m2

## 2023-07-18 NOTE — Assessment & Plan Note (Signed)
Asymptomatic, right thyroid nodule and large left thyroid cyst, being followed by ENT

## 2023-07-18 NOTE — Assessment & Plan Note (Signed)
  Patient re-educated about  the importance of commitment to a  minimum of 150 minutes of exercise per week as able.  The importance of healthy food choices with portion control discussed, as well as eating regularly and within a 12 hour window most days. The need to choose "clean , green" food 50 to 75% of the time is discussed, as well as to make water the primary drink and set a goal of 64 ounces water daily.       07/13/2023    8:49 AM 06/22/2023    8:54 AM 05/16/2023    4:46 PM  Weight /BMI  Weight 222 lb 1.9 oz 228 lb 227 lb  Height 5\' 1"  (1.549 m) 5\' 1"  (1.549 m) 5\' 1"  (1.549 m)  BMI 41.97 kg/m2 43.08 kg/m2 42.89 kg/m2

## 2023-08-09 ENCOUNTER — Encounter: Payer: BC Managed Care – PPO | Attending: Family Medicine | Admitting: Nutrition

## 2023-08-09 VITALS — Ht 61.0 in | Wt 223.6 lb

## 2023-08-09 DIAGNOSIS — I10 Essential (primary) hypertension: Secondary | ICD-10-CM | POA: Insufficient documentation

## 2023-08-09 DIAGNOSIS — R7303 Prediabetes: Secondary | ICD-10-CM | POA: Insufficient documentation

## 2023-08-09 NOTE — Patient Instructions (Addendum)
Goals  Start working out with your daughter to PF 3 times per week for 30 minutes. Lose 2 lbs per months Drink 5 bottles of water per day

## 2023-08-09 NOTE — Progress Notes (Unsigned)
Medical Nutrition Therapy  Appointment Start time:  1500  Appointment End time:  1530  Primary concerns today: Pre DM and Obesity  Referral diagnosis: E66.01, R73.03 Preferred learning style: No Preference  Learning readiness: Ready   NUTRITION ASSESSMENT Follow up 67 yr old bfemale referred for obesity and Pre DM. A1C 6% BMI 43. PCP Dr. Lodema Hong Cut out seconds. Eating meals on time. Eating more frozen vegetables. Feel a lot better.   Lost 5 lbs. Clothes fit better. Sleeping better. Feels a lot better. Digestion is better and BM are more regular now. Goals set previously  Drink 3 bottles of water per day- some improvement; still working on increasing water intake. Increase whole plant based foods from a garden- done Eat meals on time- done Walk 15 minutes 5 days a week.- Lose 1 lb per week Cut out processed foods and fast foods.-done.    She is willing to work with [facility] to focus on more whole plant based foods and the 6 pillars of health to provide desired weight loss and reduce medical issues.  Clinical Wt Readings from Last 3 Encounters:  08/09/23 223 lb 9.6 oz (101.4 kg)  07/13/23 222 lb 1.9 oz (100.8 kg)  06/22/23 228 lb (103.4 kg)   Ht Readings from Last 3 Encounters:  08/09/23 5\' 1"  (1.549 m)  07/13/23 5\' 1"  (1.549 m)  06/22/23 5\' 1"  (1.549 m)   Body mass index is 42.25 kg/m. @BMIFA @ Facility age limit for growth %iles is 20 years. Facility age limit for growth %iles is 20 years.  Medical Hx: Past Medical History:  Diagnosis Date   Hypertension     Medications:  Current Outpatient Medications on File Prior to Visit  Medication Sig Dispense Refill   amLODipine (NORVASC) 10 MG tablet Take 1 tablet (10 mg total) by mouth daily. 90 tablet 3   Calcium Carbonate-Vitamin D 600-400 MG-UNIT tablet Take 1 tablet by mouth daily.     carvedilol (COREG) 3.125 MG tablet Take 1 tablet (3.125 mg total) by mouth 2 (two) times daily with a meal. 60 tablet 3    ergocalciferol (VITAMIN D2) 1.25 MG (50000 UT) capsule Take 1 capsule (50,000 Units total) by mouth once a week. One capsule once weekly 12 capsule 2   metFORMIN (GLUCOPHAGE) 500 MG tablet Take 1 tablet (500 mg total) by mouth 2 (two) times daily with a meal. 60 tablet 4   Multiple Vitamin (MULTIVITAMIN) capsule Take 1 capsule by mouth daily.     olmesartan-hydrochlorothiazide (BENICAR HCT) 40-25 MG tablet Take 1 tablet by mouth daily. 30 tablet 5   potassium chloride (KLOR-CON) 10 MEQ tablet TAKE 1 TABLET(10 MEQ) BY MOUTH DAILY 90 tablet 2   No current facility-administered medications on file prior to visit.    Labs:     Latest Ref Rng & Units 05/16/2023    6:29 PM 05/16/2023    6:22 PM 04/25/2023   10:09 AM  CMP  Glucose 70 - 99 mg/dL 96  99  85   BUN 8 - 23 mg/dL 13  12  16    Creatinine 0.44 - 1.00 mg/dL 0.98  1.19  1.47   Sodium 135 - 145 mmol/L 140  137  143   Potassium 3.5 - 5.1 mmol/L 3.4  3.3  4.1   Chloride 98 - 111 mmol/L 102  102  103   CO2 22 - 32 mmol/L  27  25   Calcium 8.9 - 10.3 mg/dL  9.2  9.2   Total  Protein 6.0 - 8.5 g/dL   7.6   Total Bilirubin 0.0 - 1.2 mg/dL   0.2   Alkaline Phos 44 - 121 IU/L   139   AST 0 - 40 IU/L   18   ALT 0 - 32 IU/L   14    Lipid Panel     Component Value Date/Time   CHOL 192 04/25/2023 1009   TRIG 77 04/25/2023 1009   HDL 55 04/25/2023 1009   CHOLHDL 3.5 04/25/2023 1009   CHOLHDL 3.7 05/07/2019 0935   VLDL 11 07/13/2016 0903   LDLCALC 123 (H) 04/25/2023 1009   LDLCALC 120 (H) 05/07/2019 0935   LABVLDL 14 04/25/2023 1009   Lab Results  Component Value Date   HGBA1C 6.0 (H) 04/25/2023    Notable Signs/Symptoms: Non3  Lifestyle & Dietary Hx Lives with husband Eat at home and eats out  some  Doesn't cook a lot. Estimated daily fluid intake: 16 oz Supplements:  Sleep:  Stress / self-care:  Current average weekly physical activity: ADL  24-Hr Dietary Recall Eating 3 meals per day. Cooking more at home. Drinking  water Cut out fast foods.   Estimated Energy Needs Calories: 1200 Carbohydrate: 135g Protein: 90g Fat: 33g   NUTRITION DIAGNOSIS  NB-1.1 Food and nutrition-related knowledge deficit As related to excessive calorie intake .  As evidenced by BMI 43 and A1C 6%.   NUTRITION INTERVENTION  Nutrition education (E-1) on the following topics:  Nutrition and :Pre Diabetes education provided on My Plate, CHO counting, meal planning, portion sizes, timing of meals, avoiding snacks between meals unless having a low blood sugar, target ranges for A1C and blood sugars, signs/symptoms and treatment of hyper/hypoglycemia, monitoring blood sugars, taking medications as prescribed, benefits of exercising 30 minutes per day and prevention of complications of DM.  Lifestyle Medicine  - Whole Food, Plant Predominant Nutrition is highly recommended: Eat Plenty of vegetables, Mushrooms, fruits, Legumes, Whole Grains, Nuts, seeds in lieu of processed meats, processed snacks/pastries red meat, poultry, eggs.    -It is better to avoid simple carbohydrates including: Cakes, Sweet Desserts, Ice Cream, Soda (diet and regular), Sweet Tea, Candies, Chips, Cookies, Store Bought Juices, Alcohol in Excess of  1-2 drinks a day, Lemonade,  Artificial Sweeteners, Doughnuts, Coffee Creamers, "Sugar-free" Products, etc, etc.  This is not a complete list.....  Exercise: If you are able: 30 -60 minutes a day ,4 days a week, or 150 minutes a week.  The longer the better.  Combine stretch, strength, and aerobic activities.  If you were told in the past that you have high risk for cardiovascular diseases, you may seek evaluation by your heart doctor prior to initiating moderate to intense exercise programs.    Handouts Provided Include  Know your numbers Lifestyle Medicine handouts  Learning Style & Readiness for Change Teaching method utilized: Visual & Auditory  Demonstrated degree of understanding via: Teach Back   Barriers to learning/adherence to lifestyle change: none  Goals Established by Pt   Start working out with your daughter to PF 3 times per week for 30 minutes. Lose 2 lbs per months Drink 5 bottles of water per day   MONITORING & EVALUATION Dietary intake, weekly physical activity, and Weight in 3 month.  Next Steps  Patient is to work on meal planning and foods from garden and cut back on eating out.Marland Kitchen

## 2023-08-10 ENCOUNTER — Encounter: Payer: Self-pay | Admitting: Nutrition

## 2023-09-12 ENCOUNTER — Encounter: Payer: Self-pay | Admitting: Family Medicine

## 2023-09-12 ENCOUNTER — Ambulatory Visit (INDEPENDENT_AMBULATORY_CARE_PROVIDER_SITE_OTHER): Payer: BC Managed Care – PPO | Admitting: Family Medicine

## 2023-09-12 VITALS — BP 126/84 | HR 84 | Ht 61.0 in | Wt 216.1 lb

## 2023-09-12 DIAGNOSIS — R7303 Prediabetes: Secondary | ICD-10-CM | POA: Diagnosis not present

## 2023-09-12 DIAGNOSIS — I10 Essential (primary) hypertension: Secondary | ICD-10-CM | POA: Diagnosis not present

## 2023-09-12 DIAGNOSIS — Z13220 Encounter for screening for lipoid disorders: Secondary | ICD-10-CM | POA: Diagnosis not present

## 2023-09-12 DIAGNOSIS — Z0001 Encounter for general adult medical examination with abnormal findings: Secondary | ICD-10-CM | POA: Diagnosis not present

## 2023-09-12 NOTE — Progress Notes (Signed)
    Megan Morales     MRN: [id number]      DOB: 1956-02-05  Chief Complaint  Patient presents with   Annual Exam    HPI: Patient is in for annual physical exam. No other health concerns are expressed or addressed at the visit. Recent labs,  are reviewed. Immunization is reviewed , and  updated if needed.   PE: BP 126/84 (BP Location: Right Arm, Patient Position: Sitting, Cuff Size: Large)   Pulse 84   Ht 5\' 1"  (1.549 m)   Wt 216 lb 1.9 oz (98 kg)   SpO2 95%   BMI 40.84 kg/m   Pleasant  female, alert and oriented x 3, in no cardio-pulmonary distress. Afebrile. HEENT No facial trauma or asymetry. Sinuses non tender.  Extra occullar muscles intact.. External ears normal, . Neck: supple, no adenopathy,JVD or thyromegaly.No bruits.  Chest: Clear to ascultation bilaterally.No crackles or wheezes. Non tender to palpation  Cardiovascular system; Heart sounds normal,  S1 and  S2 ,no S3.  No murmur, or thrill. Apical beat not displaced Peripheral pulses normal.  Abdomen: Soft, non tender, no organomegaly or masses. No bruits. Bowel sounds normal. No guarding, tenderness or rebound.   .   Musculoskeletal exam: Full ROM of spine, hips , shoulders and knees. No deformity ,swelling or crepitus noted. No muscle wasting or atrophy.   Neurologic: Cranial nerves 2 to 12 intact. Power, tone ,sensation and reflexes normal throughout. No disturbance in gait. No tremor.  Skin: Intact, no ulceration, erythema , scaling or rash noted. Pigmentation normal throughout  Psych; Normal mood and affect. Judgement and concentration normal   Assessment & Plan:  Annual visit for general adult medical examination with abnormal findings Annual exam as documented. Counseling done  re healthy lifestyle involving commitment to 150 minutes exercise per week, heart healthy diet, and attaining healthy weight.The importance of adequate sleep also discussed. Regular seat belt use and  home safety, is also discussed. Changes in health habits are decided on by the patient with goals and time frames  set for achieving them. Immunization and cancer screening needs are specifically addressed at this visit.

## 2023-09-12 NOTE — Patient Instructions (Addendum)
F/u with pap in 3 months, call if you need me sooner  CONGRATS on excellent weight loss, with change in food choice, keep it up!  Increase exercise commitment to   Please check your blood pressure at least twice weekly, if consistently over 135/88 contact me , for dose increase  Labs already ordered today, I will send   a my chart message with result  Thanks for choosing [facility], we consider it a privelige to serve you.   BEST for 2025 and congrats on your new milestone!

## 2023-09-12 NOTE — Assessment & Plan Note (Signed)

## 2023-09-13 LAB — CMP14+EGFR
ALT: 18 [IU]/L (ref 0–32)
AST: 20 [IU]/L (ref 0–40)
Albumin: 4.2 g/dL (ref 3.9–4.9)
Alkaline Phosphatase: 125 [IU]/L — ABNORMAL HIGH (ref 44–121)
BUN/Creatinine Ratio: 24 (ref 12–28)
BUN: 22 mg/dL (ref 8–27)
Bilirubin Total: 0.2 mg/dL (ref 0.0–1.2)
CO2: 27 mmol/L (ref 20–29)
Calcium: 9.6 mg/dL (ref 8.7–10.3)
Chloride: 103 mmol/L (ref 96–106)
Creatinine, Ser: 0.93 mg/dL (ref 0.57–1.00)
Globulin, Total: 3.4 g/dL (ref 1.5–4.5)
Glucose: 82 mg/dL (ref 70–99)
Potassium: 4.3 mmol/L (ref 3.5–5.2)
Sodium: 143 mmol/L (ref 134–144)
Total Protein: 7.6 g/dL (ref 6.0–8.5)
eGFR: 68 mL/min/{1.73_m2} (ref >59)

## 2023-09-13 LAB — LIPID PANEL
Chol/HDL Ratio: 3.7 {ratio} (ref 0.0–4.4)
Cholesterol, Total: 191 mg/dL (ref 100–199)
HDL: 52 mg/dL (ref >39)
LDL Chol Calc (NIH): 127 mg/dL — ABNORMAL HIGH (ref 0–99)
Triglycerides: 63 mg/dL (ref 0–149)
VLDL Cholesterol Cal: 12 mg/dL (ref 5–40)

## 2023-09-13 LAB — HEMOGLOBIN A1C
Est. average glucose Bld gHb Est-mCnc: 123 mg/dL
Hgb A1c MFr Bld: 5.9 % — ABNORMAL HIGH (ref 4.8–5.6)

## 2023-09-27 DIAGNOSIS — R319 Hematuria, unspecified: Secondary | ICD-10-CM | POA: Diagnosis not present

## 2023-09-28 ENCOUNTER — Encounter: Payer: Self-pay | Admitting: Family Medicine

## 2023-10-05 NOTE — Progress Notes (Signed)
 Established Patient Office Visit   Subjective  Patient ID: Megan Morales, female    DOB: 03/14/1956  Age: 68 y.o. MRN: [id number]  Chief Complaint  Patient presents with   Acute Visit    UC follow up Back pain going down lft leg . Pt. States left thigh feels numb. Pain has been severe since 12/22    She  has a past medical history of Hypertension.  HPI Patient presents to the clinic for urgent care visit follow up on 09/27/23. Patient was seen previously for low back pain radiating down her right leg, consistent with sciatica, as well as urinary symptoms including frequency, hematuria, and nausea. At  this urgent care visit, she was prescribed Augmentin  for a suspected urinary tract infection and received a steroid injection for back pain relief.   Today patient reports persistent back pain that has been gradually worsening since its onset on 09/22/23. The pain is localized to the thoracic spine and is described as both aching and shooting, with radiation to the left thigh. The severity is rated as 10/10, and the pain is more pronounced during the day. Symptoms are aggravated by lying down, bending, certain positions, and standing. Associated symptoms include leg pain, numbness, tingling, and weakness. However, the patient denies bladder or bowel incontinence. Identified risk factors include obesity and a sedentary lifestyle. The patient has tried NSAIDs for symptom management, which have provided moderate relief.  Review of Systems  Constitutional:  Negative for chills and fever.  Respiratory:  Negative for shortness of breath.   Cardiovascular:  Negative for chest pain.  Musculoskeletal:  Positive for back pain, joint pain and myalgias.  Neurological:  Positive for tingling. Negative for dizziness and headaches.      Objective:     BP 136/85   Pulse 77   Ht 5' 1 (1.549 m)   Wt 219 lb 0.6 oz (99.4 kg)   SpO2 95%   BMI 41.39 kg/m  BP Readings from Last 3 Encounters:   10/06/23 136/85  09/12/23 126/84  07/13/23 (!) 160/100      Physical Exam Vitals reviewed.  Constitutional:      General: She is not in acute distress.    Appearance: Normal appearance. She is not ill-appearing, toxic-appearing or diaphoretic.  HENT:     Head: Normocephalic.  Eyes:     General:        Right eye: No discharge.        Left eye: No discharge.     Conjunctiva/sclera: Conjunctivae normal.  Cardiovascular:     Pulses: Normal pulses.     Heart sounds: Normal heart sounds.  Pulmonary:     Effort: Pulmonary effort is normal. No respiratory distress.     Breath sounds: Normal breath sounds.  Musculoskeletal:     Lumbar back: Spasms and tenderness present. Decreased range of motion. Positive right straight leg raise test and positive left straight leg raise test.  Skin:    General: Skin is warm and dry.     Capillary Refill: Capillary refill takes less than 2 seconds.  Neurological:     Mental Status: She is alert.  Psychiatric:        Mood and Affect: Mood normal.        Behavior: Behavior normal.      No results found for any visits on 10/06/23.  The 10-year ASCVD risk score (Arnett DK, et al., 2019) is: 10.7%    Assessment & Plan:  Lumbar pain Assessment &  Plan: Symptoms consistent with sciatic nerve pain Trial on Zanaflex  DepoMedrol 40 mg IM injection given today Xray ordered awaiting results will follow up We discussed the desired effects and potential side effects of the prescribed medication for back pain. Additionally, we reviewed non-pharmacological interventions, including the importance of rest, avoiding twisting, improper bending, and straining the lower back. I demonstrated proper body mechanics to prevent further injury and advised alternating between ice and heat therapy for relief. Stretching exercises for both the back and legs were recommended to improve flexibility and support recovery. The patient was advised to follow up if symptoms  worsen or persist. The patient expressed understanding of the treatment plan, and all questions were thoroughly addressed.   Orders: -     DG Lumbar Spine 2-3 Views; Future -     tiZANidine  HCl; Take 1 capsule (4 mg total) by mouth every 12 (twelve) hours as needed for muscle spasms.  Dispense: 60 capsule; Refill: 1 -     methylPREDNISolone  Acetate -     methylPREDNISolone  Acetate    Return if symptoms worsen or fail to improve.   Hilario Kidd Wilhelmena Falter, FNP

## 2023-10-05 NOTE — Patient Instructions (Signed)

## 2023-10-06 ENCOUNTER — Ambulatory Visit: Payer: BC Managed Care – PPO | Admitting: Family Medicine

## 2023-10-06 ENCOUNTER — Ambulatory Visit (HOSPITAL_COMMUNITY)
Admission: RE | Admit: 2023-10-06 | Discharge: 2023-10-06 | Disposition: A | Discharge status: Home / Self Care | Payer: BC Managed Care – PPO | Source: Ambulatory Visit | Attending: Family Medicine

## 2023-10-06 ENCOUNTER — Encounter: Payer: Self-pay | Admitting: Family Medicine

## 2023-10-06 VITALS — BP 136/85 | HR 77 | Ht 61.0 in | Wt 219.0 lb

## 2023-10-06 DIAGNOSIS — M545 Low back pain, unspecified: Secondary | ICD-10-CM | POA: Insufficient documentation

## 2023-10-06 DIAGNOSIS — M48061 Spinal stenosis, lumbar region without neurogenic claudication: Secondary | ICD-10-CM | POA: Diagnosis not present

## 2023-10-06 DIAGNOSIS — M47816 Spondylosis without myelopathy or radiculopathy, lumbar region: Secondary | ICD-10-CM | POA: Diagnosis not present

## 2023-10-06 MED ORDER — METHYLPREDNISOLONE ACETATE 40 MG/ML IJ SUSP: 40.0000 mg | Freq: Once | INTRAMUSCULAR | Status: DC

## 2023-10-06 MED ORDER — TIZANIDINE HCL 4 MG PO CAPS
4.0000 mg | ORAL_CAPSULE | Freq: Two times a day (BID) | ORAL | 1 refills | Status: DC | PRN
Start: 2023-10-06 — End: 2024-04-16

## 2023-10-06 MED ORDER — METHYLPREDNISOLONE ACETATE 80 MG/ML IJ SUSP
40.0000 mg | Freq: Once | INTRAMUSCULAR | Status: AC
Start: 2023-10-06 — End: 2023-10-06
  Administered 2023-10-06: 40 mg via INTRAMUSCULAR

## 2023-10-06 NOTE — Assessment & Plan Note (Signed)
 Symptoms consistent with sciatic nerve pain Trial on Zanaflex  DepoMedrol 40 mg IM injection given today Xray ordered awaiting results will follow up We discussed the desired effects and potential side effects of the prescribed medication for back pain. Additionally, we reviewed non-pharmacological interventions, including the importance of rest, avoiding twisting, improper bending, and straining the lower back. I demonstrated proper body mechanics to prevent further injury and advised alternating between ice and heat therapy for relief. Stretching exercises for both the back and legs were recommended to improve flexibility and support recovery. The patient was advised to follow up if symptoms worsen or persist. The patient expressed understanding of the treatment plan, and all questions were thoroughly addressed.

## 2023-10-16 ENCOUNTER — Telehealth: Payer: Self-pay

## 2023-10-16 NOTE — Telephone Encounter (Signed)
 Copied from CRM [id number]. Topic: Clinical - Lab/Test Results >> Oct 13, 2023  9:48 AM Powell HERO wrote: Reason for CRM: Patient called to check on her results of her imaging, I did not see where results were available but she would like to be called if and when they are available. She states she is still in pain.

## 2023-10-19 NOTE — Telephone Encounter (Signed)
Patient is schedule for f/u of this issue. Stated she has numbness and weakness on her L leg.

## 2023-11-10 ENCOUNTER — Ambulatory Visit: Payer: BC Managed Care – PPO | Admitting: Family Medicine

## 2023-11-29 ENCOUNTER — Other Ambulatory Visit: Payer: Self-pay | Admitting: Family Medicine

## 2023-11-29 DIAGNOSIS — E876 Hypokalemia: Secondary | ICD-10-CM

## 2023-12-12 ENCOUNTER — Other Ambulatory Visit (HOSPITAL_COMMUNITY)
Admission: RE | Admit: 2023-12-12 | Discharge: 2023-12-12 | Disposition: A | Discharge status: Home / Self Care | Source: Ambulatory Visit | Attending: Family Medicine | Admitting: Family Medicine

## 2023-12-12 ENCOUNTER — Ambulatory Visit: Payer: BC Managed Care – PPO | Admitting: Family Medicine

## 2023-12-12 ENCOUNTER — Encounter: Payer: Self-pay | Admitting: Family Medicine

## 2023-12-12 VITALS — BP 137/87 | HR 86 | Ht 61.0 in | Wt 216.1 lb

## 2023-12-12 DIAGNOSIS — E041 Nontoxic single thyroid nodule: Secondary | ICD-10-CM | POA: Diagnosis not present

## 2023-12-12 DIAGNOSIS — E559 Vitamin D deficiency, unspecified: Secondary | ICD-10-CM

## 2023-12-12 DIAGNOSIS — E785 Hyperlipidemia, unspecified: Secondary | ICD-10-CM

## 2023-12-12 DIAGNOSIS — Z124 Encounter for screening for malignant neoplasm of cervix: Secondary | ICD-10-CM | POA: Insufficient documentation

## 2023-12-12 DIAGNOSIS — I10 Essential (primary) hypertension: Secondary | ICD-10-CM

## 2023-12-12 DIAGNOSIS — Z1212 Encounter for screening for malignant neoplasm of rectum: Secondary | ICD-10-CM

## 2023-12-12 DIAGNOSIS — Z1211 Encounter for screening for malignant neoplasm of colon: Secondary | ICD-10-CM | POA: Insufficient documentation

## 2023-12-12 DIAGNOSIS — R7303 Prediabetes: Secondary | ICD-10-CM

## 2023-12-12 DIAGNOSIS — Z1231 Encounter for screening mammogram for malignant neoplasm of breast: Secondary | ICD-10-CM

## 2023-12-12 NOTE — Assessment & Plan Note (Signed)
 Updated lab needed.

## 2023-12-12 NOTE — Assessment & Plan Note (Signed)
 Hyperlipidemia:Low fat diet discussed and encouraged.   Lipid Panel  Lab Results  Component Value Date   CHOL 191 09/12/2023   HDL 52 09/12/2023   LDLCALC 127 (H) 09/12/2023   TRIG 63 09/12/2023   CHOLHDL 3.7 09/12/2023     Updated lab needed at/ before next visit.

## 2023-12-12 NOTE — Assessment & Plan Note (Signed)
 Patient educated about the importance of limiting  Carbohydrate intake , the need to commit to daily physical activity for a minimum of 30 minutes , and to commit weight loss. The fact that changes in all these areas will reduce or eliminate all together the development of diabetes is stressed.      Latest Ref Rng & Units 09/12/2023   10:28 AM 05/16/2023    6:29 PM 05/16/2023    6:22 PM 04/25/2023   10:09 AM 06/22/2022   12:50 PM  Diabetic Labs  HbA1c 4.8 - 5.6 % 5.9    6.0  5.9   Chol 100 - 199 mg/dL 161    096  045   HDL >40 mg/dL 52    55  49   Calc LDL 0 - 99 mg/dL 981    191  478   Triglycerides 0 - 149 mg/dL 63    77  78   Creatinine 0.57 - 1.00 mg/dL 2.95  6.21  3.08  6.57  0.96       12/12/2023    9:34 AM 10/06/2023    9:09 AM 09/12/2023    9:48 AM 08/09/2023    2:58 PM 07/13/2023    9:11 AM 07/13/2023    8:51 AM 07/13/2023    8:49 AM  BP/Weight  Systolic BP 137 136 126  160 149 165  Diastolic BP 87 85 84  100 87 95  Wt. (Lbs) 216.08 219.04 216.12 223.6   222.12  BMI 40.83 kg/m2 41.39 kg/m2 40.84 kg/m2 42.25 kg/m2   41.97 kg/m2       No data to display          Updated lab needed at/ before next visit.

## 2023-12-12 NOTE — Patient Instructions (Addendum)
 Follow-up in 4 months, call if you need me sooner.  Labs will be drawn today.  Fasting labs are to be drawn 3 to 5 days before your follow up, order also given today, hold amnd get that bloodwork later on  Additional labs will be drawn 3 to 5 days before your next visit these are given to you and those will be fasting labs.  No changes in medication.  You are referred for colonoscopy which is due in November.  Please schedule mammogram at the time of checkout.  You are referred to ENT because the cyst in your neck is enlarging.

## 2023-12-12 NOTE — Assessment & Plan Note (Signed)
 Increaed in size check tsh and refer back to eNT

## 2023-12-12 NOTE — Assessment & Plan Note (Signed)
 Controlled, no change in medication DASH diet and commitment to daily physical activity for a minimum of 30 minutes discussed and encouraged, as a part of hypertension management. The importance of attaining a healthy weight is also discussed.     12/12/2023    9:34 AM 10/06/2023    9:09 AM 09/12/2023    9:48 AM 08/09/2023    2:58 PM 07/13/2023    9:11 AM 07/13/2023    8:51 AM 07/13/2023    8:49 AM  BP/Weight  Systolic BP 137 136 126  160 149 165  Diastolic BP 87 85 84  100 87 95  Wt. (Lbs) 216.08 219.04 216.12 223.6   222.12  BMI 40.83 kg/m2 41.39 kg/m2 40.84 kg/m2 42.25 kg/m2   41.97 kg/m2

## 2023-12-14 ENCOUNTER — Encounter: Payer: Self-pay | Admitting: Family Medicine

## 2023-12-14 LAB — BMP8+EGFR
BUN/Creatinine Ratio: 20 (ref 12–28)
BUN: 16 mg/dL (ref 8–27)
CO2: 23 mmol/L (ref 20–29)
Calcium: 9.2 mg/dL (ref 8.7–10.3)
Chloride: 99 mmol/L (ref 96–106)
Creatinine, Ser: 0.82 mg/dL (ref 0.57–1.00)
Glucose: 76 mg/dL (ref 70–99)
Potassium: 4.2 mmol/L (ref 3.5–5.2)
Sodium: 139 mmol/L (ref 134–144)
eGFR: 79 mL/min/{1.73_m2} (ref >59)

## 2023-12-14 LAB — CYTOLOGY - PAP
Adequacy: ABSENT
Diagnosis: NEGATIVE

## 2023-12-14 LAB — TSH+FREE T4
Free T4: 1.15 ng/dL (ref 0.82–1.77)
TSH: 0.582 u[IU]/mL (ref 0.450–4.500)

## 2023-12-14 LAB — VITAMIN D 25 HYDROXY (VIT D DEFICIENCY, FRACTURES): Vit D, 25-Hydroxy: 24.3 ng/mL — ABNORMAL LOW (ref 30.0–100.0)

## 2023-12-14 MED ORDER — VITAMIN D (ERGOCALCIFEROL) 1.25 MG (50000 UNIT) PO CAPS: 50000.0000 [IU] | ORAL_CAPSULE | ORAL | 2 refills | Status: AC

## 2023-12-17 DIAGNOSIS — Z124 Encounter for screening for malignant neoplasm of cervix: Secondary | ICD-10-CM | POA: Insufficient documentation

## 2023-12-17 NOTE — Assessment & Plan Note (Signed)
 Due in Fall 2025, referral entered

## 2023-12-17 NOTE — Assessment & Plan Note (Signed)
Specimen sent.

## 2023-12-17 NOTE — Progress Notes (Signed)
 [facility] REC[facility]     MRN: [id number]      DOB: Apr 03, 1956  Chief Complaint  Patient presents with   Hypertension    Follow up    HPI [name] is here for follow up and re-evaluation of chronic medical conditions, medication management and review of any available recent lab and radiology data.  Preventive health is updated, specifically  Cancer screening , needs pap, and is referred for colonoscopy due in the Fall   Questions or concerns regarding consultations or procedures which the PT has had in the interim are  addressed. The PT denies any adverse reactions to current medications since the last visit.  There are no new concerns.  There are no specific complaints   ROS Denies recent fever or chills. Denies sinus pressure, nasal congestion, ear pain or sore throat. Denies chest congestion, productive cough or wheezing. Denies chest pains, palpitations and leg swelling Denies abdominal pain, nausea, vomiting,diarrhea or constipation.   Denies dysuria, frequency, hesitancy or incontinence. Chronic  joint pain, swelling and some  limitation in mobility. Denies headaches, seizures, numbness, or tingling. Denies depression, anxiety or insomnia. Denies skin break down or rash.   PE  BP 137/87   Pulse 86   Ht 5\' 1"  (1.549 m)   Wt 216 lb 1.3 oz (98 kg)   SpO2 94%   BMI 40.83 kg/m   Patient alert and oriented and in no cardiopulmonary distress.  HEENT: No facial asymmetry, EOMI,     Neck supple .THyromegaly  Chest: Clear to auscultation bilaterally.  CVS: S1, S2 no murmurs, no S3.Regular rate.  ABD: Soft non tender.   Pelvic: Ext genitalia normal, no ulcers, no adenopathy Internal: vaginal walls moist, no ulcers, no cervical motion or adnexal tenderness, physiologic d/c , no adnexal masses, uterus not enlarged, cervix appear healthy  Ext: No edema  MS: Adequate ROM spine, shoulders, hips and  reduced in knees.  Skin: Intact, no ulcerations or rash noted.  Psych:  Good eye contact, normal affect. Memory intact not anxious or depressed appearing.  CNS: CN 2-12 intact, power,  normal throughout.no focal deficits noted.   Assessment & Plan Vitamin D deficiency Updated lab needed   Thyroid cyst Increaed in size check tsh and refer back to eNT  Prediabetes Patient educated about the importance of limiting  Carbohydrate intake , the need to commit to daily physical activity for a minimum of 30 minutes , and to commit weight loss. The fact that changes in all these areas will reduce or eliminate all together the development of diabetes is stressed.      Latest Ref Rng & Units 09/12/2023   10:28 AM 05/16/2023    6:29 PM 05/16/2023    6:22 PM 04/25/2023   10:09 AM 06/22/2022   12:50 PM  Diabetic Labs  HbA1c 4.8 - 5.6 % 5.9    6.0  5.9   Chol 100 - 199 mg/dL 086    578  469   HDL >62 mg/dL 52    55  49   Calc LDL 0 - 99 mg/dL 952    841  324   Triglycerides 0 - 149 mg/dL 63    77  78   Creatinine 0.57 - 1.00 mg/dL 4.01  0.27  2.53  6.64  0.96       12/12/2023    9:34 AM 10/06/2023    9:09 AM 09/12/2023    9:48 AM 08/09/2023    2:58 PM 07/13/2023  9:11 AM 07/13/2023    8:51 AM 07/13/2023    8:49 AM  BP/Weight  Systolic BP 137 136 126  160 149 165  Diastolic BP 87 85 84  100 87 95  Wt. (Lbs) 216.08 219.04 216.12 223.6   222.12  BMI 40.83 kg/m2 41.39 kg/m2 40.84 kg/m2 42.25 kg/m2   41.97 kg/m2       No data to display          Updated lab needed at/ before next visit.   Essential hypertension, benign Controlled, no change in medication DASH diet and commitment to daily physical activity for a minimum of 30 minutes discussed and encouraged, as a part of hypertension management. The importance of attaining a healthy weight is also discussed.     12/12/2023    9:34 AM 10/06/2023    9:09 AM 09/12/2023    9:48 AM 08/09/2023    2:58 PM 07/13/2023    9:11 AM 07/13/2023    8:51 AM 07/13/2023    8:49 AM  BP/Weight  Systolic BP 137 136  126  160 161 165  Diastolic BP 87 85 84  100 87 95  Wt. (Lbs) 216.08 219.04 216.12 223.6   222.12  BMI 40.83 kg/m2 41.39 kg/m2 40.84 kg/m2 42.25 kg/m2   41.97 kg/m2       Dyslipidemia Hyperlipidemia:Low fat diet discussed and encouraged.   Lipid Panel  Lab Results  Component Value Date   CHOL 191 09/12/2023   HDL 52 09/12/2023   LDLCALC 127 (H) 09/12/2023   TRIG 63 09/12/2023   CHOLHDL 3.7 09/12/2023     Updated lab needed at/ before next visit.   Morbid obesity (HCC)  Patient re-educated about  the importance of commitment to a  minimum of 150 minutes of exercise per week as able.  The importance of healthy food choices with portion control discussed, as well as eating regularly and within a 12 hour window most days. The need to choose "clean , green" food 50 to 75% of the time is discussed, as well as to make water the primary drink and set a goal of 64 ounces water daily.       12/12/2023    9:34 AM 10/06/2023    9:09 AM 09/12/2023    9:48 AM  Weight /BMI  Weight 216 lb 1.3 oz 219 lb 0.6 oz 216 lb 1.9 oz  Height 5\' 1"  (1.549 m) 5\' 1"  (1.549 m) 5\' 1"  (1.549 m)  BMI 40.83 kg/m2 41.39 kg/m2 40.84 kg/m2      Pap smear for cervical cancer screening Specimen sent  Screening for colorectal cancer Due in Fall 2025, referral entered

## 2023-12-17 NOTE — Assessment & Plan Note (Signed)
  Patient re-educated about  the importance of commitment to a  minimum of 150 minutes of exercise per week as able.  The importance of healthy food choices with portion control discussed, as well as eating regularly and within a 12 hour window most days. The need to choose "clean , green" food 50 to 75% of the time is discussed, as well as to make water the primary drink and set a goal of 64 ounces water daily.       12/12/2023    9:34 AM 10/06/2023    9:09 AM 09/12/2023    9:48 AM  Weight /BMI  Weight 216 lb 1.3 oz 219 lb 0.6 oz 216 lb 1.9 oz  Height 5\' 1"  (1.549 m) 5\' 1"  (1.549 m) 5\' 1"  (1.549 m)  BMI 40.83 kg/m2 41.39 kg/m2 40.84 kg/m2

## 2024-01-17 ENCOUNTER — Ambulatory Visit: Payer: BC Managed Care – PPO | Admitting: Nutrition

## 2024-02-05 ENCOUNTER — Ambulatory Visit (HOSPITAL_COMMUNITY)
Admission: RE | Admit: 2024-02-05 | Discharge: 2024-02-05 | Disposition: A | Discharge status: Home / Self Care | Source: Ambulatory Visit | Attending: Family Medicine | Admitting: Family Medicine

## 2024-02-05 DIAGNOSIS — Z1231 Encounter for screening mammogram for malignant neoplasm of breast: Secondary | ICD-10-CM | POA: Diagnosis not present

## 2024-02-07 ENCOUNTER — Other Ambulatory Visit (HOSPITAL_COMMUNITY): Payer: Self-pay | Admitting: Family Medicine

## 2024-02-07 DIAGNOSIS — R928 Other abnormal and inconclusive findings on diagnostic imaging of breast: Secondary | ICD-10-CM

## 2024-02-27 ENCOUNTER — Encounter (HOSPITAL_COMMUNITY)

## 2024-02-27 ENCOUNTER — Ambulatory Visit
Admission: RE | Admit: 2024-02-27 | Discharge: 2024-02-27 | Disposition: A | Discharge status: Home / Self Care | Source: Ambulatory Visit | Attending: Family Medicine | Admitting: Family Medicine

## 2024-02-27 ENCOUNTER — Ambulatory Visit (HOSPITAL_COMMUNITY)

## 2024-02-27 ENCOUNTER — Encounter (HOSPITAL_COMMUNITY): Payer: Self-pay

## 2024-02-27 DIAGNOSIS — R928 Other abnormal and inconclusive findings on diagnostic imaging of breast: Secondary | ICD-10-CM

## 2024-02-27 DIAGNOSIS — N6002 Solitary cyst of left breast: Secondary | ICD-10-CM | POA: Diagnosis not present

## 2024-02-27 DIAGNOSIS — N6325 Unspecified lump in the left breast, overlapping quadrants: Secondary | ICD-10-CM | POA: Diagnosis not present

## 2024-02-28 ENCOUNTER — Encounter (INDEPENDENT_AMBULATORY_CARE_PROVIDER_SITE_OTHER): Payer: Self-pay | Admitting: Otolaryngology

## 2024-02-28 ENCOUNTER — Ambulatory Visit (INDEPENDENT_AMBULATORY_CARE_PROVIDER_SITE_OTHER): Admitting: Otolaryngology

## 2024-02-28 VITALS — BP 141/85 | HR 94 | Ht 61.0 in | Wt 220.0 lb

## 2024-02-28 DIAGNOSIS — E041 Nontoxic single thyroid nodule: Secondary | ICD-10-CM | POA: Diagnosis not present

## 2024-02-28 NOTE — Patient Instructions (Signed)
 I have ordered an imaging study for you to complete prior to your next visit. Please call [facility] Scheduling at [phone] to schedule your imaging if you have not received a call within 24 hours. If you are unable to complete your imaging study prior to your next scheduled visit please call our office to let us know.

## 2024-02-28 NOTE — Progress Notes (Signed)
 Dear Dr. Rodolph Clap, Here is my assessment for our mutual patient, Megan Morales. Thank you for allowing me the opportunity to care for your patient. Please do not hesitate to contact me should you have any other questions. Sincerely, Dr. Milon Aloe  Otolaryngology Clinic Note Referring provider: Dr. Rodolph Clap HPI:  Megan Morales is a 68 y.o. female kindly referred by Dr. Rodolph Clap for evaluation of thyroid  nodule  Initial visit (02/2024): Goiter: noted swelling to left side of neck in fall of 2024, went to UC and then ED for evaluation where she was noted to have left thyroid  cyst. Saw Dr. Ralston Burkes who rec FNA/aspiration but did not go through with it Notes waxes and wanes in size. No pain. No other neck masses Compressive symptoms: denies trouble breathing, or speech. Rare intermittent dysphagia/compression when she lays down Hypo or hyperthyroid symptoms: denies Tobacco: no  Prior evaluation has included: labs, US , CT  History of radiation to H&N: no Family history of thyroid  cancer: no  H&N Surgery: no Personal or FHx of bleeding dz or anesthesia difficulty: no  GLP-1: no AP/AC: no  Tobacco: no  PMHx: PreDM, HLD, HTN  Independent Review of Additional Tests or Records:  Dr. Ralston Burkes (ENT): left thyroid  cyst, no biopsy; noted neck swelling, no sx; Dx: Thyroid  cyst; Rx: FNA TSH and FT4 (12/12/2023): TSH 0.582; FT4: 1.15 Dr. Rodolph Clap notes (12/12/2023): noted thyroid  cyst, increased in size; Rx: ref to ENT CT Neck 05/16/2023: independently interpreted - large left thyroid  cyst with mild deviation of airway left to right; right sided nodule as well; no worrisome lymphadenopathy noted - small scattered b/l level 2 nodes  US  Thyroid  05/2023 independently interpreted: agree with read  PMH/Meds/All/SocHx/FamHx/ROS:   Past Medical History:  Diagnosis Date   Hypertension      Past Surgical History:  Procedure Laterality Date   BREAST BIOPSY Right 03/26/2018   x2   BREAST BIOPSY Right  10/10/2018   KNEE ARTHROSCOPY WITH MEDIAL MENISECTOMY Left 04/30/2020   Procedure: KNEE ARTHROSCOPY WITH MEDIAL MENISECTOMY;  Surgeon: Darrin Emerald, MD;  Location: [facility];  Service: Orthopedics;  Laterality: Left;   MYOMECTOMY  2010   TUBAL LIGATION  1992    Family History  Problem Relation Age of Onset   Breast cancer Mother 74   Diabetes Father    Stroke Father    Hypertension Father    Cancer Brother        liver   Hypertension Brother    Colon cancer Neg Hx    Esophageal cancer Neg Hx    Pancreatic cancer Neg Hx    Rectal cancer Neg Hx    Stomach cancer Neg Hx      Social Connections: Not on file      Current Outpatient Medications:    amLODipine  (NORVASC ) 10 MG tablet, Take 1 tablet (10 mg total) by mouth daily., Disp: 90 tablet, Rfl: 3   Calcium Carbonate-Vitamin D  600-400 MG-UNIT tablet, Take 1 tablet by mouth daily., Disp: , Rfl:    carvedilol  (COREG ) 3.125 MG tablet, Take 1 tablet (3.125 mg total) by mouth 2 (two) times daily with a meal., Disp: 60 tablet, Rfl: 3   metFORMIN  (GLUCOPHAGE ) 500 MG tablet, Take 1 tablet (500 mg total) by mouth 2 (two) times daily with a meal., Disp: 60 tablet, Rfl: 4   Multiple Vitamin (MULTIVITAMIN) capsule, Take 1 capsule by mouth daily., Disp: , Rfl:    olmesartan -hydrochlorothiazide  (BENICAR  HCT) 40-25 MG tablet, Take 1 tablet by mouth daily., Disp: 30  tablet, Rfl: 5   potassium chloride  (KLOR-CON ) 10 MEQ tablet, TAKE 1 TABLET(10 MEQ) BY MOUTH DAILY, Disp: 90 tablet, Rfl: 2   tiZANidine  (ZANAFLEX ) 4 MG capsule, Take 1 capsule (4 mg total) by mouth every 12 (twelve) hours as needed for muscle spasms., Disp: 60 capsule, Rfl: 1   Vitamin D , Ergocalciferol , (DRISDOL ) 1.25 MG (50000 UNIT) CAPS capsule, Take 1 capsule (50,000 Units total) by mouth every 7 (seven) days., Disp: 12 capsule, Rfl: 2  Current Facility-Administered Medications:    methylPREDNISolone  acetate (DEPO-MEDROL ) injection 40 mg, 40 mg, Intramuscular, Once,     Physical Exam:   BP (!) 141/85 (BP Location: Left Arm, Patient Position: Sitting, Cuff Size: Large)   Pulse 94   Ht 5\' 1"  (1.549 m)   Wt 220 lb (99.8 kg)   SpO2 95%   BMI 41.57 kg/m   Salient findings:  CN II-XII intact  Bilateral EAC clear and TM intact with well pneumatized middle ear spaces Anterior rhinoscopy: Septum intact; bilateral inferior turbinates without significant hypertrophy No lesions of oral cavity/oropharynx No obviously palpable neck masses/lymphadenopathy/thyromegaly EXCEPT: soft left neck thyromegaly consistent with prior noted thyroid  cyst, does not appear to extend inferior to clavicle No respiratory distress or stridor; easily lays flat; voice quality class 1.5 TFL was indicated to better evaluate the proximal airway, given the patient's history and exam findings, and is detailed below.   Seprately Identifiable Procedures:  Prior to initiating any procedures, risks/benefits/alternatives were explained to the patient and verbal consent obtained. Procedure Note Pre-procedure diagnosis: Thyroid  nodule, mass effect on airway, rule out airway compromise Post-procedure diagnosis: Same Procedure: Transnasal Fiberoptic Laryngoscopy, CPT 31575 - Mod 25 Indication: see above Complications: None apparent EBL: 0 mL  The procedure was undertaken to further evaluate the patient's complaint above, with mirror exam inadequate for appropriate examination due to gag reflex and poor patient tolerance  Procedure:  Patient was identified as correct patient. Verbal consent was obtained. The nose was sprayed with oxymetazoline and 4% lidocaine . The The flexible laryngoscope was passed through the nose to view the nasal cavity, pharynx (oropharynx, hypopharynx) and larynx.  The larynx was examined at rest and during multiple phonatory tasks. Documentation was obtained and reviewed with patient. The scope was removed. The patient tolerated the procedure well.  Findings: The nasal  cavity and nasopharynx did not reveal any masses or lesions, mucosa appeared to be without obvious lesions. The tongue base, pharyngeal walls, piriform sinuses, vallecula, epiglottis and postcricoid region are normal in appearance EXCEPT: effacement of right pyriform and AE fold likely from left thyroid  lesion. The visualized portion of the subglottis and proximal trachea is widely patent. The vocal folds are mobile bilaterally. There are no lesions on the free edge of the vocal folds nor elsewhere in the larynx worrisome for malignancy.      Electronically signed by: Evelina Hippo, MD 02/28/2024 9:25 AM   Impression & Plans:  Megan Morales is a 68 y.o. female with:  1. Thyroid  nodule   2. Thyroid  cyst    Noted multinodular goiter since at least fall 2024. Intermittent/rare compressive sx with some mass effect. No prior FNA. Appears to be relatively simple cyst on US  without worrisome features. We discussed options including observation, FNA, v/s lobectomy Will proceed with FNA for that nodule and the TR 4 nodule on right F/u in 6 months for recheck, will f/u bx results, certainly sooner if any other concerns or compressive sx develop; she was informed of possible recurrence of this  after aspiration  See below regarding exact medications prescribed this encounter including dosages and route: No orders of the defined types were placed in this encounter.     Thank you for allowing me the opportunity to care for your patient. Please do not hesitate to contact me should you have any other questions.  Sincerely, Milon Aloe, MD Otolaryngologist (ENT), [facility] Phone: [phone] Fax: [phone]  02/28/2024, 9:25 AM   I have personally spent 45 minutes involved in face-to-face and non-face-to-face activities for this patient on the day of the visit.  Professional time spent excludes any procedures performed but includes the following activities, in addition to those  noted in the documentation: preparing to see the patient (review of outside documentation and results), performing a medically appropriate examination, counseling, documenting in the electronic health record, independently interpreting results (CT and US ).

## 2024-04-16 ENCOUNTER — Ambulatory Visit: Admitting: Family Medicine

## 2024-04-16 ENCOUNTER — Encounter: Payer: Self-pay | Admitting: Family Medicine

## 2024-04-16 VITALS — BP 123/81 | HR 77 | Resp 16 | Ht 61.0 in | Wt 221.0 lb

## 2024-04-16 DIAGNOSIS — E785 Hyperlipidemia, unspecified: Secondary | ICD-10-CM | POA: Diagnosis not present

## 2024-04-16 DIAGNOSIS — R7303 Prediabetes: Secondary | ICD-10-CM | POA: Diagnosis not present

## 2024-04-16 DIAGNOSIS — I10 Essential (primary) hypertension: Secondary | ICD-10-CM | POA: Diagnosis not present

## 2024-04-16 DIAGNOSIS — E876 Hypokalemia: Secondary | ICD-10-CM | POA: Diagnosis not present

## 2024-04-16 DIAGNOSIS — E8881 Metabolic syndrome: Secondary | ICD-10-CM

## 2024-04-16 DIAGNOSIS — E559 Vitamin D deficiency, unspecified: Secondary | ICD-10-CM

## 2024-04-16 DIAGNOSIS — Z1211 Encounter for screening for malignant neoplasm of colon: Secondary | ICD-10-CM

## 2024-04-16 MED ORDER — AMLODIPINE BESYLATE 10 MG PO TABS: 10.0000 mg | ORAL_TABLET | Freq: Every day | ORAL | 3 refills | Status: AC

## 2024-04-16 MED ORDER — OLMESARTAN MEDOXOMIL-HCTZ 40-25 MG PO TABS: 1.0000 | ORAL_TABLET | Freq: Every day | ORAL | 3 refills | Status: AC

## 2024-04-16 MED ORDER — METFORMIN HCL 500 MG PO TABS: 500.0000 mg | ORAL_TABLET | Freq: Two times a day (BID) | ORAL | 3 refills | Status: AC

## 2024-04-16 MED ORDER — POTASSIUM CHLORIDE ER 10 MEQ PO TBCR: EXTENDED_RELEASE_TABLET | ORAL | 3 refills | Status: DC

## 2024-04-16 MED ORDER — CARVEDILOL 3.125 MG PO TABS: 3.1250 mg | ORAL_TABLET | Freq: Two times a day (BID) | ORAL | 3 refills | Status: DC

## 2024-04-16 NOTE — Patient Instructions (Signed)
 Annual exam 12/12 or after  Work on food choice for improved health  It is important that you exercise regularly at least 30 minutes 5 times a week. If you develop chest pain, have severe difficulty breathing, or feel very tired, stop exercising immediately and seek medical attention   You are referred for colonscopy  Pls call eNT  to arrange draining the cyst as was discussed  Thanks for choosing [facility], we consider it a privelige to serve you.

## 2024-04-17 ENCOUNTER — Ambulatory Visit: Payer: Self-pay | Admitting: Family Medicine

## 2024-04-17 LAB — CMP14+EGFR
ALT: 11 IU/L (ref 0–32)
AST: 13 IU/L (ref 0–40)
Albumin: 4.3 g/dL (ref 3.9–4.9)
Alkaline Phosphatase: 136 IU/L — ABNORMAL HIGH (ref 44–121)
BUN/Creatinine Ratio: 16 (ref 12–28)
BUN: 16 mg/dL (ref 8–27)
Bilirubin Total: 0.4 mg/dL (ref 0.0–1.2)
CO2: 23 mmol/L (ref 20–29)
Calcium: 9.5 mg/dL (ref 8.7–10.3)
Chloride: 99 mmol/L (ref 96–106)
Creatinine, Ser: 1.03 mg/dL — ABNORMAL HIGH (ref 0.57–1.00)
Globulin, Total: 3.2 g/dL (ref 1.5–4.5)
Glucose: 82 mg/dL (ref 70–99)
Potassium: 4.2 mmol/L (ref 3.5–5.2)
Sodium: 139 mmol/L (ref 134–144)
Total Protein: 7.5 g/dL (ref 6.0–8.5)
eGFR: 60 mL/min/1.73 (ref >59)

## 2024-04-17 LAB — CBC WITH DIFFERENTIAL/PLATELET
Basophils Absolute: 0.1 x10E3/uL (ref 0.0–0.2)
Basos: 1 %
EOS (ABSOLUTE): 0.2 x10E3/uL (ref 0.0–0.4)
Eos: 3 %
Hematocrit: 39.6 % (ref 34.0–46.6)
Hemoglobin: 13 g/dL (ref 11.1–15.9)
Immature Grans (Abs): 0 x10E3/uL (ref 0.0–0.1)
Immature Granulocytes: 0 %
Lymphocytes Absolute: 2 x10E3/uL (ref 0.7–3.1)
Lymphs: 35 %
MCH: 31 pg (ref 26.6–33.0)
MCHC: 32.8 g/dL (ref 31.5–35.7)
MCV: 95 fL (ref 79–97)
Monocytes Absolute: 0.4 x10E3/uL (ref 0.1–0.9)
Monocytes: 7 %
Neutrophils Absolute: 3.1 x10E3/uL (ref 1.4–7.0)
Neutrophils: 54 %
Platelets: 204 x10E3/uL (ref 150–450)
RBC: 4.19 x10E6/uL (ref 3.77–5.28)
RDW: 13.4 % (ref 11.7–15.4)
WBC: 5.8 x10E3/uL (ref 3.4–10.8)

## 2024-04-17 LAB — HEMOGLOBIN A1C
Est. average glucose Bld gHb Est-mCnc: 117 mg/dL
Hgb A1c MFr Bld: 5.7 % — ABNORMAL HIGH (ref 4.8–5.6)

## 2024-04-17 LAB — LIPID PANEL W/O CHOL/HDL RATIO
Cholesterol, Total: 194 mg/dL (ref 100–199)
HDL: 50 mg/dL (ref >39)
LDL Chol Calc (NIH): 131 mg/dL — ABNORMAL HIGH (ref 0–99)
Triglycerides: 72 mg/dL (ref 0–149)
VLDL Cholesterol Cal: 13 mg/dL (ref 5–40)

## 2024-05-05 DIAGNOSIS — E876 Hypokalemia: Secondary | ICD-10-CM | POA: Insufficient documentation

## 2024-05-05 DIAGNOSIS — Z1211 Encounter for screening for malignant neoplasm of colon: Secondary | ICD-10-CM | POA: Insufficient documentation

## 2024-05-05 NOTE — Assessment & Plan Note (Signed)
 Controlled, no change in medication DASH diet and commitment to daily physical activity for a minimum of 30 minutes discussed and encouraged, as a part of hypertension management. The importance of attaining a healthy weight is also discussed.     04/16/2024    9:40 AM 02/28/2024    8:58 AM 12/12/2023    9:34 AM 10/06/2023    9:09 AM 09/12/2023    9:48 AM 08/09/2023    2:58 PM 07/13/2023    9:11 AM  BP/Weight  Systolic BP 123 141 137 136 126  160  Diastolic BP 81 85 87 85 84  100  Wt. (Lbs) 221.04 220 216.08 219.04 216.12 223.6   BMI 41.77 kg/m2 41.57 kg/m2 40.83 kg/m2 41.39 kg/m2 40.84 kg/m2 42.25 kg/m2

## 2024-05-05 NOTE — Assessment & Plan Note (Signed)
 Weekly supplement required

## 2024-05-05 NOTE — Assessment & Plan Note (Signed)
 Colonoscopy due , pt referred

## 2024-05-05 NOTE — Assessment & Plan Note (Signed)
 Hyperlipidemia:Low fat diet discussed and encouraged.   Lipid Panel  Lab Results  Component Value Date   CHOL 194 04/16/2024   HDL 50 04/16/2024   LDLCALC 131 (H) 04/16/2024   TRIG 72 04/16/2024   CHOLHDL 3.7 09/12/2023     Needs to reduce dietsry fat

## 2024-05-05 NOTE — Progress Notes (Signed)
 TAJI SATHER     MRN: [id number]      DOB: Jan 27, 1956  Chief Complaint  Patient presents with   Hypertension    4 month follow up     HPI Megan Morales is here for follow up and re-evaluation of chronic medical conditions, medication management and review of any available recent lab and radiology data.  Preventive health is updated, specifically  Cancer screening and Immunization.   Questions or concerns regarding consultations or procedures which the PT has had in the interim are  addressed. The PT denies any adverse reactions to current medications since the last visit.  There are no new concerns.  There are no specific complaints   ROS Denies recent fever or chills. Denies sinus pressure, nasal congestion, ear pain or sore throat. Denies chest congestion, productive cough or wheezing. Denies chest pains, palpitations and leg swelling Denies abdominal pain, nausea, vomiting,diarrhea or constipation.   Denies dysuria, frequency, hesitancy or incontinence. Denies joint pain, swelling and limitation in mobility. Denies headaches, seizures, numbness, or tingling. Denies depression, anxiety or insomnia. Denies skin break down or rash.   PE  BP 123/81   Pulse 77   Resp 16   Ht 5' 1 (1.549 m)   Wt 221 lb 0.6 oz (100.3 kg)   SpO2 95%   BMI 41.77 kg/m   Patient alert and oriented and in no cardiopulmonary distress.  HEENT: No facial asymmetry, EOMI,     Neck supple .  Chest: Clear to auscultation bilaterally.  CVS: S1, S2 no murmurs, no S3.Regular rate.  ABD: Soft non tender.   Ext: No edema  MS: Adequate ROM spine, shoulders, hips and knees.  Skin: Intact, no ulcerations or rash noted.  Psych: Good eye contact, normal affect. Memory intact not anxious or depressed appearing.  CNS: CN 2-12 intact, power,  normal throughout.no focal deficits noted.   Assessment & Plan  Essential hypertension, benign Controlled, no change in medication DASH diet and  commitment to daily physical activity for a minimum of 30 minutes discussed and encouraged, as a part of hypertension management. The importance of attaining a healthy weight is also discussed.     04/16/2024    9:40 AM 02/28/2024    8:58 AM 12/12/2023    9:34 AM 10/06/2023    9:09 AM 09/12/2023    9:48 AM 08/09/2023    2:58 PM 07/13/2023    9:11 AM  BP/Weight  Systolic BP 123 141 137 136 126  160  Diastolic BP 81 85 87 85 84  100  Wt. (Lbs) 221.04 220 216.08 219.04 216.12 223.6   BMI 41.77 kg/m2 41.57 kg/m2 40.83 kg/m2 41.39 kg/m2 40.84 kg/m2 42.25 kg/m2        Dyslipidemia Hyperlipidemia:Low fat diet discussed and encouraged.   Lipid Panel  Lab Results  Component Value Date   CHOL 194 04/16/2024   HDL 50 04/16/2024   LDLCALC 131 (H) 04/16/2024   TRIG 72 04/16/2024   CHOLHDL 3.7 09/12/2023     Needs to reduce dietsry fat  Morbid obesity (HCC)  Patient re-educated about  the importance of commitment to a  minimum of 150 minutes of exercise per week as able.  The importance of healthy food choices with portion control discussed, as well as eating regularly and within a 12 hour window most days. The need to choose clean , green food 50 to 75% of the time is discussed, as well as to make water the primary drink and set  a goal of 64 ounces water daily.       04/16/2024    9:40 AM 02/28/2024    8:58 AM 12/12/2023    9:34 AM  Weight /BMI  Weight 221 lb 0.6 oz 220 lb 216 lb 1.3 oz  Height 5' 1 (1.549 m) 5' 1 (1.549 m) 5' 1 (1.549 m)  BMI 41.77 kg/m2 41.57 kg/m2 40.83 kg/m2    Unchanged  Prediabetes Patient educated about the importance of limiting  Carbohydrate intake , the need to commit to daily physical activity for a minimum of 30 minutes , and to commit weight loss. The fact that changes in all these areas will reduce or eliminate all together the development of diabetes is stressed.  Improved continue ,metformin  and work on lifestyle more  consistently      Latest Ref Rng & Units 04/16/2024   10:22 AM 12/12/2023   10:42 AM 09/12/2023   10:28 AM 05/16/2023    6:29 PM 05/16/2023    6:22 PM  Diabetic Labs  HbA1c 4.8 - 5.6 % 5.7   5.9     Chol 100 - 199 mg/dL 805   808     HDL >60 mg/dL 50   52     Calc LDL 0 - 99 mg/dL 868   872     Triglycerides 0 - 149 mg/dL 72   63     Creatinine 0.57 - 1.00 mg/dL 8.96  9.17  9.06  9.09  0.93       04/16/2024    9:40 AM 02/28/2024    8:58 AM 12/12/2023    9:34 AM 10/06/2023    9:09 AM 09/12/2023    9:48 AM 08/09/2023    2:58 PM 07/13/2023    9:11 AM  BP/Weight  Systolic BP 123 141 137 136 126  160  Diastolic BP 81 85 87 85 84  100  Wt. (Lbs) 221.04 220 216.08 219.04 216.12 223.6   BMI 41.77 kg/m2 41.57 kg/m2 40.83 kg/m2 41.39 kg/m2 40.84 kg/m2 42.25 kg/m2        No data to display            Vitamin D  deficiency Weekly supplement required  Screening for colorectal cancer Colonoscopy due , pt referred   Metabolic syndrome X The increased risk of cardiovascular disease associated with this diagnosis, and the need to consistently work on lifestyle to change this is discussed. Following  a  heart healthy diet ,commitment to 30 minutes of exercise at least 5 days per week, as well as control of blood sugar and cholesterol , and achieving a healthy weight are all the areas to be addressed .

## 2024-05-05 NOTE — Assessment & Plan Note (Signed)
 Patient educated about the importance of limiting  Carbohydrate intake , the need to commit to daily physical activity for a minimum of 30 minutes , and to commit weight loss. The fact that changes in all these areas will reduce or eliminate all together the development of diabetes is stressed.  Improved continue ,metformin  and work on lifestyle more  consistently     Latest Ref Rng & Units 04/16/2024   10:22 AM 12/12/2023   10:42 AM 09/12/2023   10:28 AM 05/16/2023    6:29 PM 05/16/2023    6:22 PM  Diabetic Labs  HbA1c 4.8 - 5.6 % 5.7   5.9     Chol 100 - 199 mg/dL 805   808     HDL >60 mg/dL 50   52     Calc LDL 0 - 99 mg/dL 868   872     Triglycerides 0 - 149 mg/dL 72   63     Creatinine 0.57 - 1.00 mg/dL 8.96  9.17  9.06  9.09  0.93       04/16/2024    9:40 AM 02/28/2024    8:58 AM 12/12/2023    9:34 AM 10/06/2023    9:09 AM 09/12/2023    9:48 AM 08/09/2023    2:58 PM 07/13/2023    9:11 AM  BP/Weight  Systolic BP 123 141 137 136 126  160  Diastolic BP 81 85 87 85 84  100  Wt. (Lbs) 221.04 220 216.08 219.04 216.12 223.6   BMI 41.77 kg/m2 41.57 kg/m2 40.83 kg/m2 41.39 kg/m2 40.84 kg/m2 42.25 kg/m2        No data to display

## 2024-05-05 NOTE — Assessment & Plan Note (Signed)
 The increased risk of cardiovascular disease associated with this diagnosis, and the need to consistently work on lifestyle to change this is discussed. Following  a  heart healthy diet ,commitment to 30 minutes of exercise at least 5 days per week, as well as control of blood sugar and cholesterol , and achieving a healthy weight are all the areas to be addressed .

## 2024-05-05 NOTE — Assessment & Plan Note (Signed)
  Patient re-educated about  the importance of commitment to a  minimum of 150 minutes of exercise per week as able.  The importance of healthy food choices with portion control discussed, as well as eating regularly and within a 12 hour window most days. The need to choose clean , green food 50 to 75% of the time is discussed, as well as to make water the primary drink and set a goal of 64 ounces water daily.       04/16/2024    9:40 AM 02/28/2024    8:58 AM 12/12/2023    9:34 AM  Weight /BMI  Weight 221 lb 0.6 oz 220 lb 216 lb 1.3 oz  Height 5' 1 (1.549 m) 5' 1 (1.549 m) 5' 1 (1.549 m)  BMI 41.77 kg/m2 41.57 kg/m2 40.83 kg/m2    Unchanged

## 2024-06-18 ENCOUNTER — Encounter: Payer: Self-pay | Admitting: Family Medicine

## 2024-09-02 ENCOUNTER — Ambulatory Visit (INDEPENDENT_AMBULATORY_CARE_PROVIDER_SITE_OTHER): Admitting: Otolaryngology

## 2024-09-03 ENCOUNTER — Ambulatory Visit (INDEPENDENT_AMBULATORY_CARE_PROVIDER_SITE_OTHER): Admitting: Otolaryngology

## 2024-09-03 ENCOUNTER — Encounter (INDEPENDENT_AMBULATORY_CARE_PROVIDER_SITE_OTHER): Payer: Self-pay | Admitting: Otolaryngology

## 2024-09-03 VITALS — BP 139/92 | HR 95 | Ht 61.0 in

## 2024-09-03 DIAGNOSIS — E041 Nontoxic single thyroid nodule: Secondary | ICD-10-CM

## 2024-09-03 NOTE — Progress Notes (Signed)
 Dear Dr. Antonetta, Here is my assessment for our mutual patient, Megan Morales. Thank you for allowing me the opportunity to care for your patient. Please do not hesitate to contact me should you have any other questions. Sincerely, Dr. Eldora Blanch  Otolaryngology Clinic Note Referring provider: Dr. Antonetta HPI:  Megan Morales is a 68 y.o. female kindly referred by Dr. Antonetta for evaluation of thyroid  nodule  Initial visit (02/2024): Goiter: noted swelling to left side of neck in fall of 2024, went to UC and then ED for evaluation where she was noted to have left thyroid  cyst. Saw Dr. Luciano who rec FNA/aspiration but did not go through with it Notes waxes and wanes in size. No pain. No other neck masses Compressive symptoms: denies trouble breathing, or speech. Rare intermittent dysphagia/compression when she lays down Hypo or hyperthyroid symptoms: denies Tobacco: no  Prior evaluation has included: labs, US , CT  History of radiation to H&N: no Family history of thyroid  cancer: no  --------------------------------------------------------- 09/03/2024 She reports that she is doing well from neck symptoms -- no dysphagia, no trouble breathing, no changes in her voice. She does not think things are getting bigger or any pressure. She did not have the FNA done but she is interested in having the cyst drained.   H&N Surgery: no Personal or FHx of bleeding dz or anesthesia difficulty: no  GLP-1: no AP/AC: no  Tobacco: no  PMHx: PreDM, HLD, HTN  Independent Review of Additional Tests or Records:  Dr. Luciano (ENT): left thyroid  cyst, no biopsy; noted neck swelling, no sx; Dx: Thyroid  cyst; Rx: FNA TSH and FT4 (12/12/2023): TSH 0.582; FT4: 1.15 Dr. Antonetta notes (12/12/2023): noted thyroid  cyst, increased in size; Rx: ref to ENT CT Neck 05/16/2023: independently interpreted - large left thyroid  cyst with mild deviation of airway left to right; right sided nodule as well; no worrisome  lymphadenopathy noted - small scattered b/l level 2 nodes  US  Thyroid  05/2023 independently interpreted: agree with read  PMH/Meds/All/SocHx/FamHx/ROS:   Past Medical History:  Diagnosis Date   Hypertension      Past Surgical History:  Procedure Laterality Date   BREAST BIOPSY Right 03/26/2018   x2   BREAST BIOPSY Right 10/10/2018   KNEE ARTHROSCOPY WITH MEDIAL MENISECTOMY Left 04/30/2020   Procedure: KNEE ARTHROSCOPY WITH MEDIAL MENISECTOMY;  Surgeon: Margrette Taft BRAVO, MD;  Location: [facility];  Service: Orthopedics;  Laterality: Left;   MYOMECTOMY  2010   TUBAL LIGATION  1992    Family History  Problem Relation Age of Onset   Breast cancer Mother 59   Diabetes Father    Stroke Father    Hypertension Father    Cancer Brother        liver   Hypertension Brother    Colon cancer Neg Hx    Esophageal cancer Neg Hx    Pancreatic cancer Neg Hx    Rectal cancer Neg Hx    Stomach cancer Neg Hx      Social Connections: Not on file      Current Outpatient Medications:    amLODipine  (NORVASC ) 10 MG tablet, Take 1 tablet (10 mg total) by mouth daily., Disp: 90 tablet, Rfl: 3   Calcium Carbonate-Vitamin D  600-400 MG-UNIT tablet, Take 1 tablet by mouth daily., Disp: , Rfl:    carvedilol  (COREG ) 3.125 MG tablet, Take 1 tablet (3.125 mg total) by mouth 2 (two) times daily with a meal., Disp: 180 tablet, Rfl: 3   metFORMIN  (GLUCOPHAGE ) 500 MG tablet,  Take 1 tablet (500 mg total) by mouth 2 (two) times daily with a meal., Disp: 180 tablet, Rfl: 3   Multiple Vitamin (MULTIVITAMIN) capsule, Take 1 capsule by mouth daily., Disp: , Rfl:    olmesartan -hydrochlorothiazide  (BENICAR  HCT) 40-25 MG tablet, Take 1 tablet by mouth daily., Disp: 90 tablet, Rfl: 3   potassium chloride  (KLOR-CON ) 10 MEQ tablet, TAKE 1 TABLET(10 MEQ) BY MOUTH DAILY, Disp: 90 tablet, Rfl: 3   Vitamin D , Ergocalciferol , (DRISDOL ) 1.25 MG (50000 UNIT) CAPS capsule, Take 1 capsule (50,000 Units total) by mouth every 7  (seven) days., Disp: 12 capsule, Rfl: 2  Current Facility-Administered Medications:    methylPREDNISolone  acetate (DEPO-MEDROL ) injection 40 mg, 40 mg, Intramuscular, Once,    Physical Exam:   BP (!) 139/92 (BP Location: Left Arm, Patient Position: Sitting, Cuff Size: Large)   Pulse 95   Ht 5' 1 (1.549 m)   SpO2 95%   BMI 41.77 kg/m   Salient findings:  CN II-XII intact  Bilateral EAC clear and TM intact with well pneumatized middle ear spaces Anterior rhinoscopy: Septum intact; bilateral inferior turbinates without significant hypertrophy No lesions of oral cavity/oropharynx No obviously palpable neck masses/lymphadenopathy/thyromegaly EXCEPT: soft left neck thyromegaly consistent with prior noted thyroid  cyst, does not appear to extend inferior to clavicle; no other palpable nodules No respiratory distress or stridor; easily lays flat; voice quality class 1.5   Seprately Identifiable Procedures:  Prior to initiating any procedures, risks/benefits/alternatives were explained to the patient and verbal consent obtained. Procedure Note (Prior, not today) Pre-procedure diagnosis: Thyroid  nodule, mass effect on airway, rule out airway compromise Post-procedure diagnosis: Same Procedure: Transnasal Fiberoptic Laryngoscopy, CPT 31575 - Mod 25 Indication: see above Complications: None apparent EBL: 0 mL  The procedure was undertaken to further evaluate the patient's complaint above, with mirror exam inadequate for appropriate examination due to gag reflex and poor patient tolerance  Procedure:  Patient was identified as correct patient. Verbal consent was obtained. The nose was sprayed with oxymetazoline and 4% lidocaine . The The flexible laryngoscope was passed through the nose to view the nasal cavity, pharynx (oropharynx, hypopharynx) and larynx.  The larynx was examined at rest and during multiple phonatory tasks. Documentation was obtained and reviewed with patient. The scope was  removed. The patient tolerated the procedure well.  Findings: The nasal cavity and nasopharynx did not reveal any masses or lesions, mucosa appeared to be without obvious lesions. The tongue base, pharyngeal walls, piriform sinuses, vallecula, epiglottis and postcricoid region are normal in appearance EXCEPT: effacement of right pyriform and AE fold likely from left thyroid  lesion. The visualized portion of the subglottis and proximal trachea is widely patent. The vocal folds are mobile bilaterally. There are no lesions on the free edge of the vocal folds nor elsewhere in the larynx worrisome for malignancy.      Electronically signed by: Eldora KATHEE Blanch, MD 09/03/2024 9:02 AM   Impression & Plans:  Megan Morales is a 68 y.o. female with:  1. Thyroid  cyst   2. Thyroid  nodule    Noted multinodular goiter since at least fall 2024. No compressive symptoms. No prior FNA. Appears to be relatively simple cyst on US  without worrisome features. We discussed options including observation, FNA, v/s lobectomy.  She is interested in aspiration which I have ordered.  Will proceed with FNA for the cyst with aspiration and the TR 4 nodule on right  She wishes to call me once the FNA is done to go over results and  that is reasonable. If compressive sx develop or other issues, advised to call back. If recurs, can send for ablation perhaps  See below regarding exact medications prescribed this encounter including dosages and route: No orders of the defined types were placed in this encounter.     Thank you for allowing me the opportunity to care for your patient. Please do not hesitate to contact me should you have any other questions.  Sincerely, Eldora Blanch, MD Otolaryngologist (ENT), [facility] Phone: [phone] Fax: [phone]  09/03/2024, 9:02 AM   I have personally spent 30 minutes involved in face-to-face and non-face-to-face activities for this patient on the day of  the visit.  Professional time spent excludes any procedures performed but includes the following activities, in addition to those noted in the documentation: preparing to see the patient (review of outside documentation and results), performing a medically appropriate examination, counseling,, documenting in the electronic health record

## 2024-09-16 ENCOUNTER — Encounter: Payer: Self-pay | Admitting: Internal Medicine

## 2024-09-18 ENCOUNTER — Other Ambulatory Visit: Payer: Self-pay | Admitting: Family Medicine

## 2024-09-18 ENCOUNTER — Ambulatory Visit: Admitting: Family Medicine

## 2024-09-18 ENCOUNTER — Encounter: Payer: Self-pay | Admitting: Family Medicine

## 2024-09-18 VITALS — BP 130/90 | HR 82 | Resp 16 | Ht 61.0 in | Wt 222.1 lb

## 2024-09-18 DIAGNOSIS — Z9189 Other specified personal risk factors, not elsewhere classified: Secondary | ICD-10-CM

## 2024-09-18 DIAGNOSIS — E559 Vitamin D deficiency, unspecified: Secondary | ICD-10-CM | POA: Diagnosis not present

## 2024-09-18 DIAGNOSIS — E785 Hyperlipidemia, unspecified: Secondary | ICD-10-CM

## 2024-09-18 DIAGNOSIS — Z0001 Encounter for general adult medical examination with abnormal findings: Secondary | ICD-10-CM | POA: Diagnosis not present

## 2024-09-18 DIAGNOSIS — R7303 Prediabetes: Secondary | ICD-10-CM

## 2024-09-18 DIAGNOSIS — Z23 Encounter for immunization: Secondary | ICD-10-CM | POA: Diagnosis not present

## 2024-09-18 DIAGNOSIS — G8929 Other chronic pain: Secondary | ICD-10-CM

## 2024-09-18 DIAGNOSIS — M25562 Pain in left knee: Secondary | ICD-10-CM | POA: Diagnosis not present

## 2024-09-18 DIAGNOSIS — I10 Essential (primary) hypertension: Secondary | ICD-10-CM

## 2024-09-18 DIAGNOSIS — Z9889 Other specified postprocedural states: Secondary | ICD-10-CM | POA: Diagnosis not present

## 2024-09-18 DIAGNOSIS — Z1231 Encounter for screening mammogram for malignant neoplasm of breast: Secondary | ICD-10-CM

## 2024-09-18 DIAGNOSIS — R748 Abnormal levels of other serum enzymes: Secondary | ICD-10-CM | POA: Insufficient documentation

## 2024-09-18 MED ORDER — POTASSIUM 99 MG PO TABS: ORAL_TABLET | ORAL | 3 refills | Status: AC

## 2024-09-18 NOTE — Patient Instructions (Addendum)
 F/u in 4 months  TdAP and flu vaccine today  You need Pneumonia and covid vaccines, please getthem over the next 2 to 3 weeks  Pls schedule annual screening mammogram May 28 or after at [facility] at checkout  Fasting lipid, cmp and EGFR, hbA1C, TsH and free T4 and vit D today  You are being referred to Dr Margrette  re left knee  You will be referred for coronary calcium score  Think about what you will eat, plan ahead. Choose  clean, green, fresh or frozen over canned, processed or packaged foods which are more sugary, salty and fatty. 70 to 75% of food eaten should be vegetables and fruit. Three meals at set times with snacks allowed between meals, but they must be fruit or vegetables. Aim to eat over a 12 hour period , example 7 am to 7 pm, and STOP after  your last meal of the day. Drink water,generally about 64 ounces per day, no other drink is as healthy. Fruit juice is best enjoyed in a healthy way, by EATING the fruit.  It is important that you exercise regularly at least 30 minutes 5 times a week. If you develop chest pain, have severe difficulty breathing, or feel very tired, stop exercising immediately and seek medical attention   Thanks for choosing [facility], we consider it a privelige to serve you.

## 2024-09-18 NOTE — Progress Notes (Unsigned)
° ° °  Megan Morales     MRN: [id number]      DOB: 11-08-1955  Chief Complaint  Patient presents with   Annual Exam    Cpe     HPI: Patient is in for annual physical exam. No other health concerns are expressed or addressed at the visit. Recent labs,  are reviewed. Immunization is reviewed , and  updated if needed.   PE: Pleasant  female, alert and oriented x 3, in no cardio-pulmonary distress. Afebrile. HEENT No facial trauma or asymetry. Sinuses non tender.  Extra occullar muscles intact.. External ears normal, . Neck: supple, no adenopathy,JVD or thyromegaly.No bruits.  Chest: Clear to ascultation bilaterally.No crackles or wheezes. Non tender to palpation  Breast: No asymetry,no masses or lumps. No tenderness. No nipple discharge or inversion. No axillary or supraclavicular adenopathy  Cardiovascular system; Heart sounds normal,  S1 and  S2 ,no S3.  No murmur, or thrill. Apical beat not displaced Peripheral pulses normal.  Abdomen: Soft, non tender, no organomegaly or masses. No bruits. Bowel sounds normal. No guarding, tenderness or rebound.   GU: External genitalia normal female genitalia , normal female distribution of hair. No lesions. Urethral meatus normal in size, no  Prolapse, no lesions visibly  Present. Bladder non tender. Vagina pink and moist , with no visible lesions , discharge present . Adequate pelvic support no  cystocele or rectocele noted Cervix pink and appears healthy, no lesions or ulcerations noted, no discharge noted from os Uterus normal size, no adnexal masses, no cervical motion or adnexal tenderness.   Musculoskeletal exam: Full ROM of spine, hips , shoulders and knees. No deformity ,swelling or crepitus noted. No muscle wasting or atrophy.   Neurologic: Cranial nerves 2 to 12 intact. Power, tone ,sensation and reflexes normal throughout. No disturbance in gait. No tremor.  Skin: Intact, no ulceration, erythema , scaling  or rash noted. Pigmentation normal throughout  Psych; Normal mood and affect. Judgement and concentration normal   Assessment & Plan:  No problem-specific Assessment & Plan notes found for this encounter.

## 2024-09-19 ENCOUNTER — Ambulatory Visit: Payer: Self-pay | Admitting: Family Medicine

## 2024-09-19 LAB — CMP14+EGFR
ALT: 15 IU/L (ref 0–32)
AST: 13 IU/L (ref 0–40)
Albumin: 4.2 g/dL (ref 3.9–4.9)
Alkaline Phosphatase: 144 IU/L — ABNORMAL HIGH (ref 49–135)
BUN/Creatinine Ratio: 15 (ref 12–28)
BUN: 16 mg/dL (ref 8–27)
Bilirubin Total: 0.4 mg/dL (ref 0.0–1.2)
CO2: 28 mmol/L (ref 20–29)
Calcium: 9.7 mg/dL (ref 8.7–10.3)
Chloride: 101 mmol/L (ref 96–106)
Creatinine, Ser: 1.04 mg/dL — ABNORMAL HIGH (ref 0.57–1.00)
Globulin, Total: 3.2 g/dL (ref 1.5–4.5)
Glucose: 80 mg/dL (ref 70–99)
Potassium: 4.3 mmol/L (ref 3.5–5.2)
Sodium: 141 mmol/L (ref 134–144)
Total Protein: 7.4 g/dL (ref 6.0–8.5)
eGFR: 59 mL/min/1.73 — ABNORMAL LOW (ref >59)

## 2024-09-19 LAB — LIPID PANEL
Chol/HDL Ratio: 3.4 ratio (ref 0.0–4.4)
Cholesterol, Total: 206 mg/dL — ABNORMAL HIGH (ref 100–199)
HDL: 60 mg/dL (ref >39)
LDL Chol Calc (NIH): 135 mg/dL — ABNORMAL HIGH (ref 0–99)
Triglycerides: 61 mg/dL (ref 0–149)
VLDL Cholesterol Cal: 11 mg/dL (ref 5–40)

## 2024-09-19 LAB — HEMOGLOBIN A1C
Est. average glucose Bld gHb Est-mCnc: 123 mg/dL
Hgb A1c MFr Bld: 5.9 % — ABNORMAL HIGH (ref 4.8–5.6)

## 2024-09-19 LAB — VITAMIN D 25 HYDROXY (VIT D DEFICIENCY, FRACTURES): Vit D, 25-Hydroxy: 37.6 ng/mL (ref 30.0–100.0)

## 2024-09-19 LAB — TSH+FREE T4
Free T4: 1.13 ng/dL (ref 0.82–1.77)
TSH: 0.946 u[IU]/mL (ref 0.450–4.500)

## 2024-09-22 NOTE — Assessment & Plan Note (Signed)
 Increased pain and instability in past 6 months, Ortho re eval needed Weight l loss encouraged also

## 2024-09-22 NOTE — Assessment & Plan Note (Signed)
 After obtaining informed consent, the TdAP and influenza  vaccines are   administered , with no adverse effect noted at the time of administration.

## 2024-09-22 NOTE — Assessment & Plan Note (Signed)
Annual exam as documented. Counseling done  re healthy lifestyle involving commitment to 150 minutes exercise per week, heart healthy diet, and attaining healthy weight.The importance of adequate sleep also discussed. Changes in health habits are decided on by the patient with goals and time frames  set for achieving them. Immunization and cancer screening needs are specifically addressed at this visit. 

## 2024-10-08 ENCOUNTER — Encounter

## 2024-10-08 VITALS — Ht 61.0 in | Wt 222.0 lb

## 2024-10-08 DIAGNOSIS — Z1211 Encounter for screening for malignant neoplasm of colon: Secondary | ICD-10-CM

## 2024-10-08 MED ORDER — NA SULFATE-K SULFATE-MG SULF 17.5-3.13-1.6 GM/177ML PO SOLN: 1.0000 | Freq: Once | ORAL | 0 refills | Status: AC

## 2024-10-08 NOTE — Progress Notes (Signed)
 RN confirmed patient name, date of birth, and address RN confirmed date and time of procedure RN reviewed and confirmed allergies  RN reviewed and updated current medications; confirmed preferred pharmacy Pt is not on diet pills nor GLP-1 medications Pt is not on blood thinners RN reviewed medical & surgical hx  Pt denies issues with constipation  Diabetic - no No A fib or A flutter No cardiac tests are pending  Pt is not on home 02  No issues known with past sedation with any surgeries or procedures Patient denies ever being told they had issues or difficulty with intubation  Patient unaware of any fh of malignant hyperthermia Ambulates independently RN reviewed prep instructions and explained time frames for holding certain medications RN answered patient questions; patient stated understanding Prep instructions sent

## 2024-10-14 ENCOUNTER — Encounter: Payer: Self-pay | Admitting: Internal Medicine

## 2024-10-22 ENCOUNTER — Ambulatory Visit: Admitting: Internal Medicine

## 2024-10-22 ENCOUNTER — Encounter: Payer: Self-pay | Admitting: Internal Medicine

## 2024-10-22 VITALS — BP 152/97 | HR 85 | Temp 97.2°F | Resp 29 | Ht 61.0 in | Wt 222.0 lb

## 2024-10-22 DIAGNOSIS — D125 Benign neoplasm of sigmoid colon: Secondary | ICD-10-CM

## 2024-10-22 DIAGNOSIS — K648 Other hemorrhoids: Secondary | ICD-10-CM | POA: Diagnosis not present

## 2024-10-22 DIAGNOSIS — D122 Benign neoplasm of ascending colon: Secondary | ICD-10-CM | POA: Diagnosis not present

## 2024-10-22 DIAGNOSIS — D124 Benign neoplasm of descending colon: Secondary | ICD-10-CM | POA: Diagnosis not present

## 2024-10-22 DIAGNOSIS — D128 Benign neoplasm of rectum: Secondary | ICD-10-CM | POA: Diagnosis not present

## 2024-10-22 DIAGNOSIS — Z1211 Encounter for screening for malignant neoplasm of colon: Secondary | ICD-10-CM | POA: Diagnosis present

## 2024-10-22 DIAGNOSIS — D123 Benign neoplasm of transverse colon: Secondary | ICD-10-CM

## 2024-10-22 MED ORDER — SODIUM CHLORIDE 0.9 % IV SOLN: 500.0000 mL | INTRAVENOUS | Status: DC

## 2024-10-22 NOTE — Progress Notes (Signed)
 "   GASTROENTEROLOGY PROCEDURE H&P NOTE   Primary Care Physician: Antonetta Rollene BRAVO, MD    Reason for Procedure:   Colon cancer screening  Plan:    Colonoscopy  Patient is appropriate for endoscopic procedure(s) in the ambulatory ([facility]) setting.  The nature of the procedure, as well as the risks, benefits, and alternatives were carefully and thoroughly reviewed with the patient. Ample time for discussion and questions allowed. The patient understood, was satisfied, and agreed to proceed.     HPI: Megan Morales is a 68 y.o. female who presents for colonoscopy for colon cancer screening. Denies blood in stools, changes in bowel habits, or unintentional weight loss. Denies family history of colon cancer.  Colonoscopy 08/18/14: A pedunculated polyp measuring 10 mm in size was found in the transverse colon. A polypectomy was performed using snare cautery. The resection was complete, the polyp tissue was completely retrieved and sent to histology. The examination was otherwise normal. Retroflexed views revealed internal Grade II hemorrhoids. Path: GRANULATION-TYPE GRANULATION-TYPE POLYP WITH PROLAPSE PROLAPSE FEATURES. FEATURES. - NO DYSPLASIA DYSPLASIA OR MALIGNANCY MALIGNANCY IDENTIFIED. IDENTIFIED. - SEE COMMENT. COMMENT. Microscopic Microscopic Comment Comment The polyp is acute and chronically chronically inflamed inflamed and is consistent consistent with a granulation-type granulation-type polyp. polyp. Dr. Loral has seen this case in consultation consultation with essential essential agreement. agreement. (RAH:gt, (RAH:gt, 08/21/14)  Past Medical History:  Diagnosis Date   Hypertension     Past Surgical History:  Procedure Laterality Date   BREAST BIOPSY Right 03/26/2018   x2   BREAST BIOPSY Right 10/10/2018   COLONOSCOPY     KNEE ARTHROSCOPY WITH MEDIAL MENISECTOMY Left 04/30/2020   Procedure: KNEE ARTHROSCOPY WITH MEDIAL MENISECTOMY;  Surgeon: Margrette Taft BRAVO, MD;   Location: [facility];  Service: Orthopedics;  Laterality: Left;   MYOMECTOMY  2010   TUBAL LIGATION  1992    Prior to Admission medications  Medication Sig Start Date End Date Taking? Authorizing Provider  amLODipine  (NORVASC ) 10 MG tablet Take 1 tablet (10 mg total) by mouth daily. 04/16/24   Antonetta Rollene BRAVO, MD  Calcium Carbonate-Vitamin D  600-400 MG-UNIT tablet Take 1 tablet by mouth daily.    [provider]  metFORMIN  (GLUCOPHAGE ) 500 MG tablet Take 1 tablet (500 mg total) by mouth 2 (two) times daily with a meal. Patient taking differently: Take 500 mg by mouth daily. 04/16/24   Antonetta Rollene BRAVO, MD  Multiple Vitamin (MULTIVITAMIN) capsule Take 1 capsule by mouth daily.    [provider]  olmesartan -hydrochlorothiazide  (BENICAR  HCT) 40-25 MG tablet Take 1 tablet by mouth daily. 04/16/24   Antonetta Rollene BRAVO, MD  Potassium 99 MG TABS Take two tablets by mouth daily 09/18/24   Antonetta Rollene BRAVO, MD  potassium chloride  (KLOR-CON ) 10 MEQ tablet TAKE 1 TABLET(10 MEQ) BY MOUTH DAILY Patient not taking: Reported on 10/08/2024 04/16/24   Antonetta Rollene BRAVO, MD  Vitamin D , Ergocalciferol , (DRISDOL ) 1.25 MG (50000 UNIT) CAPS capsule Take 1 capsule (50,000 Units total) by mouth every 7 (seven) days. 12/14/23   Antonetta Rollene BRAVO, MD    Current Outpatient Medications  Medication Sig Dispense Refill   amLODipine  (NORVASC ) 10 MG tablet Take 1 tablet (10 mg total) by mouth daily. 90 tablet 3   Calcium Carbonate-Vitamin D  600-400 MG-UNIT tablet Take 1 tablet by mouth daily.     metFORMIN  (GLUCOPHAGE ) 500 MG tablet Take 1 tablet (500 mg total) by mouth 2 (two) times daily with a meal. (Patient taking differently: Take 500  mg by mouth daily.) 180 tablet 3   Multiple Vitamin (MULTIVITAMIN) capsule Take 1 capsule by mouth daily.     olmesartan -hydrochlorothiazide  (BENICAR  HCT) 40-25 MG tablet Take 1 tablet by mouth daily. 90 tablet 3   Potassium 99 MG TABS Take two tablets by mouth  daily 180 tablet 3   potassium chloride  (KLOR-CON ) 10 MEQ tablet TAKE 1 TABLET(10 MEQ) BY MOUTH DAILY (Patient not taking: Reported on 10/08/2024) 90 tablet 3   Vitamin D , Ergocalciferol , (DRISDOL ) 1.25 MG (50000 UNIT) CAPS capsule Take 1 capsule (50,000 Units total) by mouth every 7 (seven) days. 12 capsule 2   Current Facility-Administered Medications  Medication Dose Route Frequency Provider Last Rate Last Admin   0.9 %  sodium chloride  infusion  500 mL Intravenous Continuous Federico Rosario BROCKS, MD       methylPREDNISolone  acetate (DEPO-MEDROL ) injection 40 mg  40 mg Intramuscular Once         Allergies as of 10/22/2024 - Review Complete 10/22/2024  Allergen Reaction Noted   Codeine Swelling 10/26/2007   Naproxen  Swelling 01/01/2018   Tramadol Other (See Comments) 09/15/2011    Family History  Problem Relation Age of Onset   Breast cancer Mother 30   Diabetes Father    Stroke Father    Hypertension Father    Cancer Brother        liver   Hypertension Brother    Colon cancer Neg Hx    Esophageal cancer Neg Hx    Pancreatic cancer Neg Hx    Rectal cancer Neg Hx    Stomach cancer Neg Hx    Colon polyps Neg Hx     Social History   Socioeconomic History   Marital status: Married    Spouse name: Not on file   Number of children: Not on file   Years of education: Not on file   Highest education level: Not on file  Occupational History   Not on file  Tobacco Use   Smoking status: Never   Smokeless tobacco: Never  Vaping Use   Vaping status: Never Used  Substance and Sexual Activity   Alcohol use: No    Alcohol/week: 0.0 standard drinks of alcohol   Drug use: No   Sexual activity: Not on file  Other Topics Concern   Not on file  Social History Narrative   Not on file   Social Drivers of Health   Tobacco Use: Low Risk (10/22/2024)   Patient History    Smoking Tobacco Use: Never    Smokeless Tobacco Use: Never    Passive Exposure: Not on file  Financial Resource  Strain: Not on file  Food Insecurity: Not on file  Transportation Needs: Not on file  Physical Activity: Not on file  Stress: Not on file  Social Connections: Not on file  Intimate Partner Violence: Not on file  Depression (PHQ2-9): Low Risk (09/18/2024)   Depression (PHQ2-9)    PHQ-2 Score: 0  Alcohol Screen: Not on file  Housing: Not on file  Utilities: Not on file  Health Literacy: Not on file    Physical Exam: Vital signs in last 24 hours: BP (!) 147/94   Pulse 83   Temp (!) 97.2 F (36.2 C) (Temporal)   Ht 5' 1 (1.549 m)   Wt 222 lb (100.7 kg)   SpO2 97%   BMI 41.95 kg/m  GEN: NAD EYE: Sclerae anicteric ENT: MMM CV: Non-tachycardic Pulm: No increased work of breathing GI: Soft, NT/ND NEURO:  Alert &  Oriented   Estefana Kidney, MD [facility]  10/22/2024 10:20 AM  "

## 2024-10-22 NOTE — Progress Notes (Signed)
 Sedate, gd SR, tolerated procedure well, VSS, report to RN

## 2024-10-22 NOTE — Op Note (Addendum)
 [facility] Patient Name: Megan Morales Procedure Date: 10/22/2024 11:05 AM MRN: [id number] Endoscopist: Rosario Estefana Kidney , , [id number] Age: 69 Referring MD:  Date of Birth: June 17, 1956 Gender: Female Account #: [id number] Procedure:                Colonoscopy Indications:              Screening for colorectal malignant neoplasm Medicines:                Monitored Anesthesia Care Procedure:                Pre-Anesthesia Assessment:                           - Prior to the procedure, a History and Physical                            was performed, and patient medications and                            allergies were reviewed. The patient's tolerance of                            previous anesthesia was also reviewed. The risks                            and benefits of the procedure and the sedation                            options and risks were discussed with the patient.                            All questions were answered, and informed consent                            was obtained. Prior Anticoagulants: The patient has                            taken no anticoagulant or antiplatelet agents. ASA                            Grade Assessment: II - A patient with mild systemic                            disease. After reviewing the risks and benefits,                            the patient was deemed in satisfactory condition to                            undergo the procedure.                           After obtaining informed consent, the colonoscope  was passed under direct vision. Throughout the                            procedure, the patient's blood pressure, pulse, and                            oxygen saturations were monitored continuously. The                            CF [id number] #[id number] was introduced through the anus                            and advanced to the the cecum, identified by                            appendiceal  orifice and ileocecal valve. The                            colonoscopy was performed without difficulty. The                            patient tolerated the procedure well. The quality                            of the bowel preparation was excellent. The                            ileocecal valve, appendiceal orifice, and rectum                            were photographed. Scope In: 11:21:04 AM Scope Out: 11:46:28 AM Scope Withdrawal Time: 0 hours 17 minutes 31 seconds  Total Procedure Duration: 0 hours 25 minutes 24 seconds  Findings:                 Three sessile polyps were found in the transverse                            colon and ascending colon. The polyps were 3 to 7                            mm in size. These polyps were removed with a cold                            snare. Resection and retrieval were complete.                           A 10 mm polyp was found in the transverse colon.                            The polyp was pedunculated. The polyp was removed                            with  a hot snare. Resection and retrieval were                            complete.                           Three sessile polyps were found in the rectum,                            sigmoid colon and descending colon. The polyps were                            3 to 6 mm in size. These polyps were removed with a                            cold snare. Resection and retrieval were complete.                           Non-bleeding internal hemorrhoids were found during                            retroflexion. Complications:            No immediate complications. Estimated Blood Loss:     Estimated blood loss was minimal. Impression:               - Three 3 to 7 mm polyps in the transverse colon                            and in the ascending colon, removed with a cold                            snare. Resected and retrieved.                           - One 10 mm polyp in the transverse colon,  removed                            with a hot snare. Resected and retrieved.                           - Three 3 to 6 mm polyps in the rectum, in the                            sigmoid colon and in the descending colon, removed                            with a cold snare. Resected and retrieved.                           - Non-bleeding internal hemorrhoids. Recommendation:           - Discharge patient to home (with escort).                           -  Await pathology results.                           - The findings and recommendations were discussed                            with the patient. Dr Estefana Federico Rosario Estefana Federico,  10/22/2024 11:54:23 AM

## 2024-10-22 NOTE — Progress Notes (Signed)
 Called to room to assist during endoscopic procedure.  Patient ID and intended procedure confirmed with present staff. Received instructions for my participation in the procedure from the performing physician.

## 2024-10-22 NOTE — Patient Instructions (Signed)
 YOU HAD AN ENDOSCOPIC PROCEDURE TODAY AT THE [facility]:   Refer to the procedure report that was given to you for any specific questions about what was found during the examination.  If the procedure report does not answer your questions, please call your gastroenterologist to clarify.  If you requested that your care partner not be given the details of your procedure findings, then the procedure report has been included in a sealed envelope for you to review at your convenience later.  YOU SHOULD EXPECT: Some feelings of bloating in the abdomen. Passage of more gas than usual.  Walking can help get rid of the air that was put into your GI tract during the procedure and reduce the bloating. If you had a lower endoscopy (such as a colonoscopy or flexible sigmoidoscopy) you may notice spotting of blood in your stool or on the toilet paper. If you underwent a bowel prep for your procedure, you may not have a normal bowel movement for a few days.  Please Note:  You might notice some irritation and congestion in your nose or some drainage.  This is from the oxygen used during your procedure.  There is no need for concern and it should clear up in a day or so.  SYMPTOMS TO REPORT IMMEDIATELY:  Following lower endoscopy (colonoscopy or flexible sigmoidoscopy):  Excessive amounts of blood in the stool  Significant tenderness or worsening of abdominal pains  Swelling of the abdomen that is new, acute  Fever of 100F or higher  Discharge to home Await pathology results Handout on polyps given   For urgent or emergent issues, a gastroenterologist can be reached at any hour by calling [phone]. Do not use MyChart messaging for urgent concerns.    DIET:  We do recommend a small meal at first, but then you may proceed to your regular diet.  Drink plenty of fluids but you should avoid alcoholic beverages for 24 hours.  ACTIVITY:  You should plan to take it easy for the rest of today  and you should NOT DRIVE or use heavy machinery until tomorrow (because of the sedation medicines used during the test).    FOLLOW UP: Our staff will call the number listed on your records the next business day following your procedure.  We will call around 7:15- 8:00 am to check on you and address any questions or concerns that you may have regarding the information given to you following your procedure. If we do not reach you, we will leave a message.     If any biopsies were taken you will be contacted by phone or by letter within the next 1-3 weeks.  Please call us  at [phone] if you have not heard about the biopsies in 3 weeks.    SIGNATURES/CONFIDENTIALITY: You and/or your care partner have signed paperwork which will be entered into your electronic medical record.  These signatures attest to the fact that that the information above on your After Visit Summary has been reviewed and is understood.  Full responsibility of the confidentiality of this discharge information lies with you and/or your care-partner.

## 2024-10-22 NOTE — Progress Notes (Signed)
 Pt's states no medical or surgical changes since previsit or office visit.

## 2024-10-23 ENCOUNTER — Telehealth: Payer: Self-pay | Admitting: *Deleted

## 2024-10-23 NOTE — Telephone Encounter (Signed)
" °  Follow up Call-     10/22/2024   10:08 AM  Call back number  Post procedure Call Back phone  # [phone]  Permission to leave phone message Yes     Patient questions:  Do you have a fever, pain , or abdominal swelling? No. Pain Score  0 *  Have you tolerated food without any problems? Yes.    Have you been able to return to your normal activities? Yes.    Do you have any questions about your discharge instructions: Diet   No. Medications  No. Follow up visit  No.  Do you have questions or concerns about your Care? No.  Actions: * If pain score is 4 or above: No action needed, pain <4.   "

## 2024-10-24 ENCOUNTER — Ambulatory Visit: Payer: Self-pay | Admitting: Internal Medicine

## 2024-10-24 LAB — SURGICAL PATHOLOGY

## 2024-10-31 ENCOUNTER — Ambulatory Visit (HOSPITAL_COMMUNITY)
Admission: RE | Admit: 2024-10-31 | Discharge: 2024-10-31 | Disposition: A | Discharge status: Home / Self Care | Payer: Self-pay | Source: Ambulatory Visit | Attending: Family Medicine | Admitting: Family Medicine

## 2024-10-31 DIAGNOSIS — Z9189 Other specified personal risk factors, not elsewhere classified: Secondary | ICD-10-CM | POA: Insufficient documentation

## 2024-10-31 DIAGNOSIS — R7303 Prediabetes: Secondary | ICD-10-CM | POA: Insufficient documentation

## 2024-10-31 DIAGNOSIS — E785 Hyperlipidemia, unspecified: Secondary | ICD-10-CM | POA: Insufficient documentation

## 2024-10-31 DIAGNOSIS — I10 Essential (primary) hypertension: Secondary | ICD-10-CM | POA: Insufficient documentation

## 2024-11-06 ENCOUNTER — Encounter: Payer: Self-pay | Admitting: Orthopedic Surgery

## 2024-11-06 ENCOUNTER — Ambulatory Visit: Admitting: Orthopedic Surgery

## 2024-11-06 ENCOUNTER — Other Ambulatory Visit: Payer: Self-pay

## 2024-11-06 VITALS — BP 153/112 | HR 86 | Ht 61.0 in | Wt 222.0 lb

## 2024-11-06 DIAGNOSIS — Z9889 Other specified postprocedural states: Secondary | ICD-10-CM

## 2024-11-06 DIAGNOSIS — G8929 Other chronic pain: Secondary | ICD-10-CM

## 2024-11-06 DIAGNOSIS — M1712 Unilateral primary osteoarthritis, left knee: Secondary | ICD-10-CM

## 2024-11-06 DIAGNOSIS — M5410 Radiculopathy, site unspecified: Secondary | ICD-10-CM

## 2024-11-06 MED ORDER — ROSUVASTATIN CALCIUM 5 MG PO TABS: 5.0000 mg | ORAL_TABLET | Freq: Every day | ORAL | 5 refills | Status: AC

## 2024-11-06 NOTE — Patient Instructions (Signed)
 Physical therapy has been ordered for you at [facility]. They should call you to schedule, [phone] is the phone number to call, if you want to call to schedule.

## 2024-11-06 NOTE — Progress Notes (Signed)
 "  Office Visit Note   Patient: Megan Morales           Date of Birth: 11/29/1955           MRN: [id number] Visit Date: 11/06/2024 Requested by: Antonetta Rollene BRAVO, MD [address], [address] [place],  [place] [address] PCP: Antonetta Rollene BRAVO, MD   Assessment & Plan:   Encounter Diagnoses  Name Primary?   Chronic pain of left knee Yes   S/P left knee arthroscopy 04/30/20    Primary osteoarthritis of left knee    Radicular syndrome of left leg     No orders of the defined types were placed in this encounter.   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  She comes in today complaining of left knee pain for the last 6 to 7 months with some popping in the knee and a feeling that the knee will give way.  The pain is located behind the knee and occasionally over the medial side of the soft tissues of the knee.  She gives a remote history of having a radicular pain syndrome in December 2024 where she was confined to the bed and had to have trouble getting up and had trouble walking on the left side.  She has not taken any medications for this particular episode of knee pain              ROS: As stated     Visit Diagnoses:  1. Chronic pain of left knee   2. S/P left knee arthroscopy 04/30/20   3. Primary osteoarthritis of left knee   4. Radicular syndrome of left leg      Follow-Up Instructions: No follow-ups on file.    Objective: Vital Signs: BP (!) 153/112   Pulse 86   Ht 5' 1 (1.549 m)   Wt 222 lb (100.7 kg)   BMI 41.95 kg/m   Physical Exam  General appearance normal grooming and hygiene  Patient oriented x  3  Mood pleasant affect normal  Left lower extremity examination crepitance on range of motion which still remains over 125 degrees of flexion stability noted in all planes normal strength no tenderness around the joint crepitance appears to be coming from the patellofemoral area skin is intact muscle tone normal  We did elicit tenderness on the lateral side of the leg up into the left hip and lower back with normal range of motion in terms of hip flexion no pain no peripheral edema distally.  She also has had some pain over the medial soft tissue sleeve and some swelling of the pes bursa  Ortho Exam   Specialty Comments:  No specialty comments available.  Imaging: DG Knee AP/LAT W/[facility] Left Result Date: 11/06/2024 Imaging report left knee posterior knee pain X-ray shows most likely narrowing of the medial compartment, there are some small osteophytes Valgus alignment of 4 degrees We see some degenerative changes in the patellofemoral joint with normal alignment Impression mild OA left knee     PMFS History: Patient Active Problem List   Diagnosis Date Noted   Elevated alkaline phosphatase level 09/18/2024  Colon cancer screening 05/05/2024   Hypokalemia 05/05/2024   Screening mammogram for breast cancer 12/12/2023   Lumbar pain 10/06/2023   Immunization due 07/18/2023   Multinodular goiter 07/18/2023   Thyroid  cyst 05/18/2023   Osteopenia 04/28/2023   Annual visit for general adult medical examination with abnormal findings 09/17/2022   Dermatomycosis 06/27/2022   Snoring 05/10/2021   S/P left knee arthroscopy 04/30/20 05/04/2020   Derangement of posterior horn of medial meniscus of left knee    Plantar fasciitis 01/31/2019   Osteoarthritis of both knees 01/05/2018   Morbid obesity (HCC) 01/08/2015   Dyslipidemia 08/18/2013   Metabolic syndrome X 08/18/2013   Vitamin D  deficiency 01/02/2013   Benign skin lesion of neck 12/31/2012   Prediabetes 04/07/2011   FIBROIDS,  UTERUS 10/26/2007   Essential hypertension, benign 10/26/2007   Past Medical History:  Diagnosis Date   Hypertension     Family History  Problem Relation Age of Onset   Breast cancer Mother 56   Diabetes Father    Stroke Father    Hypertension Father    Cancer Brother        liver   Hypertension Brother    Colon cancer Neg Hx    Esophageal cancer Neg Hx    Pancreatic cancer Neg Hx    Rectal cancer Neg Hx    Stomach cancer Neg Hx    Colon polyps Neg Hx     Past Surgical History:  Procedure Laterality Date   BREAST BIOPSY Right 03/26/2018   x2   BREAST BIOPSY Right 10/10/2018   COLONOSCOPY     KNEE ARTHROSCOPY WITH MEDIAL MENISECTOMY Left 04/30/2020   Procedure: KNEE ARTHROSCOPY WITH MEDIAL MENISECTOMY;  Surgeon: Margrette Taft BRAVO, MD;  Location: [facility];  Service: Orthopedics;  Laterality: Left;   MYOMECTOMY  2010   TUBAL LIGATION  1992   Social History   Occupational History   Not on file  Tobacco Use   Smoking status: Never   Smokeless tobacco: Never  Vaping Use   Vaping status: Never Used  Substance and Sexual Activity   Alcohol use: No    Alcohol/week: 0.0 standard drinks of alcohol   Drug use: No   Sexual activity: Not on file       "

## 2024-11-06 NOTE — Progress Notes (Signed)
" °  Intake history:  Chief Complaint  Patient presents with   Knee Pain    left     BP (!) 153/112   Pulse 86   Ht 5' 1 (1.549 m)   Wt 222 lb (100.7 kg)   BMI 41.95 kg/m  Body mass index is 41.95 kg/m.  Pharmacy? ____[facility]__  WHAT ARE WE SEEING YOU FOR TODAY?   Left knee  How long has this bothered you? (DOI?DOS?WS?)  approximately 6-7 month(s) ago  Was there an injury? No  Anticoag.  No   Any ALLERGIES _________Allergies[1] _____________________________________   Treatment:  Have you taken:  Tylenol  No  Advil  No  Had PT No  Had injection No  Other  _________________________        [1]  Allergies Allergen Reactions   Codeine Swelling   Naproxen  Swelling    Face and eyes    Tramadol Other (See Comments)    Weak and lightheaded; made me real sleepy; my coworkers had to drive me home   "

## 2024-11-20 ENCOUNTER — Ambulatory Visit

## 2025-01-17 ENCOUNTER — Ambulatory Visit: Payer: Self-pay | Admitting: Family Medicine

## 2025-01-24 ENCOUNTER — Ambulatory Visit: Admitting: Family Medicine

## 2025-02-25 ENCOUNTER — Ambulatory Visit
